# Patient Record
Sex: Male | Born: 1950 | Race: White | Hispanic: No | Marital: Married | Smoking: Former smoker
Health system: Southern US, Community
[De-identification: ages and names within clinical notes are randomized; demographics above are authoritative.]

## PROBLEM LIST (undated history)

## (undated) DIAGNOSIS — I10 Essential (primary) hypertension: Secondary | ICD-10-CM

## (undated) DIAGNOSIS — M1990 Unspecified osteoarthritis, unspecified site: Secondary | ICD-10-CM

## (undated) DIAGNOSIS — E785 Hyperlipidemia, unspecified: Secondary | ICD-10-CM

## (undated) DIAGNOSIS — N138 Other obstructive and reflux uropathy: Secondary | ICD-10-CM

## (undated) DIAGNOSIS — IMO0001 Reserved for inherently not codable concepts without codable children: Secondary | ICD-10-CM

## (undated) DIAGNOSIS — E118 Type 2 diabetes mellitus with unspecified complications: Secondary | ICD-10-CM

## (undated) DIAGNOSIS — N401 Enlarged prostate with lower urinary tract symptoms: Secondary | ICD-10-CM

## (undated) DIAGNOSIS — M24549 Contracture, unspecified hand: Secondary | ICD-10-CM

## (undated) DIAGNOSIS — N400 Enlarged prostate without lower urinary tract symptoms: Secondary | ICD-10-CM

## (undated) DIAGNOSIS — L409 Psoriasis, unspecified: Secondary | ICD-10-CM

## (undated) DIAGNOSIS — R0600 Dyspnea, unspecified: Secondary | ICD-10-CM

## (undated) DIAGNOSIS — H9190 Unspecified hearing loss, unspecified ear: Secondary | ICD-10-CM

## (undated) DIAGNOSIS — Z8619 Personal history of other infectious and parasitic diseases: Secondary | ICD-10-CM

## (undated) DIAGNOSIS — E119 Type 2 diabetes mellitus without complications: Secondary | ICD-10-CM

## (undated) HISTORY — PX: VASECTOMY: SHX75

## (undated) HISTORY — PX: SINUS SURGERY WITH INSTATRAK: SHX5215

## (undated) HISTORY — DX: Hyperlipidemia, unspecified: E78.5

---

## 2006-03-07 ENCOUNTER — Ambulatory Visit: Payer: Self-pay | Admitting: General Surgery

## 2007-02-13 ENCOUNTER — Ambulatory Visit: Payer: Self-pay | Admitting: Specialist

## 2008-03-03 ENCOUNTER — Other Ambulatory Visit: Payer: Self-pay

## 2008-03-03 ENCOUNTER — Ambulatory Visit: Payer: Self-pay

## 2008-12-31 LAB — HM COLONOSCOPY: HM Colonoscopy: NORMAL

## 2011-01-15 ENCOUNTER — Ambulatory Visit: Payer: Self-pay | Admitting: Family Medicine

## 2011-05-08 LAB — LIPID PANEL
CHOLESTEROL: 215 mg/dL — AB (ref 0–200)
HDL: 47 mg/dL (ref 35–70)
LDL CALC: 137 mg/dL
Triglycerides: 154 mg/dL (ref 40–160)

## 2011-05-08 LAB — BASIC METABOLIC PANEL
BUN: 18 mg/dL (ref 4–21)
Creatinine: 0.8 mg/dL (ref <=1.3)

## 2011-06-11 ENCOUNTER — Ambulatory Visit: Payer: Self-pay | Admitting: Internal Medicine

## 2012-09-15 ENCOUNTER — Ambulatory Visit: Payer: Self-pay | Admitting: Internal Medicine

## 2012-09-15 LAB — DOT URINE DIP
Blood: NEGATIVE
Glucose,UR: NEGATIVE mg/dL (ref 0–75)
Protein: NEGATIVE
Specific Gravity: 1.01 (ref 1.003–1.030)

## 2013-02-02 ENCOUNTER — Ambulatory Visit: Payer: Self-pay | Admitting: Neurology

## 2014-03-06 ENCOUNTER — Ambulatory Visit: Payer: Self-pay | Admitting: Internal Medicine

## 2014-12-30 HISTORY — PX: BLEPHAROPLASTY: SUR158

## 2015-02-28 ENCOUNTER — Ambulatory Visit: Payer: Self-pay

## 2015-04-25 LAB — PSA: PSA: 0.9

## 2015-09-24 ENCOUNTER — Other Ambulatory Visit: Payer: Self-pay | Admitting: Internal Medicine

## 2015-09-27 ENCOUNTER — Other Ambulatory Visit: Payer: Self-pay | Admitting: Internal Medicine

## 2015-11-04 ENCOUNTER — Encounter: Payer: Self-pay | Admitting: Internal Medicine

## 2015-11-04 DIAGNOSIS — G4761 Periodic limb movement disorder: Secondary | ICD-10-CM | POA: Insufficient documentation

## 2015-11-04 DIAGNOSIS — N138 Other obstructive and reflux uropathy: Secondary | ICD-10-CM

## 2015-11-04 DIAGNOSIS — M720 Palmar fascial fibromatosis [Dupuytren]: Secondary | ICD-10-CM | POA: Insufficient documentation

## 2015-11-04 DIAGNOSIS — N400 Enlarged prostate without lower urinary tract symptoms: Secondary | ICD-10-CM | POA: Insufficient documentation

## 2015-11-04 DIAGNOSIS — N401 Enlarged prostate with lower urinary tract symptoms: Secondary | ICD-10-CM

## 2015-11-04 DIAGNOSIS — L409 Psoriasis, unspecified: Secondary | ICD-10-CM | POA: Insufficient documentation

## 2015-11-04 DIAGNOSIS — H9190 Unspecified hearing loss, unspecified ear: Secondary | ICD-10-CM | POA: Insufficient documentation

## 2015-11-04 DIAGNOSIS — E785 Hyperlipidemia, unspecified: Secondary | ICD-10-CM

## 2016-03-12 ENCOUNTER — Ambulatory Visit: Payer: Self-pay | Admitting: General Surgery

## 2016-03-21 ENCOUNTER — Ambulatory Visit (INDEPENDENT_AMBULATORY_CARE_PROVIDER_SITE_OTHER): Payer: BLUE CROSS/BLUE SHIELD | Admitting: General Surgery

## 2016-03-21 ENCOUNTER — Encounter: Payer: Self-pay | Admitting: General Surgery

## 2016-03-21 VITALS — BP 168/88 | HR 74 | Resp 14 | Ht 76.0 in | Wt 307.0 lb

## 2016-03-21 DIAGNOSIS — Z1211 Encounter for screening for malignant neoplasm of colon: Secondary | ICD-10-CM

## 2016-03-21 MED ORDER — POLYETHYLENE GLYCOL 3350 17 GM/SCOOP PO POWD: ORAL | Status: DC

## 2016-03-21 NOTE — Patient Instructions (Addendum)
Colonoscopy A colonoscopy is an exam to look at the entire large intestine (colon). This exam can help find problems such as tumors, polyps, inflammation, and areas of bleeding. The exam takes about 1 hour.  LET YOUR HEALTH CARE PROVIDER KNOW ABOUT:   Any allergies you have.  All medicines you are taking, including vitamins, herbs, eye drops, creams, and over-the-counter medicines.  Previous problems you or members of your family have had with the use of anesthetics.  Any blood disorders you have.  Previous surgeries you have had.  Medical conditions you have. RISKS AND COMPLICATIONS  Generally, this is a safe procedure. However, as with any procedure, complications can occur. Possible complications include:  Bleeding.  Tearing or rupture of the colon wall.  Reaction to medicines given during the exam.  Infection (rare). BEFORE THE PROCEDURE   Ask your health care provider about changing or stopping your regular medicines.  You may be prescribed an oral bowel prep. This involves drinking a large amount of medicated liquid, starting the day before your procedure. The liquid will cause you to have multiple loose stools until your stool is almost clear or light green. This cleans out your colon in preparation for the procedure.  Do not eat or drink anything else once you have started the bowel prep, unless your health care provider tells you it is safe to do so.  Arrange for someone to drive you home after the procedure. PROCEDURE   You will be given medicine to help you relax (sedative).  You will lie on your side with your knees bent.  A long, flexible tube with a light and camera on the end (colonoscope) will be inserted through the rectum and into the colon. The camera sends video back to a computer screen as it moves through the colon. The colonoscope also releases carbon dioxide gas to inflate the colon. This helps your health care provider see the area better.  During  the exam, your health care provider may take a small tissue sample (biopsy) to be examined under a microscope if any abnormalities are found.  The exam is finished when the entire colon has been viewed. AFTER THE PROCEDURE   Do not drive for 24 hours after the exam.  You may have a small amount of blood in your stool.  You may pass moderate amounts of gas and have mild abdominal cramping or bloating. This is caused by the gas used to inflate your colon during the exam.  Ask when your test results will be ready and how you will get your results. Make sure you get your test results.   This information is not intended to replace advice given to you by your health care provider. Make sure you discuss any questions you have with your health care provider.   Document Released: 12/13/2000 Document Revised: 10/06/2013 Document Reviewed: 08/23/2013 Elsevier Interactive Patient Education 2016 [employer]vier [employer]  Patient has been scheduled for a colonoscopy on 05-01-16 at [facility]. 

## 2016-03-21 NOTE — Progress Notes (Signed)
Patient ID: Benjamin Medina, male   DOB: 1951-07-24, 65 y.o.   MRN: [id number]  Chief Complaint  Patient presents with  . Colonoscopy    HPI Benjamin Medina is a 65 y.o. male here today for a evaluation of a screening colonoscopy. Last colonoscopy was done 03/07/2006. No GI problems at this time. Bowels move daily no bleeding.  I personally reviewed the patient's history.  HPI  No past medical history on file.  Past Surgical History  Procedure Laterality Date  . Sinus surgery with instatrak    . Blepharoplasty    . Vasectomy    . Colonoscopy  03-07-06    Family History  Problem Relation Age of Onset  . Colon cancer Mother [id number]  . Lymphoma Father     Social History Social History  Substance Use Topics  . Smoking status: Never Smoker   . Smokeless tobacco: Never Used  . Alcohol Use: 2.4 oz/week    4 Standard drinks or equivalent per week     Comment: 2/week    No Known Allergies  Current Outpatient Prescriptions  Medication Sig Dispense Refill  . clobetasol (TEMOVATE) 0.05 % external solution 1 APPLICATION SOLUTION, EXTERNAL 2 TIMES A DAY 100 mL 3  . polyethylene glycol powder (GLYCOLAX/MIRALAX) powder 255 grams one bottle for colonoscopy prep 255 g 0   No current facility-administered medications for this visit.    Review of Systems Review of Systems  Constitutional: Negative.   Respiratory: Negative.   Cardiovascular: Negative.   Gastrointestinal: Negative.     Blood pressure 168/88, pulse 74, resp. rate 14, height 6\' 4"  (1.93 m), weight 307 lb (139.254 kg).  Physical Exam Physical Exam  Constitutional: He is oriented to person, place, and time. He appears well-developed and well-nourished.  HENT:  Mouth/Throat: Oropharynx is clear and moist.  Eyes: Conjunctivae are normal. No scleral icterus.  Neck: Neck supple.  Cardiovascular: Normal rate and regular rhythm.   Murmur heard.  Systolic murmur is present with a grade of 2/6  Pulmonary/Chest: Effort  normal and breath sounds normal.  Abdominal: Normal appearance.  Neurological: He is alert and oriented to person, place, and time.  Skin: Skin is warm and dry.  Psychiatric: His behavior is normal.    Data Reviewed 03/07/2006 colonoscopy was reviewed. Single rectal polyp. Hyperplastic on pathology.  Assessment    Candidate for screening colonoscopy.    Plan        Colonoscopy with possible biopsy/polypectomy prn: Information regarding the procedure, including its potential risks and complications (including but not limited to perforation of the bowel, which may require emergency surgery to repair, and bleeding) was verbally given to the patient. Educational information regarding lower intestinal endoscopy was given to the patient. Written instructions for how to complete the bowel prep using Miralax were provided. The importance of drinking ample fluids to avoid dehydration as a result of the prep emphasized.  Patient [facility].   PCP:  Bari EdwardBerglund, Laura H This information has been scribed by Dorathy DaftMarsha Hatch RNBC. Marland Kitchen.   Earline MayotteByrnett, Manson Luckadoo W 03/22/2016, 1:54 PM

## 2016-03-22 DIAGNOSIS — Z1211 Encounter for screening for malignant neoplasm of colon: Secondary | ICD-10-CM | POA: Insufficient documentation

## 2016-04-29 ENCOUNTER — Telehealth: Payer: Self-pay | Admitting: *Deleted

## 2016-04-29 NOTE — Telephone Encounter (Signed)
Patient contacted today and confirms no medication changes since last office visit. Also, confirms he has Miralax prescription.   We will proceed with colon[facility] as [facility].   This patient was instructed to call the office should he have further questions.

## 2016-04-30 ENCOUNTER — Encounter: Payer: Self-pay | Admitting: *Deleted

## 2016-05-01 ENCOUNTER — Ambulatory Visit: Payer: BLUE CROSS/BLUE SHIELD | Admitting: Anesthesiology

## 2016-05-01 ENCOUNTER — Encounter
Admission: RE | Disposition: A | Discharge status: Home / Self Care | Payer: Self-pay | Source: Ambulatory Visit | Attending: General Surgery

## 2016-05-01 ENCOUNTER — Ambulatory Visit
Admission: RE | Admit: 2016-05-01 | Discharge: 2016-05-01 | Disposition: A | Discharge status: Home / Self Care | Payer: BLUE CROSS/BLUE SHIELD | Source: Ambulatory Visit | Attending: General Surgery | Admitting: General Surgery

## 2016-05-01 ENCOUNTER — Encounter: Payer: Self-pay | Admitting: *Deleted

## 2016-05-01 DIAGNOSIS — Z1211 Encounter for screening for malignant neoplasm of colon: Secondary | ICD-10-CM | POA: Insufficient documentation

## 2016-05-01 HISTORY — PX: COLONOSCOPY WITH PROPOFOL: SHX5780

## 2016-05-01 SURGERY — COLONOSCOPY WITH PROPOFOL
Anesthesia: General

## 2016-05-01 MED ORDER — PROPOFOL 500 MG/50ML IV EMUL
INTRAVENOUS | Status: DC | PRN
  Administered 2016-05-01: 100 ug/kg/min via INTRAVENOUS

## 2016-05-01 MED ORDER — SODIUM CHLORIDE 0.9 % IV SOLN
INTRAVENOUS | Status: DC
  Administered 2016-05-01: 09:00:00 via INTRAVENOUS
  Administered 2016-05-01: 1000 mL via INTRAVENOUS

## 2016-05-01 NOTE — Op Note (Signed)
[facility] Gastroenterology Patient Name: Benjamin Medina Procedure Date: 05/01/2016 9:10 AM MRN: [id number] Account #: [id number] Date of Birth: 1951-08-18 Admit Type: Outpatient Age: 65 Room: [facility] ENDO ROOM 1 Gender: Male Note Status: Finalized Procedure:            Colonoscopy Indications:          Screening for colorectal malignant neoplasm Providers:            Earline Mayotte, MD Referring MD:         Bari Edward, MD (Referring MD) Medicines:            Monitored Anesthesia Care Complications:        No immediate complications. Procedure:            Pre-Anesthesia Assessment:                       - Prior to the procedure, a History and Physical was                        performed, and patient medications, allergies and                        sensitivities were reviewed. The patient's tolerance of                        previous anesthesia was reviewed.                       - The risks and benefits of the procedure and the                        sedation options and risks were discussed with the                        patient. All questions were answered and informed                        consent was obtained.                       After obtaining informed consent, the colonoscope was                        passed under direct vision. Throughout the procedure,                        the patient's blood pressure, pulse, and oxygen                        saturations were monitored continuously. The                        Colonoscope was introduced through the anus and                        advanced to the the terminal ileum. The colonoscopy was                        performed without difficulty. The patient tolerated the  procedure well. The quality of the bowel preparation                        was excellent. Findings:      The entire examined colon appeared normal on direct and retroflexion       views. Impression:            - The entire examined colon is normal on direct and                        retroflexion views.                       - No specimens collected. Recommendation:       - Repeat colonoscopy in 10 years for screening purposes. Procedure Code(s):    --- Professional ---                       [phone], Colonoscopy, flexible; diagnostic, including                        collection of specimen(s) by brushing or washing, when                        performed (separate procedure) Diagnosis Code(s):    --- Professional ---                       Z12.11, Encounter for screening for malignant neoplasm                        of colon CPT copyright 2016 American Medical Association. All rights reserved. The codes documented in this report are preliminary and upon coder review may  be revised to meet current compliance requirements. Earline MayotteJeffrey W. Jakhiya Brower, MD 05/01/2016 9:32:58 AM This report has been signed electronically. Number of Addenda: 0 Note Initiated On: 05/01/2016 9:10 AM Scope Withdrawal Time: 0 hours 6 minutes 21 seconds  Total Procedure Duration: 0 hours 13 minutes 20 seconds       [facility]

## 2016-05-01 NOTE — Transfer of Care (Signed)
Immediate Anesthesia Transfer of Care Note  Patient: Benjamin Medina  Procedure(s) Performed: Procedure(s): COLONOSCOPY WITH PROPOFOL (N/A)  Patient Location: PACU  Anesthesia Type:MAC  Level of Consciousness: awake, alert  and oriented  Airway & Oxygen Therapy: Patient Spontanous Breathing and Patient connected to nasal cannula oxygen  Post-op Assessment: Report given to RN and Post -op Vital signs reviewed and stable  Post vital signs: Reviewed and stable  Last Vitals:  Filed Vitals:   05/01/16 0844  BP: 171/108  Pulse: 68  Temp: 36.5 C  Resp: 16    Last Pain: There were no vitals filed for this visit.       Complications: No apparent anesthesia complications

## 2016-05-01 NOTE — H&P (Signed)
Joelene MillinKenneth A Hobbins [id number] [birth year]     HPI: Healthy 65 y/o male for a screening colonoscopy. No change in clinical exam since office visit in March. Tolerated prep well.   Prescriptions prior to admission  Medication Sig Dispense Refill Last Dose  . clobetasol (TEMOVATE) 0.05 % external solution 1 APPLICATION SOLUTION, EXTERNAL 2 TIMES A DAY 100 mL 3 Taking  . polyethylene glycol powder (GLYCOLAX/MIRALAX) powder 255 grams one bottle for colonoscopy prep 255 g 0    No Known Allergies Past Medical History  Diagnosis Date  . Medical history non-contributory    Past Surgical History  Procedure Laterality Date  . Sinus surgery with instatrak    . Blepharoplasty    . Vasectomy    . Colonoscopy  03-07-06   Social History   Social History  . Marital Status: Married    Spouse Name: N/A  . Number of Children: N/A  . Years of Education: N/A   Occupational History  . Not on file.   Social History Main Topics  . Smoking status: Never Smoker   . Smokeless tobacco: Never Used  . Alcohol Use: 2.4 oz/week    4 Standard drinks or equivalent per week     Comment: 2/week  . Drug Use: No  . Sexual Activity: Not on file   Other Topics Concern  . Not on file   Social History Narrative   Social History   Social History Narrative     ROS: Negative.     PE: HEENT: Negative. Lungs: Clear. Cardio: RR. Assessment/Plan:  Proceed with planned endoscopy.  Earline MayotteByrnett, Kaylanie Capili W 05/01/2016

## 2016-05-01 NOTE — Anesthesia Postprocedure Evaluation (Signed)
Anesthesia Post Note  Patient: Benjamin Medina  Procedure(s) Performed: Procedure(s) (LRB): COLONOSCOPY WITH PROPOFOL (N/A)  Patient location during evaluation: Endoscopy Anesthesia Type: General Level of consciousness: awake and alert Pain management: pain level controlled Vital Signs Assessment: post-procedure vital signs reviewed and stable Respiratory status: spontaneous breathing, nonlabored ventilation, respiratory function stable and patient connected to nasal cannula oxygen Cardiovascular status: blood pressure returned to baseline and stable Postop Assessment: no signs of nausea or vomiting Anesthetic complications: no    Last Vitals:  Filed Vitals:   05/01/16 0953 05/01/16 1003  BP: 139/93 151/94  Pulse: 64 61  Temp:    Resp: 18 16    Last Pain: There were no vitals filed for this visit.               Lenard SimmerAndrew Sasuke Yaffe

## 2016-05-01 NOTE — Anesthesia Preprocedure Evaluation (Signed)
Anesthesia Evaluation  Patient identified by MRN, date of birth, ID band Patient awake    Reviewed: Allergy & Precautions, H&P , NPO status , Patient's Chart, lab work & pertinent test results, reviewed documented beta blocker date and time   History of Anesthesia Complications Negative for: history of anesthetic complications  Airway Mallampati: II  TM Distance: >3 FB Neck ROM: full    Dental no notable dental hx. (+) Teeth Intact, Missing   Pulmonary neg pulmonary ROS,    Pulmonary exam normal breath sounds clear to auscultation       Cardiovascular Exercise Tolerance: Good negative cardio ROS Normal cardiovascular exam Rhythm:regular Rate:Normal     Neuro/Psych negative neurological ROS  negative psych ROS   GI/Hepatic negative GI ROS, Neg liver ROS,   Endo/Other  negative endocrine ROS  Renal/GU negative Renal ROS  negative genitourinary   Musculoskeletal   Abdominal   Peds  Hematology negative hematology ROS (+)   Anesthesia Other Findings Past Medical History:   Medical history non-contributory                             Reproductive/Obstetrics negative OB ROS                             Anesthesia Physical Anesthesia Plan  ASA: I  Anesthesia Plan: General   Post-op Pain Management:    Induction:   Airway Management Planned:   Additional Equipment:   Intra-op Plan:   Post-operative Plan:   Informed Consent: I have reviewed the patients History and Physical, chart, labs and discussed the procedure including the risks, benefits and alternatives for the proposed anesthesia with the patient or authorized representative who has indicated his/her understanding and acceptance.   Dental Advisory Given  Plan Discussed with: Anesthesiologist, CRNA and Surgeon  Anesthesia Plan Comments:         Anesthesia Quick Evaluation

## 2016-05-02 ENCOUNTER — Ambulatory Visit: Payer: Self-pay | Admitting: Internal Medicine

## 2016-05-03 ENCOUNTER — Encounter: Payer: Self-pay | Admitting: General Surgery

## 2016-05-30 ENCOUNTER — Encounter: Payer: Self-pay | Admitting: General Surgery

## 2016-06-09 ENCOUNTER — Other Ambulatory Visit: Payer: Self-pay | Admitting: Internal Medicine

## 2016-06-11 NOTE — Telephone Encounter (Signed)
pts coming in tomorrow at 10:30 for his cpe

## 2016-06-12 ENCOUNTER — Encounter: Payer: Self-pay | Admitting: Internal Medicine

## 2016-06-12 ENCOUNTER — Ambulatory Visit (INDEPENDENT_AMBULATORY_CARE_PROVIDER_SITE_OTHER): Payer: BLUE CROSS/BLUE SHIELD | Admitting: Internal Medicine

## 2016-06-12 VITALS — BP 136/82 | HR 64 | Resp 16 | Ht 75.0 in | Wt 306.0 lb

## 2016-06-12 DIAGNOSIS — E785 Hyperlipidemia, unspecified: Secondary | ICD-10-CM | POA: Diagnosis not present

## 2016-06-12 DIAGNOSIS — N401 Enlarged prostate with lower urinary tract symptoms: Secondary | ICD-10-CM

## 2016-06-12 DIAGNOSIS — R030 Elevated blood-pressure reading, without diagnosis of hypertension: Secondary | ICD-10-CM | POA: Diagnosis not present

## 2016-06-12 DIAGNOSIS — IMO0001 Reserved for inherently not codable concepts without codable children: Secondary | ICD-10-CM

## 2016-06-12 DIAGNOSIS — L409 Psoriasis, unspecified: Secondary | ICD-10-CM

## 2016-06-12 DIAGNOSIS — N138 Other obstructive and reflux uropathy: Secondary | ICD-10-CM

## 2016-06-12 DIAGNOSIS — Z0000 Encounter for general adult medical examination without abnormal findings: Secondary | ICD-10-CM

## 2016-06-12 DIAGNOSIS — L401 Generalized pustular psoriasis: Secondary | ICD-10-CM | POA: Diagnosis not present

## 2016-06-12 LAB — POCT URINALYSIS DIPSTICK
BILIRUBIN UA: NEGATIVE
Blood, UA: NEGATIVE
GLUCOSE UA: NEGATIVE
KETONES UA: NEGATIVE
LEUKOCYTES UA: NEGATIVE
Nitrite, UA: NEGATIVE
Protein, UA: NEGATIVE
Spec Grav, UA: 1.01
Urobilinogen, UA: 0.2
pH, UA: 6.5

## 2016-06-12 MED ORDER — CLOBETASOL PROPIONATE 0.05 % EX GEL: 1.0000 "application " | Freq: Two times a day (BID) | CUTANEOUS | Status: DC

## 2016-06-12 MED ORDER — CLOBETASOL PROPIONATE 0.05 % EX SOLN: CUTANEOUS | Status: DC

## 2016-06-12 NOTE — Progress Notes (Signed)
Date:  06/12/2016   Name:  Benjamin Medina   DOB:  December 22, 1951   MRN:  [id number]   Chief Complaint: Annual Exam Benjamin Medina is a 65 y.o. male who presents today for his Complete Annual Exam. He feels well. He reports exercising playing golf, and active at work. He reports he is sleeping fairly well. He has nocturia 1-2 times per night. He has been seen by Urology with no significant abnormality noted.  He is not interested in medication at this time. He plans to start a weight loss diet and begin exercising more often.   Review of Systems  Constitutional: Negative for chills, diaphoresis, appetite change, fatigue and unexpected weight change.  HENT: Negative for hearing loss, tinnitus, trouble swallowing and voice change.   Eyes: Negative for visual disturbance.  Respiratory: Negative for choking, shortness of breath and wheezing.   Cardiovascular: Negative for chest pain, palpitations and leg swelling.  Gastrointestinal: Negative for abdominal pain, diarrhea, constipation and blood in stool.  Endocrine: Negative for polydipsia and polyuria.  Genitourinary: Negative for dysuria, frequency, hematuria and difficulty urinating.  Musculoskeletal: Negative for myalgias, back pain and arthralgias.  Skin: Positive for rash. Negative for color change.  Neurological: Negative for dizziness, syncope and headaches.  Hematological: Negative for adenopathy.  Psychiatric/Behavioral: Negative for sleep disturbance and dysphoric mood.    Patient Active Problem List   Diagnosis Date Noted  . Auditory impairment 11/04/2015  . 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 Blood pressure elevated 11/04/2015  . Generalized psoriasis 11/04/2015  . Periodic limb movement 11/04/2015  . Hyperlipidemia 11/04/2015    Prior to Admission medications   Medication Sig Start Date End Date Taking? Authorizing Provider  clobetasol  (TEMOVATE) 0.05 % external solution 1 APPLICATION SOLUTION, EXTERNAL 2 TIMES A DAY 06/09/16  Yes Reubin MilanLaura H Chevy Sweigert, MD  clobetasol (TEMOVATE) 0.05 % GEL APPLY TO THE AFFECTED AREA(S) 2 TIMES A DAY 06/09/16  Yes Reubin MilanLaura H Zamira Hickam, MD  ibuprofen (GOODSENSE IBUPROFEN) 200 MG tablet Take by mouth.   Yes Historical Provider, MD  meloxicam (MOBIC) 7.5 MG tablet TAKE 1 TABLET (7.5 MG TOTAL) BY MOUTH ONCE DAILY. 05/29/16  Yes Historical Provider, MD  polyethylene glycol powder (GLYCOLAX/MIRALAX) powder  03/21/16  Yes Historical Provider, MD    No Known Allergies  Past Surgical History  Procedure Laterality Date  . Sinus surgery with instatrak    . Blepharoplasty  2016  . Vasectomy    . Colonoscopy with propofol N/A 05/01/2016    Procedure: COLONOSCOPY WITH PROPOFOL;  Surgeon: Earline MayotteJeffrey W Byrnett, MD;  [facility];  Service: Endoscopy;  Laterality: N/A;    Social History  Substance Use Topics  . Smoking status: Never Smoker   . Smokeless tobacco: Never Used  . Alcohol Use: 2.4 oz/week    4 Standard drinks or equivalent per week     Comment: 2/week     Medication list has been reviewed and updated.   Physical Exam  Constitutional: He is oriented to person, place, and time. He appears well-developed. No distress.  HENT:  Head: Normocephalic and atraumatic.  Neck: Normal range of motion. Neck supple. No thyromegaly present.  Cardiovascular: Normal rate, regular rhythm and normal heart sounds.   Pulmonary/Chest: Effort normal and breath sounds normal. No respiratory distress. He has no wheezes. He exhibits no tenderness.  Abdominal: Soft. Normal appearance and bowel sounds are normal. There is no hepatosplenomegaly. There is no  tenderness. There is no CVA tenderness.  Musculoskeletal: Normal range of motion. He exhibits no edema or tenderness.       Right knee: He exhibits normal range of motion, no swelling and no effusion.       Left knee: He exhibits normal range of motion, no  swelling and no effusion.  Lymphadenopathy:    He has no cervical adenopathy.  Neurological: He is alert and oriented to person, place, and time. He has normal reflexes.  Skin: Skin is warm and dry. No rash noted.  Patches of psoriasis in both axilla and on left flank.  Psychiatric: He has a normal mood and affect. His speech is normal and behavior is normal. Thought content normal.  Nursing note and vitals reviewed.   BP 136/82 mmHg  Pulse 64  Resp 16  Ht  (1.905 m)  Wt 306 lb (138.801 kg)  BMI 38.25 kg/m2  SpO2 99%  Assessment and Plan: 1. Annual physical exam Recommend regular exercise and dietary changes - POCT urinalysis dipstick  2. Blood pressure elevated Restrict sodium and work on weight loss/exercise - CBC with Differential/Platelet - Comprehensive metabolic panel - TSH  3. Hyperlipidemia Will advise if medication is needed - Lipid panel  4. Benign prostatic hyperplasia with urinary obstruction DRE declined by patient - PSA  5. Generalized psoriasis - clobetasol (TEMOVATE) 0.05 % GEL; Apply 1 application topically 2 (two) times daily.  Dispense: 60 each; Refill: 12 - clobetasol (TEMOVATE) 0.05 % external solution; 1 APPLICATION SOLUTION, EXTERNAL 2 TIMES A DAY  Dispense: 100 mL; Refill: 12   Bari Edward, MD [facility]  06/12/2016

## 2016-06-12 NOTE — Patient Instructions (Signed)
DASH Eating Plan  DASH stands for "Dietary Approaches to Stop Hypertension." The DASH eating plan is a healthy eating plan that has been shown to reduce high blood pressure (hypertension). Additional health benefits may include reducing the risk of type 2 diabetes mellitus, heart disease, and stroke. The DASH eating plan may also help with weight loss.  WHAT DO I NEED TO KNOW ABOUT THE DASH EATING PLAN?  For the DASH eating plan, you will follow these general guidelines:  · Choose foods with a percent daily value for sodium of less than 5% (as listed on the food label).  · Use salt-free seasonings or herbs instead of table salt or sea salt.  · Check with your health care provider or pharmacist before using salt substitutes.  · Eat lower-sodium products, often labeled as "lower sodium" or "no salt added."  · Eat fresh foods.  · Eat more vegetables, fruits, and low-fat dairy products.  · Choose whole grains. Look for the word "whole" as the first word in the ingredient list.  · Choose fish and skinless chicken or turkey more often than red meat. Limit fish, poultry, and meat to 6 oz (170 g) each day.  · Limit sweets, desserts, sugars, and sugary drinks.  · Choose heart-healthy fats.  · Limit cheese to 1 oz (28 g) per day.  · Eat more home-cooked food and less restaurant, buffet, and fast food.  · Limit fried foods.  · Cook foods using methods other than frying.  · Limit canned vegetables. If you do use them, rinse them well to decrease the sodium.  · When eating at a restaurant, ask that your food be prepared with less salt, or no salt if possible.  WHAT FOODS CAN I EAT?  Seek help from a dietitian for individual calorie needs.  Grains  Whole grain or whole wheat bread. Brown rice. Whole grain or whole wheat pasta. Quinoa, bulgur, and whole grain cereals. Low-sodium cereals. Corn or whole wheat flour tortillas. Whole grain cornbread. Whole grain crackers. Low-sodium crackers.  Vegetables  Fresh or frozen vegetables  (raw, steamed, roasted, or grilled). Low-sodium or reduced-sodium tomato and vegetable juices. Low-sodium or reduced-sodium tomato sauce and paste. Low-sodium or reduced-sodium canned vegetables.   Fruits  All fresh, canned (in natural juice), or frozen fruits.  Meat and Other Protein Products  Ground beef (85% or leaner), grass-fed beef, or beef trimmed of fat. Skinless chicken or turkey. Ground chicken or turkey. Pork trimmed of fat. All fish and seafood. Eggs. Dried beans, peas, or lentils. Unsalted nuts and seeds. Unsalted canned beans.  Dairy  Low-fat dairy products, such as skim or 1% milk, 2% or reduced-fat cheeses, low-fat ricotta or cottage cheese, or plain low-fat yogurt. Low-sodium or reduced-sodium cheeses.  Fats and Oils  Tub margarines without trans fats. Light or reduced-fat mayonnaise and salad dressings (reduced sodium). Avocado. Safflower, olive, or canola oils. Natural peanut or almond butter.  Other  Unsalted popcorn and pretzels.  The items listed above may not be a complete list of recommended foods or beverages. Contact your dietitian for more options.  WHAT FOODS ARE NOT RECOMMENDED?  Grains  White bread. White pasta. White rice. Refined cornbread. Bagels and croissants. Crackers that contain trans fat.  Vegetables  Creamed or fried vegetables. Vegetables in a cheese sauce. Regular canned vegetables. Regular canned tomato sauce and paste. Regular tomato and vegetable juices.  Fruits  Dried fruits. Canned fruit in light or heavy syrup. Fruit juice.  Meat and Other Protein   Products  Fatty cuts of meat. Ribs, chicken wings, bacon, sausage, bologna, salami, chitterlings, fatback, hot dogs, bratwurst, and packaged luncheon meats. Salted nuts and seeds. Canned beans with salt.  Dairy  Whole or 2% milk, cream, half-and-half, and cream cheese. Whole-fat or sweetened yogurt. Full-fat cheeses or blue cheese. Nondairy creamers and whipped toppings. Processed cheese, cheese spreads, or cheese  curds.  Condiments  Onion and garlic salt, seasoned salt, table salt, and sea salt. Canned and packaged gravies. Worcestershire sauce. Tartar sauce. Barbecue sauce. Teriyaki sauce. Soy sauce, including reduced sodium. Steak sauce. Fish sauce. Oyster sauce. Cocktail sauce. Horseradish. Ketchup and mustard. Meat flavorings and tenderizers. Bouillon cubes. Hot sauce. Tabasco sauce. Marinades. Taco seasonings. Relishes.  Fats and Oils  Butter, stick margarine, lard, shortening, ghee, and bacon fat. Coconut, palm kernel, or palm oils. Regular salad dressings.  Other  Pickles and olives. Salted popcorn and pretzels.  The items listed above may not be a complete list of foods and beverages to avoid. Contact your dietitian for more information.  WHERE CAN I FIND MORE INFORMATION?  National Heart, Lung, and Blood Institute: www.nhlbi.nih.gov/health/health-topics/topics/dash/     This information is not intended to replace advice given to you by your health care provider. Make sure you discuss any questions you have with your health care provider.     Document Released: 12/05/2011 Document Revised: 01/06/2015 Document Reviewed: 10/20/2013  Elsevier Interactive Patient Education ©2016 Elsevier Inc.

## 2016-06-13 LAB — LIPID PANEL
CHOL/HDL RATIO: 4.3 ratio (ref 0.0–5.0)
CHOLESTEROL TOTAL: 215 mg/dL — AB (ref 100–199)
HDL: 50 mg/dL (ref >39)
LDL CALC: 141 mg/dL — AB (ref 0–99)
TRIGLYCERIDES: 118 mg/dL (ref 0–149)
VLDL CHOLESTEROL CAL: 24 mg/dL (ref 5–40)

## 2016-06-13 LAB — CBC WITH DIFFERENTIAL/PLATELET
BASOS ABS: 0 10*3/uL (ref 0.0–0.2)
Basos: 0 %
EOS (ABSOLUTE): 0.1 10*3/uL (ref 0.0–0.4)
EOS: 2 %
HEMATOCRIT: 42.9 % (ref 37.5–51.0)
HEMOGLOBIN: 14.2 g/dL (ref 12.6–17.7)
IMMATURE GRANULOCYTES: 0 %
Immature Grans (Abs): 0 10*3/uL (ref 0.0–0.1)
LYMPHS ABS: 2.1 10*3/uL (ref 0.7–3.1)
Lymphs: 31 %
MCH: 31.6 pg (ref 26.6–33.0)
MCHC: 33.1 g/dL (ref 31.5–35.7)
MCV: 95 fL (ref 79–97)
MONOS ABS: 0.6 10*3/uL (ref 0.1–0.9)
Monocytes: 9 %
NEUTROS ABS: 4 10*3/uL (ref 1.4–7.0)
Neutrophils: 58 %
Platelets: 281 10*3/uL (ref 150–379)
RBC: 4.5 x10E6/uL (ref 4.14–5.80)
RDW: 14.1 % (ref 12.3–15.4)
WBC: 6.9 10*3/uL (ref 3.4–10.8)

## 2016-06-13 LAB — COMPREHENSIVE METABOLIC PANEL
ALK PHOS: 58 IU/L (ref 39–117)
ALT: 38 IU/L (ref 0–44)
AST: 19 IU/L (ref 0–40)
Albumin/Globulin Ratio: 1.5 (ref 1.2–2.2)
Albumin: 4.6 g/dL (ref 3.6–4.8)
BUN / CREAT RATIO: 16 (ref 10–24)
BUN: 16 mg/dL (ref 8–27)
Bilirubin Total: 0.3 mg/dL (ref 0.0–1.2)
CALCIUM: 9.8 mg/dL (ref 8.6–10.2)
CO2: 21 mmol/L (ref 18–29)
CREATININE: 1.01 mg/dL (ref 0.76–1.27)
Chloride: 99 mmol/L (ref 96–106)
GFR calc Af Amer: 91 mL/min/{1.73_m2} (ref >59)
GFR, EST NON AFRICAN AMERICAN: 79 mL/min/{1.73_m2} (ref >59)
GLOBULIN, TOTAL: 3.1 g/dL (ref 1.5–4.5)
GLUCOSE: 88 mg/dL (ref 65–99)
Potassium: 4.5 mmol/L (ref 3.5–5.2)
SODIUM: 141 mmol/L (ref 134–144)
Total Protein: 7.7 g/dL (ref 6.0–8.5)

## 2016-06-13 LAB — TSH: TSH: 1.04 u[IU]/mL (ref 0.450–4.500)

## 2016-06-13 LAB — PSA: Prostate Specific Ag, Serum: 1 ng/mL (ref 0.0–4.0)

## 2016-09-16 ENCOUNTER — Other Ambulatory Visit: Payer: Self-pay | Admitting: Orthopedic Surgery

## 2016-09-16 DIAGNOSIS — M25561 Pain in right knee: Secondary | ICD-10-CM

## 2016-09-16 DIAGNOSIS — M2391 Unspecified internal derangement of right knee: Secondary | ICD-10-CM

## 2016-10-01 ENCOUNTER — Ambulatory Visit: Admission: RE | Admit: 2016-10-01 | Payer: BLUE CROSS/BLUE SHIELD | Source: Ambulatory Visit

## 2016-10-09 ENCOUNTER — Ambulatory Visit (HOSPITAL_COMMUNITY): Payer: BLUE CROSS/BLUE SHIELD

## 2016-10-15 ENCOUNTER — Ambulatory Visit: Payer: BLUE CROSS/BLUE SHIELD

## 2016-10-17 ENCOUNTER — Ambulatory Visit
Admission: RE | Admit: 2016-10-17 | Discharge: 2016-10-17 | Disposition: A | Discharge status: Home / Self Care | Payer: BLUE CROSS/BLUE SHIELD | Source: Ambulatory Visit | Attending: Orthopedic Surgery | Admitting: Orthopedic Surgery

## 2016-10-17 DIAGNOSIS — X58XXXA Exposure to other specified factors, initial encounter: Secondary | ICD-10-CM | POA: Diagnosis not present

## 2016-10-17 DIAGNOSIS — S83241A Other tear of medial meniscus, current injury, right knee, initial encounter: Secondary | ICD-10-CM | POA: Diagnosis not present

## 2016-10-17 DIAGNOSIS — M25561 Pain in right knee: Secondary | ICD-10-CM | POA: Diagnosis present

## 2016-10-17 DIAGNOSIS — M2241 Chondromalacia patellae, right knee: Secondary | ICD-10-CM | POA: Diagnosis not present

## 2016-10-17 DIAGNOSIS — M2391 Unspecified internal derangement of right knee: Secondary | ICD-10-CM

## 2016-11-06 DIAGNOSIS — S83231A Complex tear of medial meniscus, current injury, right knee, initial encounter: Secondary | ICD-10-CM | POA: Insufficient documentation

## 2016-11-06 DIAGNOSIS — M1711 Unilateral primary osteoarthritis, right knee: Secondary | ICD-10-CM | POA: Insufficient documentation

## 2016-11-18 ENCOUNTER — Encounter: Payer: Self-pay | Admitting: *Deleted

## 2016-11-25 ENCOUNTER — Ambulatory Visit (INDEPENDENT_AMBULATORY_CARE_PROVIDER_SITE_OTHER): Payer: BLUE CROSS/BLUE SHIELD | Admitting: Internal Medicine

## 2016-11-25 ENCOUNTER — Encounter: Payer: Self-pay | Admitting: Internal Medicine

## 2016-11-25 VITALS — BP 122/84 | HR 74 | Resp 16 | Ht 75.0 in | Wt 309.0 lb

## 2016-11-25 DIAGNOSIS — H6122 Impacted cerumen, left ear: Secondary | ICD-10-CM | POA: Diagnosis not present

## 2016-11-25 NOTE — Progress Notes (Signed)
Date:  11/25/2016   Name:  Benjamin Medina   DOB:  06/22/1951   MRN:  [id number]   Chief Complaint: Ear Pain (Left ear pain x 1 week ) Otalgia   There is pain in the left ear. This is a new problem. The current episode started in the past 7 days. The problem has been unchanged. Associated symptoms include hearing loss. Pertinent negatives include no coughing, ear discharge, rash or vomiting. He has tried nothing for the symptoms.    Review of Systems  HENT: Positive for ear pain and hearing loss. Negative for ear discharge, facial swelling, sinus pain, sinus pressure and tinnitus.   Eyes: Negative for visual disturbance.  Respiratory: Negative for cough.   Cardiovascular: Negative for chest pain and palpitations.  Gastrointestinal: Negative for vomiting.  Skin: Negative for rash.  Neurological: Negative for dizziness.    Patient Active Problem List   Diagnosis Date Noted  . Complex tear of medial meniscus of right knee as current injury 11/06/2016  . Primary osteoarthritis of right knee 11/06/2016  . Auditory impairment 11/04/2015  . 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 Blood pressure elevated 11/04/2015  . Generalized psoriasis 11/04/2015  . Periodic limb movement 11/04/2015  . Hyperlipidemia 11/04/2015    Prior to Admission medications   Medication Sig Start Date End Date Taking? Authorizing Provider  clobetasol (TEMOVATE) 0.05 % GEL Apply 1 application topically 2 (two) times daily. 06/12/16  Yes Reubin Milan, MD  ibuprofen (GOODSENSE IBUPROFEN) 200 MG tablet Take by mouth.   Yes Historical Provider, MD  meloxicam (MOBIC) 7.5 MG tablet TAKE 1 TABLET (7.5 MG TOTAL) BY MOUTH ONCE DAILY. 05/29/16  Yes Historical Provider, MD  traMADol (ULTRAM) 50 MG tablet Take 50 mg by mouth every 6 (six) hours as needed.   Yes Historical Provider, MD    No Known Allergies  Past Surgical History:  Procedure  Laterality Date  . BLEPHAROPLASTY  2016  . COLONOSCOPY WITH PROPOFOL N/A 05/01/2016   Procedure: COLONOSCOPY WITH PROPOFOL;  Surgeon: Earline Mayotte, MD;  Location: [facility] ENDOSCOPY;  Service: Endoscopy;  Laterality: N/A;  . SINUS SURGERY WITH INSTATRAK    . VASECTOMY      Social History  Substance Use Topics  . Smoking status: Former Smoker    Quit date: 12/30/1985  . Smokeless tobacco: Never Used  . Alcohol use 2.4 oz/week    4 Standard drinks or equivalent per week     Comment:       Medication list has been reviewed and updated.   Physical Exam  Constitutional: He is oriented to person, place, and time. He appears well-developed. No distress.  HENT:  Head: Normocephalic and atraumatic.  Cerumen impaction on left - irrigated thoroughly with small amount of cerumen released.  Ear curette used to remove the remaining cerumen.  Ear canal clear afterwards. Mild erythema but no bleeding.  Neck: Normal range of motion. Neck supple.  Cardiovascular: Normal rate, regular rhythm and normal heart sounds.   Pulmonary/Chest: Effort normal. No respiratory distress. He has no wheezes.  Musculoskeletal: Normal range of motion.  Neurological: He is alert and oriented to person, place, and time.  Skin: Skin is warm and dry. No rash noted.  Psychiatric: He has a normal mood and affect. His behavior is normal. Thought content normal.  Nursing note and vitals reviewed.   BP 122/84   Pulse 74   Resp 16  Ht 6\' 3"  (1.905 m)   Wt (!) 309 lb (140.2 kg)   BMI 38.62 kg/m   Assessment and Plan: 1. Hearing loss of left ear due to cerumen impaction Cerumen removed successfully with irrigation and curette Recommend 2 drops of H2O2 daily for three days   Bari EdwardLaura Camdynn Maranto, MD [facility]  11/25/2016

## 2016-11-27 ENCOUNTER — Encounter
Admission: RE | Disposition: A | Discharge status: Home / Self Care | Payer: Self-pay | Source: Ambulatory Visit | Attending: Surgery

## 2016-11-27 ENCOUNTER — Ambulatory Visit: Payer: BLUE CROSS/BLUE SHIELD | Admitting: Anesthesiology

## 2016-11-27 ENCOUNTER — Ambulatory Visit
Admission: RE | Admit: 2016-11-27 | Discharge: 2016-11-27 | Disposition: A | Discharge status: Home / Self Care | Payer: BLUE CROSS/BLUE SHIELD | Source: Ambulatory Visit | Attending: Surgery | Admitting: Surgery

## 2016-11-27 DIAGNOSIS — Z791 Long term (current) use of non-steroidal anti-inflammatories (NSAID): Secondary | ICD-10-CM | POA: Insufficient documentation

## 2016-11-27 DIAGNOSIS — W07XXXA Fall from chair, initial encounter: Secondary | ICD-10-CM | POA: Insufficient documentation

## 2016-11-27 DIAGNOSIS — Z87891 Personal history of nicotine dependence: Secondary | ICD-10-CM | POA: Insufficient documentation

## 2016-11-27 DIAGNOSIS — M1711 Unilateral primary osteoarthritis, right knee: Secondary | ICD-10-CM | POA: Insufficient documentation

## 2016-11-27 DIAGNOSIS — S83231A Complex tear of medial meniscus, current injury, right knee, initial encounter: Secondary | ICD-10-CM | POA: Diagnosis not present

## 2016-11-27 DIAGNOSIS — Z6838 Body mass index (BMI) 38.0-38.9, adult: Secondary | ICD-10-CM | POA: Insufficient documentation

## 2016-11-27 DIAGNOSIS — L409 Psoriasis, unspecified: Secondary | ICD-10-CM | POA: Insufficient documentation

## 2016-11-27 DIAGNOSIS — E669 Obesity, unspecified: Secondary | ICD-10-CM | POA: Diagnosis not present

## 2016-11-27 DIAGNOSIS — M2241 Chondromalacia patellae, right knee: Secondary | ICD-10-CM | POA: Diagnosis not present

## 2016-11-27 HISTORY — PX: KNEE ARTHROSCOPY WITH MEDIAL MENISECTOMY: SHX5651

## 2016-11-27 SURGERY — ARTHROSCOPY, KNEE, WITH MEDIAL MENISCECTOMY
Anesthesia: General | Laterality: Right | Wound class: Clean

## 2016-11-27 MED ORDER — MELOXICAM 7.5 MG PO TABS
7.5000 mg | ORAL_TABLET | Freq: Every day | ORAL | 3 refills | Status: DC
Start: 2016-11-27 — End: 2019-01-14

## 2016-11-27 MED ORDER — HYDROCODONE-ACETAMINOPHEN 5-325 MG PO TABS: 1.0000 | ORAL_TABLET | Freq: Four times a day (QID) | ORAL | 0 refills | Status: DC | PRN

## 2016-11-27 MED ORDER — DEXAMETHASONE SODIUM PHOSPHATE 4 MG/ML IJ SOLN
INTRAMUSCULAR | Status: DC | PRN
  Administered 2016-11-27: 8 mg via INTRAVENOUS

## 2016-11-27 MED ORDER — FENTANYL CITRATE (PF) 100 MCG/2ML IJ SOLN
INTRAMUSCULAR | Status: DC | PRN
  Administered 2016-11-27: 100 ug via INTRAVENOUS

## 2016-11-27 MED ORDER — MIDAZOLAM HCL 5 MG/5ML IJ SOLN
INTRAMUSCULAR | Status: DC | PRN
  Administered 2016-11-27: 2 mg via INTRAVENOUS

## 2016-11-27 MED ORDER — FENTANYL CITRATE (PF) 100 MCG/2ML IJ SOLN: 25.0000 ug | INTRAMUSCULAR | Status: DC | PRN

## 2016-11-27 MED ORDER — OXYCODONE HCL 5 MG PO TABS: 5.0000 mg | ORAL_TABLET | Freq: Once | ORAL | Status: DC | PRN

## 2016-11-27 MED ORDER — LACTATED RINGERS IV SOLN
INTRAVENOUS | Status: DC
  Administered 2016-11-27: 13:00:00 via INTRAVENOUS

## 2016-11-27 MED ORDER — OXYCODONE HCL 5 MG/5ML PO SOLN: 5.0000 mg | Freq: Once | ORAL | Status: DC | PRN

## 2016-11-27 MED ORDER — ONDANSETRON HCL 4 MG/2ML IJ SOLN
INTRAMUSCULAR | Status: DC | PRN
  Administered 2016-11-27: 4 mg via INTRAVENOUS

## 2016-11-27 MED ORDER — LIDOCAINE HCL 1 % IJ SOLN
INTRAMUSCULAR | Status: DC | PRN
  Administered 2016-11-27: 60 mL via INTRAMUSCULAR

## 2016-11-27 MED ORDER — ONDANSETRON HCL 4 MG/2ML IJ SOLN: 4.0000 mg | Freq: Once | INTRAMUSCULAR | Status: DC | PRN

## 2016-11-27 MED ORDER — PROPOFOL 10 MG/ML IV BOLUS
INTRAVENOUS | Status: DC | PRN
  Administered 2016-11-27: 200 mg via INTRAVENOUS

## 2016-11-27 MED ORDER — LIDOCAINE HCL (CARDIAC) 20 MG/ML IV SOLN
INTRAVENOUS | Status: DC | PRN
  Administered 2016-11-27: 40 mg via INTRATRACHEAL

## 2016-11-27 MED ORDER — GLYCOPYRROLATE 0.2 MG/ML IJ SOLN
INTRAMUSCULAR | Status: DC | PRN
  Administered 2016-11-27: 0.2 mg via INTRAVENOUS

## 2016-11-27 MED ORDER — BUPIVACAINE-EPINEPHRINE (PF) 0.5% -1:200000 IJ SOLN
INTRAMUSCULAR | Status: DC | PRN
  Administered 2016-11-27: 20 mL

## 2016-11-27 MED ORDER — DEXTROSE 5 % IV SOLN
3.0000 g | Freq: Once | INTRAVENOUS | Status: AC
  Administered 2016-11-27: 3 g via INTRAVENOUS

## 2016-11-27 SURGICAL SUPPLY — 30 items
BANDAGE ELASTIC 6 LF NS (GAUZE/BANDAGES/DRESSINGS) ×3 IMPLANT
BLADE FULL RADIUS 3.5 (BLADE) ×3 IMPLANT
BUR ACROMIONIZER 4.0 (BURR) IMPLANT
CHLORAPREP W/TINT 26ML (MISCELLANEOUS) ×3 IMPLANT
COVER LIGHT HANDLE UNIVERSAL (MISCELLANEOUS) ×6 IMPLANT
CUFF TOURN SGL QUICK 30 (MISCELLANEOUS) ×2
CUFF TOURN SGL QUICK 34 (TOURNIQUET CUFF)
CUFF TRNQT CYL 34X4X40X1 (TOURNIQUET CUFF) IMPLANT
CUFF TRNQT CYL LO 30X4X (MISCELLANEOUS) ×1 IMPLANT
DRAPE IMP U-DRAPE 54X76 (DRAPES) ×3 IMPLANT
GAUZE SPONGE 4X4 12PLY STRL (GAUZE/BANDAGES/DRESSINGS) ×3 IMPLANT
GLOVE BIO SURGEON STRL SZ8 (GLOVE) ×6 IMPLANT
GLOVE INDICATOR 8.0 STRL GRN (GLOVE) ×3 IMPLANT
GOWN STRL REUS W/ TWL LRG LVL3 (GOWN DISPOSABLE) ×1 IMPLANT
GOWN STRL REUS W/ TWL XL LVL3 (GOWN DISPOSABLE) ×1 IMPLANT
GOWN STRL REUS W/TWL LRG LVL3 (GOWN DISPOSABLE) ×2
GOWN STRL REUS W/TWL XL LVL3 (GOWN DISPOSABLE) ×2
IV LACTATED RINGER IRRG 3000ML (IV SOLUTION) ×4
IV LR IRRIG 3000ML ARTHROMATIC (IV SOLUTION) ×2 IMPLANT
KIT ROOM TURNOVER OR (KITS) ×3 IMPLANT
MANIFOLD 4PT FOR NEPTUNE1 (MISCELLANEOUS) ×3 IMPLANT
NEEDLE HYPO 21X1.5 SAFETY (NEEDLE) ×6 IMPLANT
PACK ARTHROSCOPY KNEE (MISCELLANEOUS) ×3 IMPLANT
STRAP BODY AND KNEE 60X3 (MISCELLANEOUS) ×3 IMPLANT
SUT PROLENE 4 0 PS 2 18 (SUTURE) ×3 IMPLANT
SUT VIC AB 2-0 CT1 27 (SUTURE)
SUT VIC AB 2-0 CT1 TAPERPNT 27 (SUTURE) IMPLANT
SYR 50ML LL SCALE MARK (SYRINGE) ×3 IMPLANT
TUBING ARTHRO INFLOW-ONLY STRL (TUBING) ×3 IMPLANT
WAND HAND CNTRL MULTIVAC 90 (MISCELLANEOUS) IMPLANT

## 2016-11-27 NOTE — Anesthesia Procedure Notes (Signed)
Procedure Name: LMA Insertion Date/Time: 11/27/2016 1:33 PM Performed by: Andee PolesBUSH, Emmali Karow Pre-anesthesia Checklist: Patient identified, Emergency Drugs available, Suction available, Timeout performed and Patient being monitored Patient Re-evaluated:Patient Re-evaluated prior to inductionOxygen Delivery Method: Circle system utilized Preoxygenation: Pre-oxygenation with 100% oxygen Intubation Type: IV induction LMA: LMA inserted LMA Size: 4.0 Number of attempts: 1 Placement Confirmation: positive ETCO2 and breath sounds checked- equal and bilateral Tube secured with: Tape

## 2016-11-27 NOTE — Op Note (Signed)
11/27/2016  2:14 PM  Patient:   Benjamin Medina  Pre-Op Diagnosis:   Complex medial meniscus tear with underlying degenerative joint disease, right knee.  Postoperative diagnosis:   Same.  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  Surgeon:   Maryagnes AmosJ. Jeffrey Kyera Felan, M.D.  Anesthesia:   General LMA.  Findings:   As above. The lateral meniscus was in excellent condition, as were the anterior and posterior cruciate ligaments. There were grade 2 chondromalacial changes involving both the femoral trochlea and the central ridge of the patella as well.  Complications:   None.  EBL:   5 cc.  Total fluids:   300 cc of crystalloid.  Tourniquet time:   None  Drains:   None  Closure:   4-0 Prolene interrupted sutures.  Brief clinical note:   The patient is a 65 year old male with a one-year history of progressive worsening medial sided right knee pain. His symptoms have persisted despite medications, activity modification, etc. His history and examination were consistent with a medial meniscus tear which was confirmed by MRI scan. The patient presents at this time for arthroscopy, debridement, and partial medial meniscectomy.  Procedure:   The patient was brought into the operating room and lain in the supine position. After adequate general laryngeal mask anesthesia was obtained, a timeout was performed to verify the appropriate side. The patient's right knee was injected sterilely using a solution of 30 cc of 1% lidocaine and 30 cc of 0.5% Sensorcaine with epinephrine. The right lower extremity was prepped with ChloraPrep solution before being draped sterilely. Preoperative antibiotics were administered. The expected portal sites were injected with 0.5% Sensorcaine with epinephrine before the camera was placed in the anterolateral portal and instrumentation performed  through the anteromedial portal. The knee was sequentially examined beginning in the suprapatellar pouch, then progressing to the patellofemoral space, the medial gutter compartment, the notch, and finally the lateral compartment and gutter. The findings were as described above. Abundant reactive synovial tissues anteriorly were debrided using the full-radius resector in order to improve visualization. The areas of degenerative tearing of the posterior and medial portions of the medial meniscus were debrided back to stable margins using a combination of the straight and up-biting mini-munchers, the side-biting baskets, and the full-radius resector. Subcutaneous probing of the remaining rim demonstrated excellent stability. Areas of grade 2-3 chondral malacia involving the central portion of the medial tibial plateau as well as the weightbearing portion medial femoral condyle also were debrided back to stable margins using the full-radius resector. Laterally, the meniscus was intact to probing. The instruments were removed from the joint after suctioning the excess fluid. The portal sites were closed using 4-0 Prolene interrupted sutures before a sterile bulky dressing was applied to the knee. The patient was then awakened, extubated, and returned to the recovery room in satisfactory condition after tolerating the procedure well.

## 2016-11-27 NOTE — Discharge Instructions (Signed)
General Anesthesia, Adult, Care After These instructions provide you with information about caring for yourself after your procedure. Your health care provider may also give you more specific instructions. Your treatment has been planned according to current medical practices, but problems sometimes occur. Call your health care provider if you have any problems or questions after your procedure. What can I expect after the procedure? After the procedure, it is common to have:  Vomiting.  A sore throat.  Mental slowness. It is common to feel:  Nauseous.  Cold or shivery.  Sleepy.  Tired.  Sore or achy, even in parts of your body where you did not have surgery. Follow these instructions at home: For at least 24 hours after the procedure:  Do not:  Participate in activities where you could fall or become injured.  Drive.  Use heavy machinery.  Drink alcohol.  Take sleeping pills or medicines that cause drowsiness.  Make important decisions or sign legal documents.  Take care of children on your own.  Rest. Eating and drinking  If you vomit, drink water, juice, or soup when you can drink without vomiting.  Drink enough fluid to keep your urine clear or pale yellow.  Make sure you have little or no nausea before eating solid foods.  Follow the diet recommended by your health care provider. General instructions  Have a responsible adult stay with you until you are awake and alert.  Return to your normal activities as told by your health care provider. Ask your health care provider what activities are safe for you.  Take over-the-counter and prescription medicines only as told by your health care provider.  If you smoke, do not smoke without supervision.  Keep all follow-up visits as told by your health care provider. This is important. Contact a health care provider if:  You continue to have nausea or vomiting at home, and medicines are not helpful.  You  cannot drink fluids or start eating again.  You cannot urinate after 8-12 hours.  You develop a skin rash.  You have fever.  You have increasing redness at the site of your procedure. Get help right away if:  You have difficulty breathing.  You have chest pain.  You have unexpected bleeding.  You feel that you are having a life-threatening or urgent problem. This information is not intended to replace advice given to you by your health care provider. Make sure you discuss any questions you have with your health care provider. Document Released: 03/24/2001 Document Revised: 05/20/2016 Document Reviewed: 11/30/2015 Elsevier Interactive Patient Education  2017 [employer]  Keep dressing dry and intact.  May shower after dressing changed on post-op day #4 (Sunday).  Cover sutures with Band-Aids after drying off. Apply ice frequently to knee. Resume Mobic daily with food. Take pain medication as prescribed or ES Tylenol when needed.  May weight-bear as tolerated - use crutches or walker as needed. Follow-up in 10-14 days or as scheduled.

## 2016-11-27 NOTE — Anesthesia Postprocedure Evaluation (Signed)
Anesthesia Post Note  Patient: Benjamin Medina  Procedure(s) Performed: Procedure(s) (LRB): KNEE ARTHROSCOPY WITH MEDIAL MENISECTOMY (Right)  Patient location during evaluation: PACU Anesthesia Type: General Level of consciousness: awake and alert Pain management: pain level controlled Vital Signs Assessment: post-procedure vital signs reviewed and stable Respiratory status: spontaneous breathing, nonlabored ventilation, respiratory function stable and patient connected to nasal cannula oxygen Cardiovascular status: blood pressure returned to baseline and stable Postop Assessment: no signs of nausea or vomiting Anesthetic complications: no    Scarlette Sliceachel B Beach

## 2016-11-27 NOTE — Transfer of Care (Signed)
Immediate Anesthesia Transfer of Care Note  Patient: Benjamin Medina  Procedure(s) Performed: Procedure(s): KNEE ARTHROSCOPY WITH MEDIAL MENISECTOMY (Right)  Patient Location: PACU  Anesthesia Type: General LMA  Level of Consciousness: awake, alert  and patient cooperative  Airway and Oxygen Therapy: Patient Spontanous Breathing and Patient connected to supplemental oxygen  Post-op Assessment: Post-op Vital signs reviewed, Patient's Cardiovascular Status Stable, Respiratory Function Stable, Patent Airway and No signs of Nausea or vomiting  Post-op Vital Signs: Reviewed and stable  Complications: No apparent anesthesia complications

## 2016-11-27 NOTE — Anesthesia Preprocedure Evaluation (Signed)
Anesthesia Evaluation  Patient identified by MRN, date of birth, ID band Patient awake    Reviewed: Allergy & Precautions, H&P , NPO status , Patient's Chart, lab work & pertinent test results, reviewed documented beta blocker date and time   Airway Mallampati: II  TM Distance: >3 FB Neck ROM: full    Dental no notable dental hx.    Pulmonary neg pulmonary ROS, former smoker,    Pulmonary exam normal breath sounds clear to auscultation       Cardiovascular Exercise Tolerance: Good negative cardio ROS   Rhythm:regular Rate:Normal     Neuro/Psych Hard of hearing negative psych ROS   GI/Hepatic negative GI ROS, Neg liver ROS,   Endo/Other  negative endocrine ROS  Renal/GU negative Renal ROS  negative genitourinary   Musculoskeletal  (+) Arthritis ,   Abdominal (+) + obese,   Peds  Hematology negative hematology ROS (+)   Anesthesia Other Findings   Reproductive/Obstetrics negative OB ROS                             Anesthesia Physical Anesthesia Plan  ASA: II  Anesthesia Plan: General LMA   Post-op Pain Management:    Induction:   Airway Management Planned:   Additional Equipment:   Intra-op Plan:   Post-operative Plan:   Informed Consent: I have reviewed the patients History and Physical, chart, labs and discussed the procedure including the risks, benefits and alternatives for the proposed anesthesia with the patient or authorized representative who has indicated his/her understanding and acceptance.   Dental Advisory Given  Plan Discussed with: CRNA  Anesthesia Plan Comments:         Anesthesia Quick Evaluation

## 2016-11-27 NOTE — H&P (Signed)
Paper H&P to be scanned into permanent record. H&P reviewed. No changes. 

## 2016-11-28 ENCOUNTER — Encounter: Payer: Self-pay | Admitting: Surgery

## 2017-04-01 ENCOUNTER — Ambulatory Visit (INDEPENDENT_AMBULATORY_CARE_PROVIDER_SITE_OTHER): Payer: BLUE CROSS/BLUE SHIELD | Admitting: Internal Medicine

## 2017-04-01 ENCOUNTER — Other Ambulatory Visit: Payer: Self-pay | Admitting: Internal Medicine

## 2017-04-01 ENCOUNTER — Encounter: Payer: Self-pay | Admitting: Internal Medicine

## 2017-04-01 VITALS — BP 138/82 | HR 68 | Ht 75.0 in | Wt 303.0 lb

## 2017-04-01 DIAGNOSIS — M7021 Olecranon bursitis, right elbow: Secondary | ICD-10-CM

## 2017-04-01 DIAGNOSIS — L409 Psoriasis, unspecified: Secondary | ICD-10-CM | POA: Diagnosis not present

## 2017-04-01 MED ORDER — CLOBETASOL PROPIONATE 0.05 % EX SOLN: 1.0000 "application " | Freq: Two times a day (BID) | CUTANEOUS | 2 refills | Status: DC

## 2017-04-01 NOTE — Progress Notes (Signed)
Date:  04/01/2017   Name:  Benjamin Medina   DOB:  [birth year]   MRN:  [id number]   Chief Complaint: Joint Swelling (X 2-3 weeks. Came home from beach- [place], and elbow was swollen. That went down. Came home from beach a second time and elbow was swollen again. When elbow is swollen it looks like fluid is built up around it. It gets hot to touch when swollen. ) and Rash (Rt shin rash- not spreading. Clobetasol gel helped but it's starting to go away now. ) Rash  This is a recurrent problem. The problem has been gradually improving since onset. The affected locations include the abdomen and right lower leg. The rash is characterized by scaling, redness and dryness. Pertinent negatives include no fatigue or shortness of breath. Past treatments include topical steroids. The treatment provided significant relief. (Psoriasis)   Elbow pain/swelling - two episodes of swelling of the right elbow with warmth and tenderness.  First episode resolved after 3 weeks.    Review of Systems  Constitutional: Negative for chills and fatigue.  Respiratory: Negative for chest tightness and shortness of breath.   Cardiovascular: Negative for chest pain and palpitations.  Musculoskeletal: Positive for arthralgias and joint swelling.  Skin: Positive for rash.  Psychiatric/Behavioral: Negative for sleep disturbance. The patient is not nervous/anxious.     Patient Active Problem List   Diagnosis Date Noted  . Complex tear of medial meniscus of right knee as current injury 11/06/2016  . Primary osteoarthritis of right knee 11/06/2016  . Auditory impairment 11/04/2015  . 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 Blood pressure elevated 11/04/2015  . Generalized psoriasis 11/04/2015  . Periodic limb movement 11/04/2015  . Hyperlipidemia 11/04/2015    Prior to Admission medications   Medication Sig Start Date End Date Taking? Authorizing  Provider  clobetasol (TEMOVATE) 0.05 % GEL Apply 1 application topically 2 (two) times daily. 06/12/16  Yes Reubin Milan, MD  meloxicam (MOBIC) 7.5 MG tablet Take 1 tablet (7.5 mg total) by mouth daily. 11/27/16  Yes Christena Flake, MD    No Known Allergies  Past Surgical History:  Procedure Laterality Date  . BLEPHAROPLASTY  2016  . COLONOSCOPY WITH PROPOFOL N/A 05/01/2016   Procedure: COLONOSCOPY WITH PROPOFOL;  Surgeon: Earline Mayotte, MD;  Location[facility] ENDOSCOPY;  Service: Endoscopy;  Laterality: N/A;  . KNEE ARTHROSCOPY WITH MEDIAL MENISECTOMY Right 11/27/2016   Procedure: KNEE ARTHROSCOPY WITH MEDIAL MENISECTOMY;  Surgeon: Christena Flake, MD;  Location: [facility];  Service: Orthopedics;  Laterality: Right;  . SINUS SURGERY WITH INSTATRAK    . VASECTOMY      Social History  Substance Use Topics  . Smoking status: Former Smoker    Quit date: 12/30/1985  . Smokeless tobacco: Never Used  . Alcohol use 2.4 oz/week    4 Standard drinks or equivalent per week     Comment:       Medication list has been reviewed and updated.   Physical Exam  Constitutional: He is oriented to person, place, and time. He appears well-developed. No distress.  HENT:  Head: Normocephalic and atraumatic.  Cardiovascular: Normal rate, regular rhythm and normal heart sounds.   Pulmonary/Chest: Effort normal and breath sounds normal. No respiratory distress. He has no wheezes.  Musculoskeletal: Normal range of motion.       Arms: Neurological: He is alert and oriented to person, place, and time.  Skin: Skin is warm and dry. No rash noted.  Scattered erythematous plaques over right anterior shin, abdomen and flank.  Psychiatric: He has a normal mood and affect. His behavior is normal. Thought content normal.  Nursing note and vitals reviewed.   BP 138/82   Pulse 68   Ht  (1.905 m)   Wt (!) 303 lb (137.4 kg)   SpO2 97%   BMI 37.87 kg/m   Assessment and Plan: 1. Generalized  psoriasis Continue clobetasol  2. Olecranon bursitis of right elbow Recommend soft neoprene sleeve while working Advil or aleve as needed See Ortho if persistent   Meds ordered this encounter  Medications  . clobetasol (TEMOVATE) 0.05 % external solution    Sig: Apply 1 application topically 2 (two) times daily.    Dispense:  50 mL    Refill:  2    Bari Edward, MD [facility]  04/01/2017

## 2017-04-01 NOTE — Patient Instructions (Signed)
Elbow Bursitis Elbow bursitis is inflammation of the fluid-filled sac (bursa) between the tip of your elbow bone (olecranon) and your skin. Elbow bursitis may also be called olecranon bursitis. Normally, the olecranon bursa has only a small amount of fluid in it to cushion and protect your elbow bone. Elbow bursitis causes fluid to build up inside the bursa. Over time, this swelling and inflammation can cause pain when you bend or lean on your elbow. What are the causes? Elbow bursitis may be caused by:  Elbow injury (acute trauma).  Leaning on hard surfaces for long periods of time.  Infection from an injury that breaks the skin near your elbow.  A bone growth (spur) that forms at the tip of your elbow.  A medical condition that causes inflammation in your body, such as gout or rheumatoid arthritis. The cause may also be unknown. What are the signs or symptoms? The first sign of elbow bursitis is usually swelling over the tip of your elbow. This can grow to be the size of a golf ball. This may start suddenly or develop gradually. You may also have:  Pain when bending or leaning on your elbow.  Restricted movement of your elbow. If your bursitis is caused by an infection, symptoms may also include:  Redness, warmth, and tenderness of the elbow.  Drainage of pus from the swollen area over your elbow, if the skin breaks open. How is this diagnosed? Your health care provider may be able to diagnose elbow bursitis based on your signs and symptoms, especially if you have recently been injured. Your health care provider will also do a physical exam. This may include:  X-rays to look for a bone spur or a bone fracture.  Draining fluid from the bursa to test it for infection.  Blood tests to rule out gout or rheumatoid arthritis. How is this treated? Treatment for elbow bursitis depends on the cause. Treatment may include:  Medicines. These may include:  Over-the-counter medicines to  relieve pain and inflammation.  Antibiotic medicines to fight infection.  Injections of anti-inflammatory medicines (steroids).  Wrapping your elbow with a bandage.  Draining fluid from the bursa.  Wearing elbow pads. If your bursitis does not get better with treatment, surgery may be needed to remove the bursa. Follow these instructions at home:  Take medicines only as directed by your health care provider.  If you were prescribed an antibiotic medicine, finish all of it even if you start to feel better.  If your bursitis is caused by an injury, rest your elbow and wear your bandage as directed by your health care provider. You may alsoapply ice to the injured area as directed by your health care provider:  Put ice in a plastic bag.  Place a towel between your skin and the bag.  Leave the ice on for 20 minutes, 2-3 times per day.  Avoid any activities that cause elbow pain.  Use elbow pads or elbow wraps to cushion your elbow. Contact a health care provider if:  You have a fever.  Your symptoms do not get better with treatment.  Your pain or swelling gets worse.  Your elbow pain or swelling goes away and then returns.  You have drainage of pus from the swollen area over your elbow. This information is not intended to replace advice given to you by your health care provider. Make sure you discuss any questions you have with your health care provider. Document Released: 01/15/2007 Document Revised: 05/23/2016 Document   Reviewed: 08/24/2014 Elsevier Interactive Patient Education  2017 [employer]  

## 2017-06-16 ENCOUNTER — Other Ambulatory Visit: Payer: Self-pay | Admitting: Internal Medicine

## 2017-06-16 DIAGNOSIS — L409 Psoriasis, unspecified: Secondary | ICD-10-CM

## 2017-11-13 IMAGING — MR MR KNEE*R* W/O CM
5 series · 38 of 40 positions shown · non-contrast
Comparison: None.

CLINICAL DATA: Medial knee pain with swelling and stiffness for 1
year. No acute injury or prior relevant surgery.

EXAM:
MRI OF THE RIGHT KNEE WITHOUT CONTRAST
TECHNIQUE: Multiplanar, multisequence MR imaging of the knee was performed. No
intravenous contrast was administered.

[Series 3: PD fat-sat · axial · 3.0mm · 0.33mm/px · z∈[-66,+50]mm · 8 of 36 slices shown (1 of 3)]
[im 1/36]
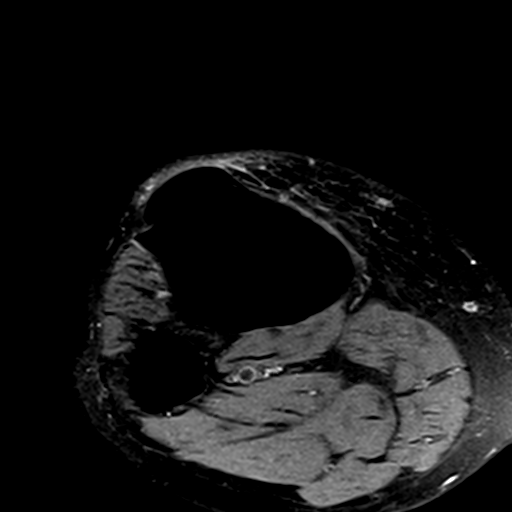
[im 6/36]
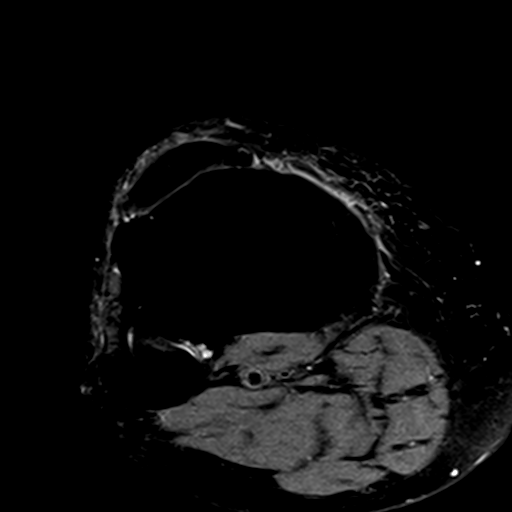
[im 11/36]
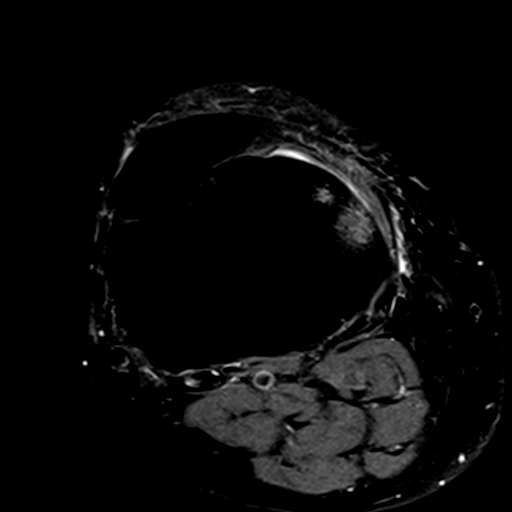
[im 16/36]
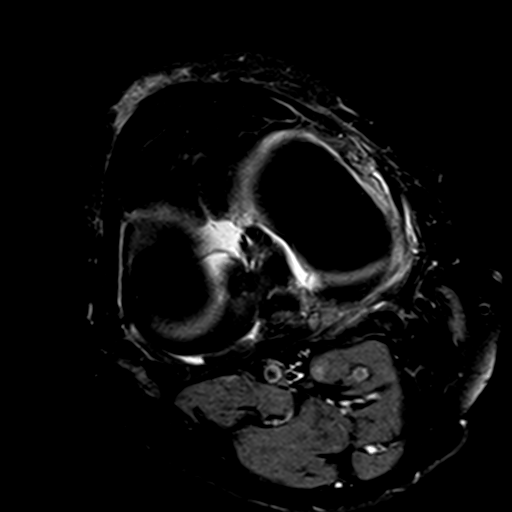
[im 21/36]
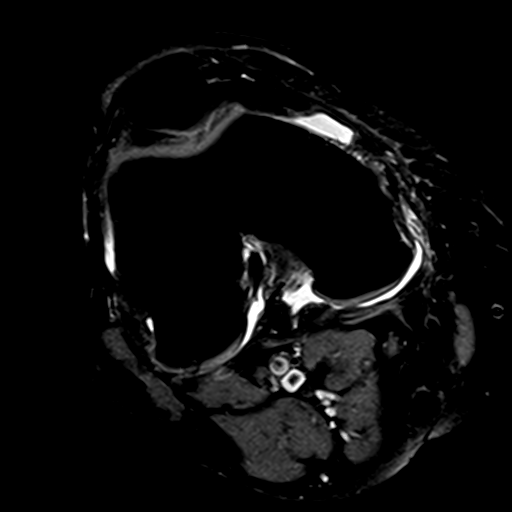
[im 26/36]
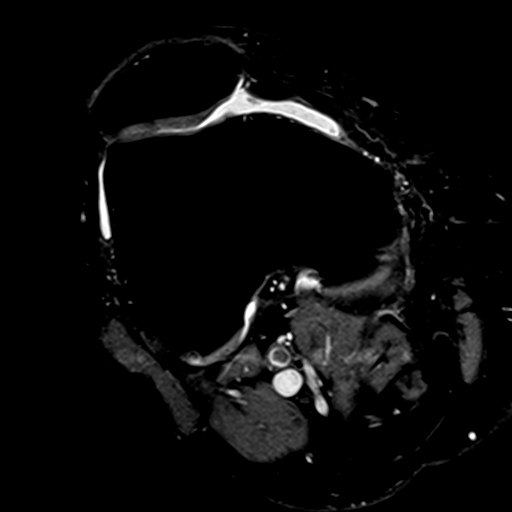
[im 31/36]
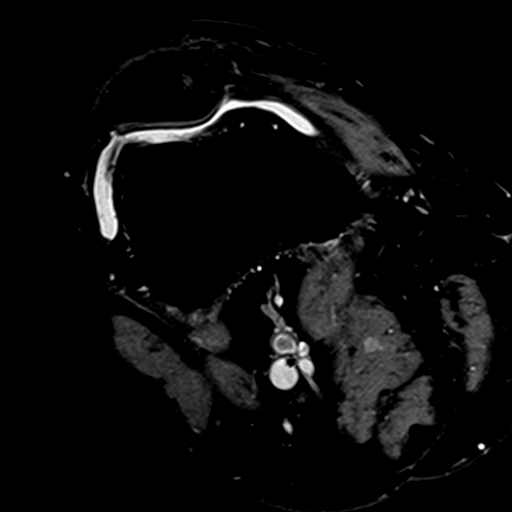
[im 36/36]
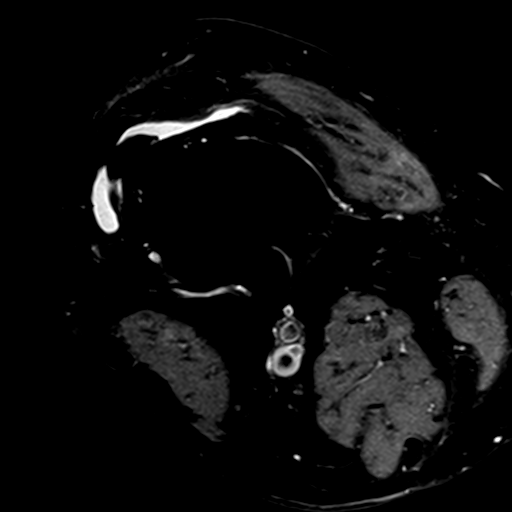

[Series 4: T1 · coronal · 3.0mm · 0.53mm/px · 6 of 40 slices shown]
[im 1/40]
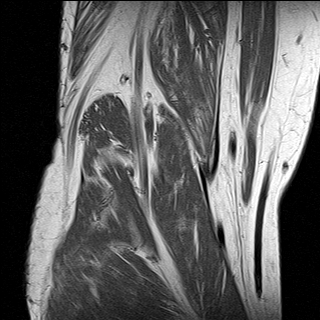
[im 6/40]
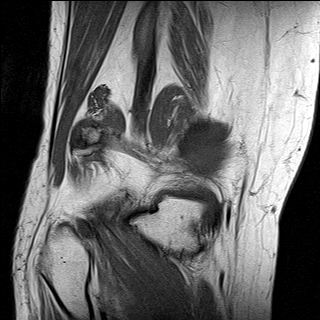
[im 12/40]
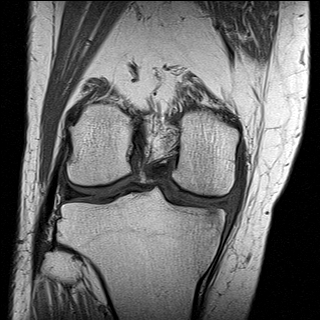
[im 17/40]
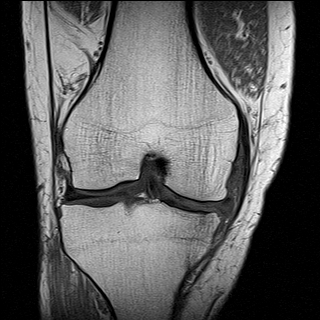
[im 23/40]
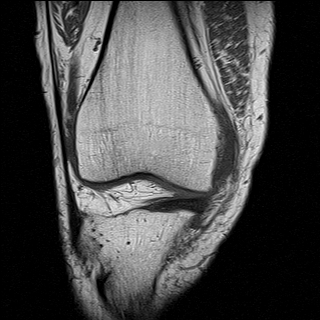
[im 28/40]
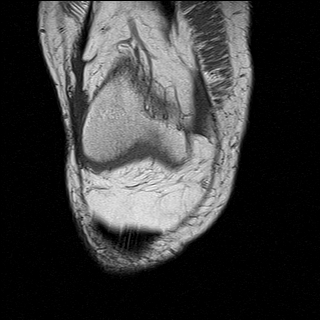

[Series 5: T2 fat-sat · coronal · 3.0mm · 0.53mm/px · 8 of 40 slices shown]
[im 1/40]
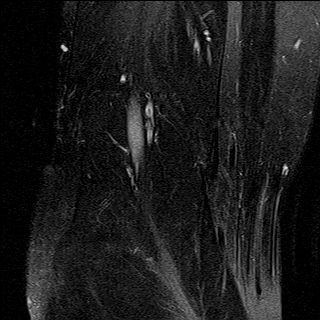
[im 6/40]
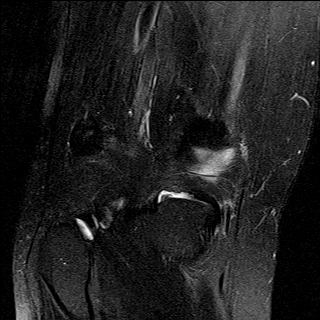
[im 12/40]
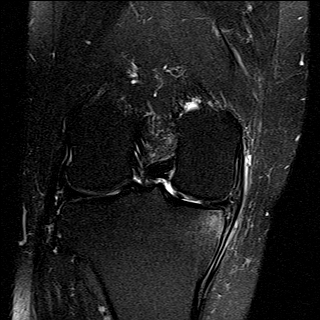
[im 17/40]
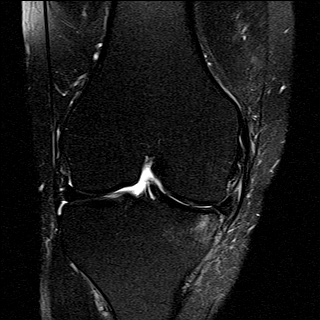
[im 23/40]
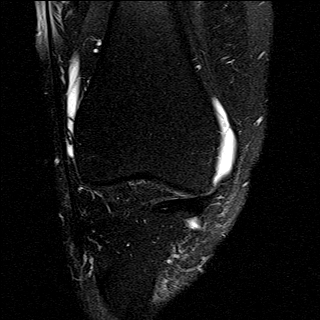
[im 28/40]
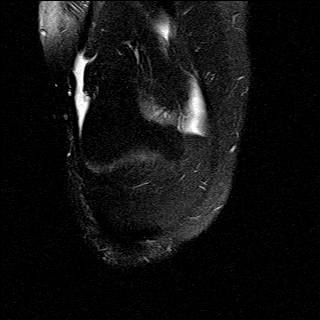
[im 34/40]
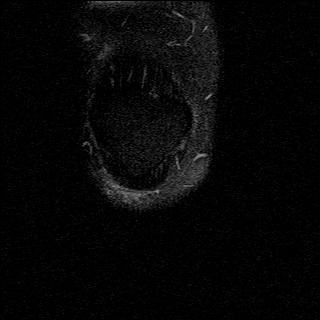
[im 40/40]
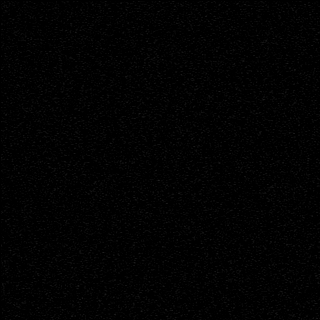

[Series 6: PD fat-sat · sagittal · 3.0mm · 0.62mm/px · 8 of 36 slices shown (2 of 3)]
[im 1/36]
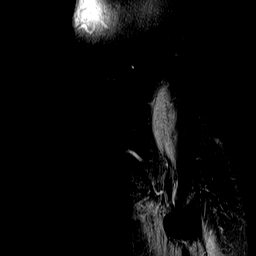
[im 6/36]
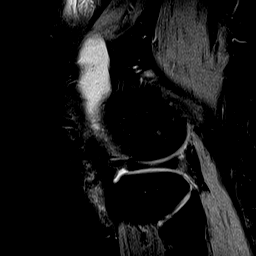
[im 11/36]
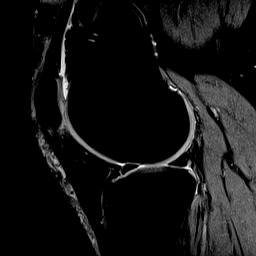
[im 16/36]
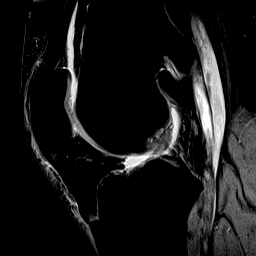
[im 21/36]
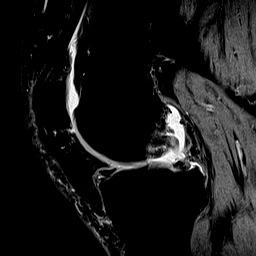
[im 26/36]
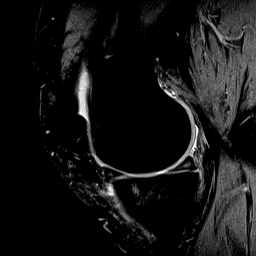
[im 31/36]
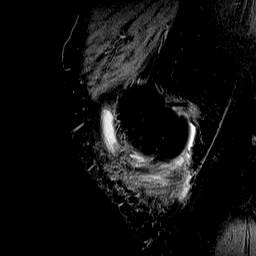
[im 36/36]
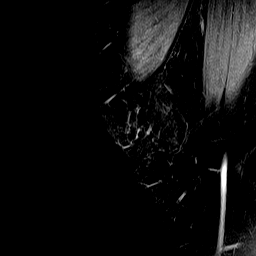

[Series 7: PD fat-sat · coronal · 3.0mm · 0.66mm/px · 8 of 40 slices shown (3 of 3)]
[im 1/40]
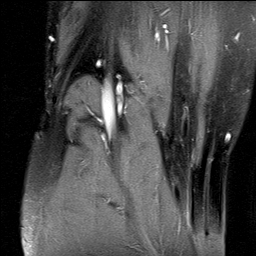
[im 6/40]
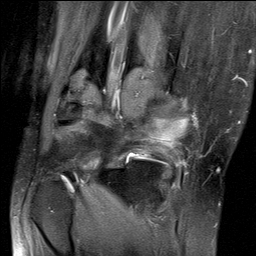
[im 12/40]
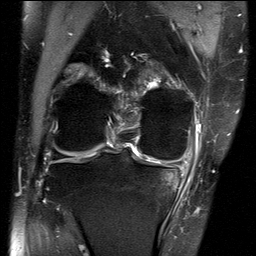
[im 17/40]
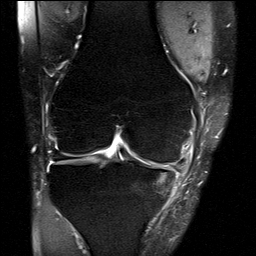
[im 23/40]
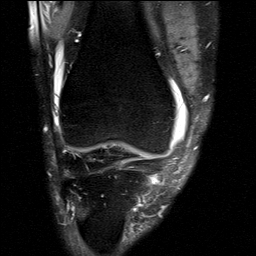
[im 28/40]
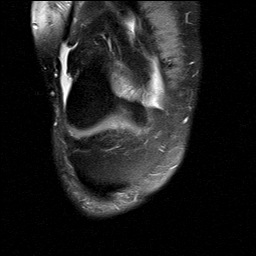
[im 34/40]
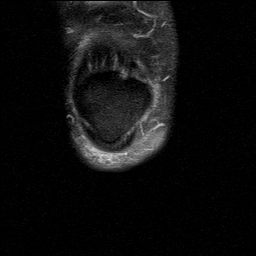
[im 40/40]
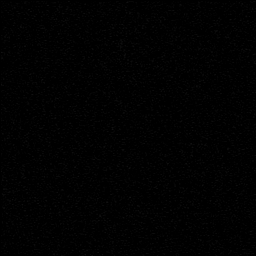

[38 of 40 positions shown; findings below may reference images not displayed]

FINDINGS: MENISCI

Medial meniscus: There is an irregular degenerative tear involving
the posterior horn and body of the medial meniscus, best seen on the
coronal and sagittal images. The meniscal root is intact. No
centrally displaced meniscal fragment identified.

Lateral meniscus:  Intact with normal morphology.

LIGAMENTS

Cruciates:  Intact.

Collaterals:  Intact with mild MCL degeneration.

CARTILAGE

Patellofemoral: Mild patellar chondral thinning with minimal
fissuring over the lateral facet. No full-thickness chondral defect.

Medial: Mild chondral thinning and surface irregularity. There is
asymmetric subchondral edema peripherally in the medial tibial
plateau with a small focus of subchondral linear low signal, likely
a small subchondral insufficiency fracture.

Lateral:  Preserved.

MISCELLANEOUS

Joint:  Small joint effusion.

Popliteal Fossa:  Unremarkable. No significant Baker's cyst.

Extensor Mechanism:  Intact.

Bones: As above, there is a probable subchondral insufficiency
fracture involving the peripheral aspect of the medial tibial
plateau. No other significant osseous findings.

Other: There is mild medial soft tissue edema. No focal fluid
collections are seen.
IMPRESSION: 1. Degenerative tear of the posterior horn and body of the medial
meniscus as described.
2. Underlying mild medial compartment degenerative changes with
probable superimposed insufficiency fracture involving the medial
tibial plateau.
3. Mild patellar chondromalacia.
4. The lateral meniscus, cruciate and collateral ligaments are
intact.

## 2018-03-16 ENCOUNTER — Other Ambulatory Visit: Payer: Self-pay | Admitting: Internal Medicine

## 2018-03-16 DIAGNOSIS — L409 Psoriasis, unspecified: Secondary | ICD-10-CM

## 2018-06-02 ENCOUNTER — Encounter: Payer: Self-pay | Admitting: Internal Medicine

## 2018-06-02 ENCOUNTER — Ambulatory Visit: Payer: BLUE CROSS/BLUE SHIELD | Admitting: Internal Medicine

## 2018-06-02 VITALS — BP 190/100 | HR 70 | Temp 97.8°F | Resp 16 | Ht 75.0 in | Wt 317.0 lb

## 2018-06-02 DIAGNOSIS — I10 Essential (primary) hypertension: Secondary | ICD-10-CM | POA: Insufficient documentation

## 2018-06-02 DIAGNOSIS — R030 Elevated blood-pressure reading, without diagnosis of hypertension: Secondary | ICD-10-CM | POA: Diagnosis not present

## 2018-06-02 MED ORDER — AMLODIPINE BESYLATE 5 MG PO TABS
5.0000 mg | ORAL_TABLET | Freq: Every day | ORAL | 1 refills | Status: DC
Start: 2018-06-02 — End: 2018-06-24

## 2018-06-02 NOTE — Progress Notes (Signed)
Date:  06/02/2018   Name:  Benjamin MillinKenneth A Garrels   DOB:  12/11/1951   MRN:  [id number]   Chief Complaint: Blood Pressure Check Hypertension  This is a new problem. The current episode started 1 to 4 weeks ago. The problem is unchanged. The problem is uncontrolled. Associated symptoms include malaise/fatigue and shortness of breath. Pertinent negatives include no chest pain, headaches, palpitations or peripheral edema. Past treatments include nothing.  He started noticing that he felt fatigued over the past few weeks and then took his BP the last few days with very elevated readings. He has not changed his diet, takes no cold or sinus meds, no over the counter herbal supplements, does not drink alcohol daily, does not smoke.  He has been walking for exercise and noticed mild SOB but no chest pain or heaviness.    Review of Systems  Constitutional: Positive for malaise/fatigue. Negative for chills, fatigue, fever and unexpected weight change.  Respiratory: Positive for shortness of breath. Negative for cough, chest tightness and wheezing.   Cardiovascular: Negative for chest pain, palpitations and leg swelling.  Musculoskeletal: Positive for neck stiffness (and mild tingling in left fingers).  Skin: Negative for rash.  Neurological: Negative for dizziness, tremors, syncope, weakness and headaches.  Hematological: Negative for adenopathy.  Psychiatric/Behavioral: Positive for sleep disturbance (due to nocturia). Negative for dysphoric mood. The patient is not nervous/anxious.     Patient Active Problem List   Diagnosis Date Noted  . Complex tear of medial meniscus of right knee as current injury 11/06/2016  . Primary osteoarthritis of right knee 11/06/2016  . Auditory impairment 11/04/2015  . 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 Blood pressure elevated 11/04/2015  . Generalized psoriasis 11/04/2015  . Periodic limb  movement 11/04/2015  . Hyperlipidemia 11/04/2015    Prior to Admission medications   Medication Sig Start Date End Date Taking? Authorizing Provider  clobetasol (TEMOVATE) 0.05 % external solution APPLY 1 APPLICATION TOPICALLY 2 (TWO) TIMES DAILY. 03/16/18   Reubin MilanBerglund, Ameshia Pewitt H, MD  clobetasol (TEMOVATE) 0.05 % GEL APPLY TO AFFECTED AREA TWICE A DAY 03/16/18   Reubin MilanBerglund, Kadin Canipe H, MD  meloxicam (MOBIC) 7.5 MG tablet Take 1 tablet (7.5 mg total) by mouth daily. 11/27/16   Poggi, Excell SeltzerJohn J, MD    No Known Allergies  Past Surgical History:  Procedure Laterality Date  . BLEPHAROPLASTY  2016  . COLONOSCOPY WITH PROPOFOL N/A 05/01/2016   Procedure: COLONOSCOPY WITH PROPOFOL;  Surgeon: Earline MayotteJeffrey W Byrnett, MD;  [facility];  Service: Endoscopy;  Laterality: N/A;  . KNEE ARTHROSCOPY WITH MEDIAL MENISECTOMY Right 11/27/2016   Procedure: KNEE ARTHROSCOPY WITH MEDIAL MENISECTOMY;  Surgeon: Christena FlakeJohn J Poggi, MD;  [facility];  Service: Orthopedics;  Laterality: Right;  . SINUS SURGERY WITH INSTATRAK    . VASECTOMY      Social History   Tobacco Use  . Smoking status: Former Smoker    Last attempt to quit: 12/30/1985    Years since quitting: 32.4  . Smokeless tobacco: Never Used  Substance Use Topics  . Alcohol use: Yes    Alcohol/week: 2.4 oz    Types: 4 Standard drinks or equivalent per week    Comment:    . Drug use: No     Medication list has been reviewed and updated.  Current Meds  Medication Sig  . clobetasol (TEMOVATE) 0.05 % external solution APPLY 1 APPLICATION TOPICALLY 2 (TWO) TIMES  DAILY.  . clobetasol (TEMOVATE) 0.05 % GEL APPLY TO AFFECTED AREA TWICE A DAY    PHQ 2/9 Scores 06/02/2018 06/12/2016  PHQ - 2 Score 1 0    Physical Exam  Constitutional: He is oriented to person, place, and time. He appears well-developed. No distress.  HENT:  Head: Normocephalic and atraumatic.  Eyes: Pupils are equal, round, and reactive to light.  Neck: Normal range of  motion. Neck supple.  Cardiovascular: Normal rate, regular rhythm and normal heart sounds.  Pulmonary/Chest: Effort normal and breath sounds normal. No respiratory distress.  Musculoskeletal: Normal range of motion. He exhibits no tenderness.  Lymphadenopathy:    He has no cervical adenopathy.  Neurological: He is alert and oriented to person, place, and time.  Skin: Skin is warm and dry. No rash noted.  Psychiatric: He has a normal mood and affect. His behavior is normal. Thought content normal.  Nursing note and vitals reviewed.   BP (!) 190/100   Pulse 70   Temp 97.8 F (36.6 C) (Oral)   Resp 16   Ht 6\' 3"  (1.905 m)   Wt (!) 317 lb (143.8 kg)   SpO2 99%   BMI 39.62 kg/m   Assessment and Plan: 1. Elevated blood pressure reading New finding - EKG 12-Lead - NSR @ 64, WNL - CBC with Differential/Platelet - Comprehensive metabolic panel - TSH  2. Essential hypertension Begin medication Recheck in 3 weeks - amLODipine (NORVASC) 5 MG tablet; Take 1 tablet (5 mg total) by mouth daily.  Dispense: 30 tablet; Refill: 1   Meds ordered this encounter  Medications  . amLODipine (NORVASC) 5 MG tablet    Sig: Take 1 tablet (5 mg total) by mouth daily.    Dispense:  30 tablet    Refill:  1    Partially dictated using Animal nutritionist. Any errors are unintentional.  Bari Edward, MD [facility]  06/02/2018

## 2018-06-03 LAB — CBC WITH DIFFERENTIAL/PLATELET
BASOS: 1 %
Basophils Absolute: 0 10*3/uL (ref 0.0–0.2)
EOS (ABSOLUTE): 0.2 10*3/uL (ref 0.0–0.4)
Eos: 3 %
HEMOGLOBIN: 14.9 g/dL (ref 13.0–17.7)
Hematocrit: 44.3 % (ref 37.5–51.0)
IMMATURE GRANS (ABS): 0 10*3/uL (ref 0.0–0.1)
Immature Granulocytes: 0 %
LYMPHS: 27 %
Lymphocytes Absolute: 1.7 10*3/uL (ref 0.7–3.1)
MCH: 31.8 pg (ref 26.6–33.0)
MCHC: 33.6 g/dL (ref 31.5–35.7)
MCV: 95 fL (ref 79–97)
MONOCYTES: 8 %
Monocytes Absolute: 0.5 10*3/uL (ref 0.1–0.9)
NEUTROS ABS: 3.9 10*3/uL (ref 1.4–7.0)
Neutrophils: 61 %
Platelets: 268 10*3/uL (ref 150–450)
RBC: 4.69 x10E6/uL (ref 4.14–5.80)
RDW: 14.5 % (ref 12.3–15.4)
WBC: 6.3 10*3/uL (ref 3.4–10.8)

## 2018-06-03 LAB — COMPREHENSIVE METABOLIC PANEL
A/G RATIO: 1.8 (ref 1.2–2.2)
ALBUMIN: 4.9 g/dL — AB (ref 3.6–4.8)
ALT: 49 IU/L — ABNORMAL HIGH (ref 0–44)
AST: 31 IU/L (ref 0–40)
Alkaline Phosphatase: 72 IU/L (ref 39–117)
BILIRUBIN TOTAL: 0.4 mg/dL (ref 0.0–1.2)
BUN / CREAT RATIO: 12 (ref 10–24)
BUN: 11 mg/dL (ref 8–27)
CALCIUM: 9.9 mg/dL (ref 8.6–10.2)
CHLORIDE: 101 mmol/L (ref 96–106)
CO2: 23 mmol/L (ref 20–29)
Creatinine, Ser: 0.95 mg/dL (ref 0.76–1.27)
GFR, EST AFRICAN AMERICAN: 97 mL/min/{1.73_m2} (ref >59)
GFR, EST NON AFRICAN AMERICAN: 84 mL/min/{1.73_m2} (ref >59)
GLUCOSE: 116 mg/dL — AB (ref 65–99)
Globulin, Total: 2.7 g/dL (ref 1.5–4.5)
Potassium: 4.5 mmol/L (ref 3.5–5.2)
Sodium: 139 mmol/L (ref 134–144)
TOTAL PROTEIN: 7.6 g/dL (ref 6.0–8.5)

## 2018-06-03 LAB — TSH: TSH: 1.39 u[IU]/mL (ref 0.450–4.500)

## 2018-06-12 ENCOUNTER — Encounter: Payer: Self-pay | Admitting: Internal Medicine

## 2018-06-12 DIAGNOSIS — E118 Type 2 diabetes mellitus with unspecified complications: Secondary | ICD-10-CM | POA: Insufficient documentation

## 2018-06-12 LAB — HGB A1C W/O EAG: HEMOGLOBIN A1C: 6.6 % — AB (ref 4.8–5.6)

## 2018-06-12 LAB — SPECIMEN STATUS REPORT

## 2018-06-17 ENCOUNTER — Ambulatory Visit: Payer: BLUE CROSS/BLUE SHIELD | Admitting: Internal Medicine

## 2018-06-24 ENCOUNTER — Ambulatory Visit: Payer: BLUE CROSS/BLUE SHIELD | Admitting: Internal Medicine

## 2018-06-24 ENCOUNTER — Other Ambulatory Visit: Payer: Self-pay | Admitting: Internal Medicine

## 2018-06-24 ENCOUNTER — Encounter: Payer: Self-pay | Admitting: Internal Medicine

## 2018-06-24 VITALS — BP 138/88 | HR 81 | Temp 97.8°F | Resp 16 | Ht 76.0 in | Wt 308.0 lb

## 2018-06-24 DIAGNOSIS — I10 Essential (primary) hypertension: Secondary | ICD-10-CM | POA: Diagnosis not present

## 2018-06-24 DIAGNOSIS — L409 Psoriasis, unspecified: Secondary | ICD-10-CM

## 2018-06-24 DIAGNOSIS — E119 Type 2 diabetes mellitus without complications: Secondary | ICD-10-CM | POA: Diagnosis not present

## 2018-06-24 MED ORDER — AMLODIPINE BESYLATE 5 MG PO TABS: 5.0000 mg | ORAL_TABLET | Freq: Every day | ORAL | 5 refills | Status: DC

## 2018-06-24 MED ORDER — LISINOPRIL 10 MG PO TABS: 10.0000 mg | ORAL_TABLET | Freq: Every day | ORAL | 5 refills | Status: DC

## 2018-06-24 NOTE — Progress Notes (Signed)
Date:  06/24/2018   Name:  Benjamin Medina   DOB:  1951-08-28   MRN:  [id number]   Chief Complaint: Hypertension Hypertension  This is a chronic problem. The problem has been gradually improving since onset. The problem is controlled. Pertinent negatives include no chest pain, headaches, palpitations or shortness of breath. Past treatments include calcium channel blockers. The current treatment provides moderate improvement.  Diabetes  He presents for his follow-up diabetic visit. Diabetes type: prediabetes. Pertinent negatives for hypoglycemia include no headaches or tremors. Pertinent negatives for diabetes include no chest pain, no fatigue, no polydipsia and no polyuria. There are no diabetic complications. Current diabetic treatment includes diet.  noted to have elevated A1C 6.6 last visit.  He has changed his diet and is exercising every day.  He has lost 10 lbs. We discussed alcohol and I recommend that he try to reduce his intake to half the usual if possible.  Review of Systems  Constitutional: Negative for appetite change, fatigue and unexpected weight change.  Eyes: Negative for visual disturbance.  Respiratory: Negative for cough, shortness of breath and wheezing.   Cardiovascular: Negative for chest pain, palpitations and leg swelling.  Gastrointestinal: Negative for abdominal pain and blood in stool.  Endocrine: Negative for polydipsia and polyuria.  Genitourinary: Negative for dysuria and hematuria.  Skin: Negative for color change and rash.  Allergic/Immunologic: Negative for environmental allergies.  Neurological: Negative for tremors, numbness and headaches.  Psychiatric/Behavioral: Negative for dysphoric mood.    Patient Active Problem List   Diagnosis Date Noted  . Diabetes mellitus without complication (HCC) 06/12/2018  . Essential hypertension 06/02/2018  . Complex tear of medial meniscus of right knee as current injury 11/06/2016  . Primary osteoarthritis of  right knee 11/06/2016  . Auditory impairment 11/04/2015  . 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 Blood pressure elevated 11/04/2015  . Generalized psoriasis 11/04/2015  . Periodic limb movement 11/04/2015  . Hyperlipidemia 11/04/2015    Prior to Admission medications   Medication Sig Start Date End Date Taking? Authorizing Provider  amLODipine (NORVASC) 5 MG tablet Take 1 tablet (5 mg total) by mouth daily. 06/02/18  Yes Reubin Milan, MD  clobetasol (TEMOVATE) 0.05 % external solution APPLY 1 APPLICATION TOPICALLY 2 (TWO) TIMES DAILY. 03/16/18  Yes Reubin Milan, MD  clobetasol (TEMOVATE) 0.05 % GEL APPLY TO AFFECTED AREA TWICE A DAY 03/16/18  Yes Reubin Milan, MD  meloxicam (MOBIC) 7.5 MG tablet Take 1 tablet (7.5 mg total) by mouth daily. Patient taking differently: Take 7.5 mg by mouth as needed.  11/27/16  Yes Poggi, Excell Seltzer, MD    No Known Allergies  Past Surgical History:  Procedure Laterality Date  . BLEPHAROPLASTY  2016  . COLONOSCOPY WITH PROPOFOL N/A 05/01/2016   Procedure: COLONOSCOPY WITH PROPOFOL;  Surgeon: Earline Mayotte, MD;  Location: [facility] ENDOSCOPY;  Service: Endoscopy;  Laterality: N/A;  . KNEE ARTHROSCOPY WITH MEDIAL MENISECTOMY Right 11/27/2016   Procedure: KNEE ARTHROSCOPY WITH MEDIAL MENISECTOMY;  Surgeon: Christena Flake, MD;  Location[facility];  Service: Orthopedics;  Laterality: Right;  . SINUS SURGERY WITH INSTATRAK    . VASECTOMY      Social History   Tobacco Use  . Smoking status: Former Smoker    Last attempt to quit: 12/30/1985    Years since quitting: 32.5  . Smokeless tobacco: Never Used  Substance Use Topics  . Alcohol use:  Yes    Alcohol/week: 2.4 oz    Types: 4 Standard drinks or equivalent per week    Comment:    . Drug use: No     Medication list has been reviewed and updated.  Current Meds  Medication Sig  . amLODipine (NORVASC) 5 MG  tablet Take 1 tablet (5 mg total) by mouth daily.  . clobetasol (TEMOVATE) 0.05 % external solution APPLY 1 APPLICATION TOPICALLY 2 (TWO) TIMES DAILY.  . clobetasol (TEMOVATE) 0.05 % GEL APPLY TO AFFECTED AREA TWICE A DAY  . meloxicam (MOBIC) 7.5 MG tablet Take 1 tablet (7.5 mg total) by mouth daily. (Patient taking differently: Take 7.5 mg by mouth as needed. )    PHQ 2/9 Scores 06/02/2018 06/12/2016  PHQ - 2 Score 1 0    Physical Exam  Constitutional: He is oriented to person, place, and time. He appears well-developed. No distress.  HENT:  Head: Normocephalic and atraumatic.  Neck: Normal range of motion. Neck supple. Carotid bruit is not present.  Cardiovascular: Normal rate, regular rhythm and normal heart sounds.  Pulmonary/Chest: Effort normal and breath sounds normal. No respiratory distress.  Musculoskeletal: Normal range of motion.  Neurological: He is alert and oriented to person, place, and time.  Skin: Skin is warm and dry. No rash noted.  Psychiatric: He has a normal mood and affect. His behavior is normal. Thought content normal.  Nursing note and vitals reviewed.   BP 138/88   Pulse 81   Temp 97.8 F (36.6 C) (Oral)   Resp 16   Ht 6\' 4"  (1.93 m)   Wt (!) 308 lb (139.7 kg)   SpO2 97%   BMI 37.49 kg/m   Assessment and Plan: 1. Essential hypertension Needs additional medication - lisinopril (PRINIVIL,ZESTRIL) 10 MG tablet; Take 1 tablet (10 mg total) by mouth daily.  Dispense: 30 tablet; Refill: 5 - amLODipine (NORVASC) 5 MG tablet; Take 1 tablet (5 mg total) by mouth daily.  Dispense: 30 tablet; Refill: 5  2. Diabetes mellitus without complication (HCC) Continue diet, weight loss and exercise No medications for now   Meds ordered this encounter  Medications  . lisinopril (PRINIVIL,ZESTRIL) 10 MG tablet    Sig: Take 1 tablet (10 mg total) by mouth daily.    Dispense:  30 tablet    Refill:  5  . amLODipine (NORVASC) 5 MG tablet    Sig: Take 1 tablet (5 mg  total) by mouth daily.    Dispense:  30 tablet    Refill:  5    Partially dictated using Animal nutritionistDragon software. Any errors are unintentional.  Bari EdwardLaura Bryam Taborda, MD [facility]  06/24/2018

## 2018-09-29 ENCOUNTER — Other Ambulatory Visit: Payer: Self-pay | Admitting: Internal Medicine

## 2018-09-29 DIAGNOSIS — L409 Psoriasis, unspecified: Secondary | ICD-10-CM

## 2018-10-15 ENCOUNTER — Encounter: Payer: Self-pay | Admitting: Internal Medicine

## 2018-10-15 ENCOUNTER — Ambulatory Visit (INDEPENDENT_AMBULATORY_CARE_PROVIDER_SITE_OTHER): Payer: BLUE CROSS/BLUE SHIELD | Admitting: Internal Medicine

## 2018-10-15 VITALS — BP 118/80 | HR 61 | Ht 76.0 in | Wt 296.0 lb

## 2018-10-15 DIAGNOSIS — E119 Type 2 diabetes mellitus without complications: Secondary | ICD-10-CM

## 2018-10-15 DIAGNOSIS — Z23 Encounter for immunization: Secondary | ICD-10-CM | POA: Diagnosis not present

## 2018-10-15 DIAGNOSIS — I10 Essential (primary) hypertension: Secondary | ICD-10-CM

## 2018-10-15 NOTE — Patient Instructions (Addendum)
Schedule Complete eye exam to include retinal exam for pre-diabetesPneumococcal Conjugate Vaccine (PCV13) What You Need to Know 1. Why get vaccinated? Vaccination can protect both children and adults from pneumococcal disease. Pneumococcal disease is caused by bacteria that can spread from person to person through close contact. It can cause ear infections, and it can also lead to more serious infections of the:  Lungs (pneumonia),  Blood (bacteremia), and  Covering of the brain and spinal cord (meningitis).  Pneumococcal pneumonia is most common among adults. Pneumococcal meningitis can cause deafness and brain damage, and it kills about 1 child in 10 who get it. Anyone can get pneumococcal disease, but children under 17 years of age and adults 29 years and older, people with certain medical conditions, and cigarette smokers are at the highest risk. Before there was a vaccine, the [place] saw:  more than 700 cases of meningitis,  about 13,000 blood infections,  about 5 million ear infections, and  about 200 deaths  in children under 5 each year from pneumococcal disease. Since vaccine became available, severe pneumococcal disease in these children has fallen by 88%. About 18,000 older adults die of pneumococcal disease each year in the [place]. Treatment of pneumococcal infections with penicillin and other drugs is not as effective as it used to be, because some strains of the disease have become resistant to these drugs. This makes prevention of the disease, through vaccination, even more important. 2. PCV13 vaccine Pneumococcal conjugate vaccine (called PCV13) protects against 13 types of pneumococcal bacteria. PCV13 is routinely given to children at 2, 4, 6, and 103-56 months of age. It is also recommended for children and adults 28 to 80 years of age with certain health conditions, and for all adults 31 years of age and older. Your doctor can give you details. 3. Some  people should not get this vaccine Anyone who has ever had a life-threatening allergic reaction to a dose of this vaccine, to an earlier pneumococcal vaccine called PCV7, or to any vaccine containing diphtheria toxoid (for example, DTaP), should not get PCV13. Anyone with a severe allergy to any component of PCV13 should not get the vaccine. Tell your doctor if the person being vaccinated has any severe allergies. If the person scheduled for vaccination is not feeling well, your healthcare provider might decide to reschedule the shot on another day. 4. Risks of a vaccine reaction With any medicine, including vaccines, there is a chance of reactions. These are usually mild and go away on their own, but serious reactions are also possible. Problems reported following PCV13 varied by age and dose in the series. The most common problems reported among children were:  About half became drowsy after the shot, had a temporary loss of appetite, or had redness or tenderness where the shot was given.  About 1 out of 3 had swelling where the shot was given.  About 1 out of 3 had a mild fever, and about 1 in 20 had a fever over 102.32F.  Up to about 8 out of 10 became fussy or irritable.  Adults have reported pain, redness, and swelling where the shot was given; also mild fever, fatigue, headache, chills, or muscle pain. Young children who get PCV13 along with inactivated flu vaccine at the same time may be at increased risk for seizures caused by fever. Ask your doctor for more information. Problems that could happen after any vaccine:  People sometimes faint after a medical procedure, including vaccination. Sitting or  lying down for about 15 minutes can help prevent fainting, and injuries caused by a fall. Tell your doctor if you feel dizzy, or have vision changes or ringing in the ears.  Some older children and adults get severe pain in the shoulder and have difficulty moving the arm where a shot was  given. This happens very rarely.  Any medication can cause a severe allergic reaction. Such reactions from a vaccine are very rare, estimated at about 1 in a million doses, and would happen within a few minutes to a few hours after the vaccination. As with any medicine, there is a very small chance of a vaccine causing a serious injury or death. The safety of vaccines is always being monitored. For more information, visit: http://floyd.org/ 5. What if there is a serious reaction? What should I look for? Look for anything that concerns you, such as signs of a severe allergic reaction, very high fever, or unusual behavior. Signs of a severe allergic reaction can include hives, swelling of the face and throat, difficulty breathing, a fast heartbeat, dizziness, and weakness-usually within a few minutes to a few hours after the vaccination. What should I do?  If you think it is a severe allergic reaction or other emergency that can't wait, call 9-1-1 or get the person to the nearest hospital. Otherwise, call your doctor.  Reactions should be reported to the Vaccine Adverse Event Reporting System (VAERS). Your doctor should file this report, or you can do it yourself through the VAERS web site at www.vaers.LAgents.no, or by calling 1-534-538-2422. ? VAERS does not give medical advice. 6. The National Vaccine Injury Compensation Program The [employer] Vaccine Injury Compensation Program (VICP) is a federal program that was created to compensate people who may have been injured by certain vaccines. Persons who believe they may have been injured by a vaccine can learn about the program and about filing a claim by calling 1-984-595-8125 or visiting the VICP website at SpiritualWord.at. There is a time limit to file a claim for compensation. 7. How can I learn more?  Ask your healthcare provider. He or she can give you the vaccine package insert or suggest other sources of  information.  Call your local or state health department.  Contact the Centers for Disease Control and Prevention (CDC): ? Call (925) 256-2438 (1-800-CDC-INFO) or ? Visit CDC's website at PicCapture.uy Vaccine Information Statement, PCV13 Vaccine (11/03/2014) This information is not intended to replace advice given to you by your health care provider. Make sure you discuss any questions you have with your health care provider. Document Released: 10/13/2006 Document Revised: 09/05/2016 Document Reviewed: 09/05/2016 Elsevier Interactive Patient Education  2017 [employer].

## 2018-10-15 NOTE — Progress Notes (Signed)
Date:  10/15/2018   Name:  Benjamin Medina   DOB:  12-Sep-1951   MRN:  [id number]   Chief Complaint: Diabetes (High dose flu shot. )  Diabetes  He presents for his follow-up diabetic visit. Diabetes type: pre diabetes. His disease course has been improving. Pertinent negatives for hypoglycemia include no headaches or tremors. Pertinent negatives for diabetes include no chest pain, no fatigue, no polydipsia and no polyuria. Current diabetic treatment includes diet. His weight is decreasing steadily. He participates in exercise intermittently. An ACE inhibitor/angiotensin II receptor blocker is being taken. Eye exam is not current.  Hypertension  This is a new problem. The problem has been gradually improving since onset. The problem is controlled. Pertinent negatives include no chest pain, headaches, palpitations or shortness of breath. Past treatments include ACE inhibitors. The current treatment provides significant improvement. There are no compliance problems.   He has changed his diet, limiting portions and exercising with walking.  Lab Results  Component Value Date   HGBA1C 6.6 (H) 06/02/2018     Review of Systems  Constitutional: Negative for appetite change, fatigue and unexpected weight change.  Eyes: Negative for visual disturbance.  Respiratory: Negative for cough, shortness of breath and wheezing.   Cardiovascular: Negative for chest pain, palpitations and leg swelling.  Gastrointestinal: Negative for abdominal pain and blood in stool.  Endocrine: Negative for polydipsia and polyuria.  Genitourinary: Negative for dysuria and hematuria.  Skin: Negative for color change and rash.  Allergic/Immunologic: Negative for environmental allergies.  Neurological: Negative for tremors, numbness and headaches.  Psychiatric/Behavioral: Negative for dysphoric mood.    Patient Active Problem List   Diagnosis Date Noted  . Diabetes mellitus without complication (HCC) 06/12/2018  .  Essential hypertension 06/02/2018  . Complex tear of medial meniscus of right knee as current injury 11/06/2016  . Primary osteoarthritis of right knee 11/06/2016  . Auditory impairment 11/04/2015  . 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 Blood pressure elevated 11/04/2015  . Generalized psoriasis 11/04/2015  . Periodic limb movement 11/04/2015  . Hyperlipidemia 11/04/2015    No Known Allergies  Past Surgical History:  Procedure Laterality Date  . BLEPHAROPLASTY  2016  . COLONOSCOPY WITH PROPOFOL N/A 05/01/2016   Procedure: COLONOSCOPY WITH PROPOFOL;  Surgeon: Earline Mayotte, MD;  Location[facility] END[facility]COPY;  Service: Endoscopy;  Laterality: N/A;  . KNEE ARTHROSCOPY WITH MEDIAL MENISECTOMY Right 11/27/2016   Procedure: KNEE ARTHROSCOPY WITH MEDIAL MENISECTOMY;  Surgeon: Christena Flake, MD;  Location: [facility];  Service: Orthopedics;  Laterality: Right;  . SINUS SURGERY WITH INSTATRAK    . VASECTOMY      Social History   Tobacco Use  . Smoking status: Former Smoker    Last attempt to quit: 12/30/1985    Years since quitting: 32.8  . Smokeless tobacco: Never Used  Substance Use Topics  . Alcohol use: Yes    Alcohol/week: 4.0 standard drinks    Types: 4 Standard drinks or equivalent per week    Comment:    . Drug use: No     Medication list has been reviewed and updated.  Current Meds  Medication Sig  . amLODipine (NORVASC) 5 MG tablet Take 1 tablet (5 mg total) by mouth daily.  . clobetasol (TEMOVATE) 0.05 % external solution APPLY 1 APPLICATION TOPICALLY 2 (TWO) TIMES DAILY.  . clobetasol (TEMOVATE) 0.05 % GEL APPLY TO AFFECTED AREA TWICE A DAY  .  lisinopril (PRINIVIL,ZESTRIL) 10 MG tablet Take 1 tablet (10 mg total) by mouth daily.    PHQ 2/9 Scores 10/15/2018 06/02/2018 06/12/2016  PHQ - 2 Score 0 1 0    Physical Exam  Constitutional: He is oriented to person, place, and time. He  appears well-developed. No distress.  HENT:  Head: Normocephalic and atraumatic.  Neck: Normal range of motion. Neck supple.  Cardiovascular: Normal rate, regular rhythm and normal heart sounds.  Pulmonary/Chest: Effort normal and breath sounds normal. No respiratory distress.  Musculoskeletal: Normal range of motion.  Lymphadenopathy:    He has no cervical adenopathy.  Neurological: He is alert and oriented to person, place, and time.  Skin: Skin is warm and dry. No rash noted.  Psychiatric: He has a normal mood and affect. His behavior is normal. Thought content normal.  Nursing note and vitals reviewed.  Wt Readings from Last 3 Encounters:  10/15/18 296 lb (134.3 kg)  06/24/18 (!) 308 lb (139.7 kg)  06/02/18 (!) 317 lb (143.8 kg)    BP 118/80 (BP Location: Right Arm, Patient Position: Sitting, Cuff Size: Large)   Pulse 61   Ht 6\' 4"  (1.93 m)   Wt 296 lb (134.3 kg)   SpO2 98%   BMI 36.03 kg/m   Assessment and Plan: 1. Diabetes mellitus without complication (HCC) Hopefully will be improved with weight loss - Comprehensive metabolic panel - Hemoglobin A1c - Lipid panel  2. Essential hypertension Doing well on lisinopril - CBC with Differential/Platelet  3. Need for vaccination for pneumococcus - Pneumococcal conjugate vaccine 13-valent IM  4. Need for immunization against influenza - Flu Vaccine QUAD 36+ mos IM   Partially dictated using Animal nutritionist. Any errors are unintentional.  Bari Edward, MD [facility]  10/15/2018

## 2018-10-16 ENCOUNTER — Other Ambulatory Visit: Payer: Self-pay | Admitting: Internal Medicine

## 2018-10-16 DIAGNOSIS — E1169 Type 2 diabetes mellitus with other specified complication: Secondary | ICD-10-CM

## 2018-10-16 DIAGNOSIS — E785 Hyperlipidemia, unspecified: Principal | ICD-10-CM

## 2018-10-16 LAB — COMPREHENSIVE METABOLIC PANEL
A/G RATIO: 1.8 (ref 1.2–2.2)
ALT: 66 IU/L — AB (ref 0–44)
AST: 39 IU/L (ref 0–40)
Albumin: 4.9 g/dL — ABNORMAL HIGH (ref 3.6–4.8)
Alkaline Phosphatase: 78 IU/L (ref 39–117)
BUN/Creatinine Ratio: 15 (ref 10–24)
BUN: 13 mg/dL (ref 8–27)
Bilirubin Total: 0.3 mg/dL (ref 0.0–1.2)
CALCIUM: 9.5 mg/dL (ref 8.6–10.2)
CO2: 19 mmol/L — ABNORMAL LOW (ref 20–29)
Chloride: 104 mmol/L (ref 96–106)
Creatinine, Ser: 0.88 mg/dL (ref 0.76–1.27)
GFR calc non Af Amer: 90 mL/min/{1.73_m2} (ref >59)
GFR, EST AFRICAN AMERICAN: 103 mL/min/{1.73_m2} (ref >59)
GLOBULIN, TOTAL: 2.8 g/dL (ref 1.5–4.5)
Glucose: 93 mg/dL (ref 65–99)
POTASSIUM: 4.9 mmol/L (ref 3.5–5.2)
SODIUM: 142 mmol/L (ref 134–144)
TOTAL PROTEIN: 7.7 g/dL (ref 6.0–8.5)

## 2018-10-16 LAB — LIPID PANEL
CHOLESTEROL TOTAL: 218 mg/dL — AB (ref 100–199)
Chol/HDL Ratio: 5 ratio (ref 0.0–5.0)
HDL: 44 mg/dL (ref >39)
LDL Calculated: 154 mg/dL — ABNORMAL HIGH (ref 0–99)
Triglycerides: 100 mg/dL (ref 0–149)
VLDL Cholesterol Cal: 20 mg/dL (ref 5–40)

## 2018-10-16 LAB — CBC WITH DIFFERENTIAL/PLATELET
BASOS: 1 %
Basophils Absolute: 0 10*3/uL (ref 0.0–0.2)
EOS (ABSOLUTE): 0.4 10*3/uL (ref 0.0–0.4)
EOS: 5 %
HEMATOCRIT: 44.3 % (ref 37.5–51.0)
Hemoglobin: 14.8 g/dL (ref 13.0–17.7)
IMMATURE GRANS (ABS): 0 10*3/uL (ref 0.0–0.1)
IMMATURE GRANULOCYTES: 0 %
LYMPHS: 27 %
Lymphocytes Absolute: 1.8 10*3/uL (ref 0.7–3.1)
MCH: 32.4 pg (ref 26.6–33.0)
MCHC: 33.4 g/dL (ref 31.5–35.7)
MCV: 97 fL (ref 79–97)
Monocytes Absolute: 0.7 10*3/uL (ref 0.1–0.9)
Monocytes: 10 %
NEUTROS PCT: 57 %
Neutrophils Absolute: 3.8 10*3/uL (ref 1.4–7.0)
Platelets: 253 10*3/uL (ref 150–450)
RBC: 4.57 x10E6/uL (ref 4.14–5.80)
RDW: 13.4 % (ref 12.3–15.4)
WBC: 6.6 10*3/uL (ref 3.4–10.8)

## 2018-10-16 LAB — HEMOGLOBIN A1C
Est. average glucose Bld gHb Est-mCnc: 126 mg/dL
Hgb A1c MFr Bld: 6 % — ABNORMAL HIGH (ref 4.8–5.6)

## 2018-10-16 MED ORDER — ATORVASTATIN CALCIUM 10 MG PO TABS: 10.0000 mg | ORAL_TABLET | Freq: Every day | ORAL | 1 refills | Status: DC

## 2018-10-16 NOTE — Progress Notes (Signed)
Patient informed of all lab results. Willing to try medication for cholesterol. Wants sent to [facility] in [place]. Told him to call us if he has any issues with medication.

## 2018-11-27 DIAGNOSIS — M25462 Effusion, left knee: Secondary | ICD-10-CM | POA: Diagnosis not present

## 2018-11-27 DIAGNOSIS — M25562 Pain in left knee: Secondary | ICD-10-CM | POA: Diagnosis not present

## 2018-11-27 DIAGNOSIS — M1712 Unilateral primary osteoarthritis, left knee: Secondary | ICD-10-CM | POA: Diagnosis not present

## 2018-12-03 ENCOUNTER — Other Ambulatory Visit: Payer: Self-pay | Admitting: Orthopedic Surgery

## 2018-12-03 DIAGNOSIS — M2392 Unspecified internal derangement of left knee: Secondary | ICD-10-CM

## 2018-12-03 DIAGNOSIS — M1712 Unilateral primary osteoarthritis, left knee: Secondary | ICD-10-CM

## 2018-12-03 DIAGNOSIS — M25562 Pain in left knee: Secondary | ICD-10-CM

## 2018-12-03 DIAGNOSIS — M25362 Other instability, left knee: Secondary | ICD-10-CM

## 2018-12-09 ENCOUNTER — Other Ambulatory Visit: Payer: Self-pay | Admitting: Internal Medicine

## 2018-12-13 ENCOUNTER — Ambulatory Visit
Admission: RE | Admit: 2018-12-13 | Discharge: 2018-12-13 | Disposition: A | Discharge status: Home / Self Care | Payer: BLUE CROSS/BLUE SHIELD | Source: Ambulatory Visit | Attending: Orthopedic Surgery | Admitting: Orthopedic Surgery

## 2018-12-13 DIAGNOSIS — M2392 Unspecified internal derangement of left knee: Secondary | ICD-10-CM

## 2018-12-13 DIAGNOSIS — M25362 Other instability, left knee: Secondary | ICD-10-CM | POA: Insufficient documentation

## 2018-12-13 DIAGNOSIS — M25562 Pain in left knee: Secondary | ICD-10-CM | POA: Insufficient documentation

## 2018-12-13 DIAGNOSIS — M1712 Unilateral primary osteoarthritis, left knee: Secondary | ICD-10-CM | POA: Insufficient documentation

## 2018-12-14 ENCOUNTER — Ambulatory Visit
Admission: RE | Admit: 2018-12-14 | Discharge: 2018-12-14 | Disposition: A | Discharge status: Home / Self Care | Payer: BLUE CROSS/BLUE SHIELD | Source: Ambulatory Visit | Attending: Orthopedic Surgery | Admitting: Orthopedic Surgery

## 2018-12-14 DIAGNOSIS — M25562 Pain in left knee: Secondary | ICD-10-CM | POA: Diagnosis not present

## 2018-12-14 DIAGNOSIS — M1712 Unilateral primary osteoarthritis, left knee: Secondary | ICD-10-CM | POA: Diagnosis not present

## 2018-12-14 DIAGNOSIS — X58XXXA Exposure to other specified factors, initial encounter: Secondary | ICD-10-CM | POA: Diagnosis not present

## 2018-12-14 DIAGNOSIS — M25362 Other instability, left knee: Secondary | ICD-10-CM | POA: Insufficient documentation

## 2018-12-14 DIAGNOSIS — S83242A Other tear of medial meniscus, current injury, left knee, initial encounter: Secondary | ICD-10-CM | POA: Insufficient documentation

## 2018-12-18 ENCOUNTER — Ambulatory Visit: Payer: BLUE CROSS/BLUE SHIELD

## 2019-01-01 ENCOUNTER — Ambulatory Visit: Payer: BLUE CROSS/BLUE SHIELD

## 2019-01-04 ENCOUNTER — Other Ambulatory Visit: Payer: Self-pay | Admitting: Internal Medicine

## 2019-01-04 DIAGNOSIS — I10 Essential (primary) hypertension: Secondary | ICD-10-CM

## 2019-01-08 DIAGNOSIS — M1712 Unilateral primary osteoarthritis, left knee: Secondary | ICD-10-CM | POA: Diagnosis not present

## 2019-01-08 DIAGNOSIS — S83232A Complex tear of medial meniscus, current injury, left knee, initial encounter: Secondary | ICD-10-CM | POA: Diagnosis not present

## 2019-01-13 ENCOUNTER — Other Ambulatory Visit: Payer: Self-pay

## 2019-01-13 ENCOUNTER — Encounter
Admission: RE | Admit: 2019-01-13 | Discharge: 2019-01-13 | Disposition: A | Discharge status: Home / Self Care | Payer: BLUE CROSS/BLUE SHIELD | Source: Ambulatory Visit | Attending: Surgery | Admitting: Surgery

## 2019-01-13 HISTORY — DX: Contracture, unspecified hand: M24.549

## 2019-01-13 HISTORY — DX: Benign prostatic hyperplasia with lower urinary tract symptoms: N40.1

## 2019-01-13 HISTORY — DX: Essential (primary) hypertension: I10

## 2019-01-13 HISTORY — DX: Reserved for inherently not codable concepts without codable children: IMO0001

## 2019-01-13 HISTORY — DX: Unspecified hearing loss, unspecified ear: H91.90

## 2019-01-13 HISTORY — DX: Psoriasis, unspecified: L40.9

## 2019-01-13 HISTORY — DX: Benign prostatic hyperplasia with lower urinary tract symptoms: N13.8

## 2019-01-13 HISTORY — DX: Unspecified osteoarthritis, unspecified site: M19.90

## 2019-01-13 HISTORY — DX: Type 2 diabetes mellitus without complications: E11.9

## 2019-01-13 NOTE — Patient Instructions (Signed)
Your procedure is scheduled on: 01/14/19 1045 AM Report to Day Surgery. [facility] SECOND FLOOR   Remember: Instructions that are not followed completely may result in serious medical risk,  up to and including death, or upon the discretion of your surgeon and anesthesiologist your  surgery may need to be rescheduled.     _X__ 1. Do not eat food after midnight the night before your procedure.                 No gum chewing or hard candies. You may drink clear liquids up to 2 hours                 before you are scheduled to arrive for your surgery- DO not drink clear                 liquids within 2 hours of the start of your surgery.                 Clear Liquids include:  water, apple juice without pulp, clear carbohydrate                 drink such as Clearfast of Gatorade, Black Coffee or Tea (Do not add                 anything to coffee or tea).  __X__2.  On the morning of surgery brush your teeth with toothpaste and water, you                may rinse your mouth with mouthwash if you wish.  Do not swallow any toothpaste of mouthwash.     _X__ 3.  No Alcohol for 24 hours before or after surgery.   _X__ 4.  Do Not Smoke or use e-cigarettes For 24 Hours Prior to Your Surgery.                 Do not use any chewable tobacco products for at least 6 hours prior to                 surgery.  ____  5.  Bring all medications with you on the day of surgery if instructed.   __X__  6.  Notify your doctor if there is any change in your medical condition      (cold, fever, infections).     Do not wear jewelry, make-up, hairpins, clips or nail polish. Do not wear lotions, powders, or perfumes. You may wear deodorant. Do not shave 48 hours prior to surgery. Men may shave face and neck. Do not bring valuables to the hospital.    [facility] belongings or valuables.  Contacts, dentures or bridgework may not be worn into surgery. Leave your  suitcase in the car. After surgery it may be brought to your room. For patients admitted to the hospital, discharge time is determined by your treatment team.   Patients discharged the day of surgery will not be allowed to drive home.      ____ Take these medicines the morning of surgery with A SIP OF WATER:    1. NONE  2.   3.   4.  5.  6.  ____ Fleet Enema (as directed)   ____ Use CHG Soap as directed  ____ Use inhalers on the day of surgery  ____ Stop metformin 2 days prior to surgery    ____ Take 1/2 of usual insulin dose the night  before surgery. No insulin the morning          of surgery.   ____ Stop Coumadin/Plavix/aspirin on   _X___ Stop Anti-inflammatories on       NO MORE IBUPROFEN OR MELOXICAM UNTIL AFTER SURGERY   ____ Stop supplements until after surgery.    ____ Bring C-Pap to the hospital.

## 2019-01-14 ENCOUNTER — Other Ambulatory Visit: Payer: Self-pay

## 2019-01-14 ENCOUNTER — Encounter: Payer: Self-pay | Admitting: *Deleted

## 2019-01-14 ENCOUNTER — Encounter
Admission: RE | Disposition: A | Discharge status: Home / Self Care | Payer: Self-pay | Source: Home / Self Care | Attending: Surgery

## 2019-01-14 ENCOUNTER — Ambulatory Visit: Payer: BLUE CROSS/BLUE SHIELD | Admitting: Anesthesiology

## 2019-01-14 ENCOUNTER — Ambulatory Visit
Admission: RE | Admit: 2019-01-14 | Discharge: 2019-01-14 | Disposition: A | Discharge status: Home / Self Care | Payer: BLUE CROSS/BLUE SHIELD | Attending: Surgery | Admitting: Surgery

## 2019-01-14 DIAGNOSIS — M94262 Chondromalacia, left knee: Secondary | ICD-10-CM | POA: Diagnosis not present

## 2019-01-14 DIAGNOSIS — E785 Hyperlipidemia, unspecified: Secondary | ICD-10-CM | POA: Insufficient documentation

## 2019-01-14 DIAGNOSIS — Z79899 Other long term (current) drug therapy: Secondary | ICD-10-CM | POA: Insufficient documentation

## 2019-01-14 DIAGNOSIS — N401 Enlarged prostate with lower urinary tract symptoms: Secondary | ICD-10-CM | POA: Diagnosis not present

## 2019-01-14 DIAGNOSIS — E119 Type 2 diabetes mellitus without complications: Secondary | ICD-10-CM | POA: Diagnosis not present

## 2019-01-14 DIAGNOSIS — Z87891 Personal history of nicotine dependence: Secondary | ICD-10-CM | POA: Diagnosis not present

## 2019-01-14 DIAGNOSIS — N138 Other obstructive and reflux uropathy: Secondary | ICD-10-CM | POA: Insufficient documentation

## 2019-01-14 DIAGNOSIS — M1712 Unilateral primary osteoarthritis, left knee: Secondary | ICD-10-CM | POA: Insufficient documentation

## 2019-01-14 DIAGNOSIS — X58XXXA Exposure to other specified factors, initial encounter: Secondary | ICD-10-CM | POA: Insufficient documentation

## 2019-01-14 DIAGNOSIS — I10 Essential (primary) hypertension: Secondary | ICD-10-CM | POA: Diagnosis not present

## 2019-01-14 DIAGNOSIS — S83231A Complex tear of medial meniscus, current injury, right knee, initial encounter: Secondary | ICD-10-CM | POA: Diagnosis not present

## 2019-01-14 DIAGNOSIS — S83232A Complex tear of medial meniscus, current injury, left knee, initial encounter: Secondary | ICD-10-CM | POA: Diagnosis not present

## 2019-01-14 HISTORY — PX: KNEE ARTHROSCOPY: SHX127

## 2019-01-14 LAB — GLUCOSE, CAPILLARY
Glucose-Capillary: 100 mg/dL — ABNORMAL HIGH (ref 70–99)
Glucose-Capillary: 105 mg/dL — ABNORMAL HIGH (ref 70–99)

## 2019-01-14 SURGERY — ARTHROSCOPY, KNEE
Anesthesia: General | Laterality: Left

## 2019-01-14 MED ORDER — PHENYLEPHRINE HCL 10 MG/ML IJ SOLN
INTRAMUSCULAR | Status: DC | PRN
  Administered 2019-01-14: 100 ug via INTRAVENOUS

## 2019-01-14 MED ORDER — HYDROCODONE-ACETAMINOPHEN 5-325 MG PO TABS: 1.0000 | ORAL_TABLET | Freq: Four times a day (QID) | ORAL | 0 refills | Status: DC | PRN

## 2019-01-14 MED ORDER — PROPOFOL 10 MG/ML IV BOLUS
INTRAVENOUS | Status: AC
  Filled 2019-01-14: qty 20

## 2019-01-14 MED ORDER — OXYCODONE HCL 5 MG PO TABS: 5.0000 mg | ORAL_TABLET | Freq: Once | ORAL | Status: DC | PRN

## 2019-01-14 MED ORDER — OXYCODONE HCL 5 MG/5ML PO SOLN: 5.0000 mg | Freq: Once | ORAL | Status: DC | PRN

## 2019-01-14 MED ORDER — PROPOFOL 10 MG/ML IV BOLUS
INTRAVENOUS | Status: DC | PRN
  Administered 2019-01-14: 200 mg via INTRAVENOUS

## 2019-01-14 MED ORDER — DEXAMETHASONE SODIUM PHOSPHATE 10 MG/ML IJ SOLN
INTRAMUSCULAR | Status: DC | PRN
  Administered 2019-01-14: 5 mg via INTRAVENOUS

## 2019-01-14 MED ORDER — FENTANYL CITRATE (PF) 250 MCG/5ML IJ SOLN
INTRAMUSCULAR | Status: AC
  Filled 2019-01-14: qty 5

## 2019-01-14 MED ORDER — FENTANYL CITRATE (PF) 100 MCG/2ML IJ SOLN
INTRAMUSCULAR | Status: DC | PRN
  Administered 2019-01-14: 50 ug via INTRAVENOUS
  Administered 2019-01-14: 100 ug via INTRAVENOUS
  Administered 2019-01-14 (×2): 50 ug via INTRAVENOUS

## 2019-01-14 MED ORDER — MELOXICAM 7.5 MG PO TABS: 7.5000 mg | ORAL_TABLET | Freq: Every day | ORAL | 3 refills | Status: DC

## 2019-01-14 MED ORDER — FAMOTIDINE 20 MG PO TABS
20.0000 mg | ORAL_TABLET | Freq: Once | ORAL | Status: AC
  Administered 2019-01-14: 20 mg via ORAL

## 2019-01-14 MED ORDER — CEFAZOLIN SODIUM-DEXTROSE 2-4 GM/100ML-% IV SOLN
INTRAVENOUS | Status: AC
  Filled 2019-01-14: qty 100

## 2019-01-14 MED ORDER — LIDOCAINE HCL (CARDIAC) PF 100 MG/5ML IV SOSY
PREFILLED_SYRINGE | INTRAVENOUS | Status: DC | PRN
  Administered 2019-01-14: 80 mg via INTRAVENOUS

## 2019-01-14 MED ORDER — LIDOCAINE HCL 1 % IJ SOLN
INTRAMUSCULAR | Status: DC | PRN
  Administered 2019-01-14: 30 mL

## 2019-01-14 MED ORDER — LIDOCAINE HCL (PF) 1 % IJ SOLN
INTRAMUSCULAR | Status: AC
  Filled 2019-01-14: qty 30

## 2019-01-14 MED ORDER — DEXTROSE 5 % IV SOLN: 2000.0000 mg | Freq: Once | INTRAVENOUS | Status: DC

## 2019-01-14 MED ORDER — ONDANSETRON HCL 4 MG/2ML IJ SOLN
INTRAMUSCULAR | Status: DC | PRN
  Administered 2019-01-14: 4 mg via INTRAVENOUS

## 2019-01-14 MED ORDER — FENTANYL CITRATE (PF) 100 MCG/2ML IJ SOLN: 25.0000 ug | INTRAMUSCULAR | Status: DC | PRN

## 2019-01-14 MED ORDER — BUPIVACAINE-EPINEPHRINE (PF) 0.5% -1:200000 IJ SOLN
INTRAMUSCULAR | Status: AC
  Filled 2019-01-14: qty 60

## 2019-01-14 MED ORDER — CEFAZOLIN SODIUM-DEXTROSE 2-4 GM/100ML-% IV SOLN
2.0000 g | Freq: Once | INTRAVENOUS | Status: AC
  Administered 2019-01-14: 2 g via INTRAVENOUS

## 2019-01-14 MED ORDER — BUPIVACAINE-EPINEPHRINE (PF) 0.5% -1:200000 IJ SOLN
INTRAMUSCULAR | Status: DC | PRN
  Administered 2019-01-14: 50 mL via PERINEURAL

## 2019-01-14 MED ORDER — FAMOTIDINE 20 MG PO TABS
ORAL_TABLET | ORAL | Status: AC
  Administered 2019-01-14: 20 mg via ORAL
  Filled 2019-01-14: qty 1

## 2019-01-14 MED ORDER — SODIUM CHLORIDE 0.9 % IV SOLN
INTRAVENOUS | Status: DC
  Administered 2019-01-14 (×2): via INTRAVENOUS

## 2019-01-14 SURGICAL SUPPLY — 35 items
"PENCIL ELECTRO HAND CTR " (MISCELLANEOUS) ×1 IMPLANT
BAG COUNTER SPONGE EZ (MISCELLANEOUS) IMPLANT
BANDAGE ACE 6X5 VEL STRL LF (GAUZE/BANDAGES/DRESSINGS) ×2 IMPLANT
BLADE FULL RADIUS 3.5 (BLADE) ×2 IMPLANT
BLADE SHAVER 4.5X7 STR FR (MISCELLANEOUS) ×2 IMPLANT
CHLORAPREP W/TINT 26ML (MISCELLANEOUS) ×2 IMPLANT
COVER WAND RF STERILE (DRAPES) ×2 IMPLANT
CUFF TOURN 24 STER (MISCELLANEOUS) IMPLANT
CUFF TOURN 30 STER DUAL PORT (MISCELLANEOUS) ×1 IMPLANT
DRAPE IMP U-DRAPE 54X76 (DRAPES) ×2 IMPLANT
ELECT REM PT RETURN 9FT ADLT (ELECTROSURGICAL) ×2
ELECTRODE REM PT RTRN 9FT ADLT (ELECTROSURGICAL) ×1 IMPLANT
GAUZE SPONGE 4X4 12PLY STRL (GAUZE/BANDAGES/DRESSINGS) ×2 IMPLANT
GLOVE BIO SURGEON STRL SZ8 (GLOVE) ×4 IMPLANT
GLOVE BIOGEL M 7.0 STRL (GLOVE) ×4 IMPLANT
GLOVE BIOGEL PI IND STRL 7.5 (GLOVE) ×1 IMPLANT
GLOVE BIOGEL PI INDICATOR 7.5 (GLOVE) ×1
GLOVE INDICATOR 8.0 STRL GRN (GLOVE) ×2 IMPLANT
GOWN STRL REUS W/ TWL LRG LVL3 (GOWN DISPOSABLE) ×1 IMPLANT
GOWN STRL REUS W/ TWL XL LVL3 (GOWN DISPOSABLE) ×2 IMPLANT
GOWN STRL REUS W/TWL LRG LVL3 (GOWN DISPOSABLE) ×1
GOWN STRL REUS W/TWL XL LVL3 (GOWN DISPOSABLE) ×2
IV LACTATED RINGER IRRG 3000ML (IV SOLUTION) ×1
IV LR IRRIG 3000ML ARTHROMATIC (IV SOLUTION) ×1 IMPLANT
KIT TURNOVER KIT A (KITS) ×2 IMPLANT
MANIFOLD NEPTUNE II (INSTRUMENTS) ×2 IMPLANT
NDL HYPO 21X1.5 SAFETY (NEEDLE) ×1 IMPLANT
NEEDLE HYPO 21X1.5 SAFETY (NEEDLE) ×2 IMPLANT
PACK ARTHROSCOPY KNEE (MISCELLANEOUS) ×2 IMPLANT
PENCIL ELECTRO HAND CTR (MISCELLANEOUS) ×2 IMPLANT
SUT PROLENE 4 0 PS 2 18 (SUTURE) ×2 IMPLANT
SUT TICRON COATED BLUE 2 0 30 (SUTURE) ×1 IMPLANT
SYR 50ML LL SCALE MARK (SYRINGE) ×2 IMPLANT
TUBING ARTHRO INFLOW-ONLY STRL (TUBING) ×2 IMPLANT
WAND WEREWOLF FLOW 90D (MISCELLANEOUS) ×2 IMPLANT

## 2019-01-14 NOTE — Op Note (Signed)
01/14/2019  2:16 PM  Patient:   Benjamin Medina  Pre-Op Diagnosis:   Complex medial meniscus tear with underlying degenerative joint disease, left knee.  Postoperative diagnosis:   Same  Procedure:   Arthroscopic partial medial meniscectomy with abrasion chondroplasty of grade 3 chondromalacial changes of the medial femoral condyle, left knee.  Surgeon:   Maryagnes Amos, M.D.  Anesthesia:   General LMA.  Findings:   As above.  There also were grade 1-2 chondromalacial changes involving the central ridge of the patella, the femoral trochlea, the medial tibial plateau, and focally over the posterocentral portion of the lateral tibial plateau.  Complications:   None.  EBL:   3 cc.  Total fluids:   300 cc of crystalloid.  Tourniquet time:   None  Drains:   None  Closure:   4-0 Prolene interrupted sutures.  Brief clinical note:   The patient is a 68 year old male with a several month history of medial sided left knee pain. His symptoms have progressed despite medications, activity modification, etc. His history examination consistent with a medial meniscus tear which was confirmed by MRI scan. The patient presents at this time for arthroscopy, debridement, and partial left meniscectomy.  Procedure:   The patient was brought into the operating room and lain in the supine position. After adequate general laryngeal mask anesthesia was obtained, a timeout was performed to verify the appropriate side. The patient's left knee was injected sterilely using a solution of 30 cc of 1% lidocaine and 30 cc of 0.5% Sensorcaine with epinephrine. The left lower extremity was prepped with ChloraPrep solution before being draped sterilely. Preoperative antibiotics were administered. The expected portal sites were injected with 0.5% Sensorcaine with epinephrine before the camera was placed in the anterolateral portal and instrumentation performed through the anteromedial portal.   The knee was  sequentially examined beginning in the suprapatellar pouch, then progressing to the patellofemoral space, the medial gutter compartment, the notch, and finally the lateral compartment and gutter. The findings were as described above. Abundant reactive synovial tissues anteriorly were debrided using the full-radius resector in order to improve visualization. A flap of posterior medial meniscus had flipped over on itself toward the notch and appeared to be abrading the lateral-most portion of the medial femoral condyle weightbearing surface. This unstable component was debrided back to stable margins using accommodation of the full-radius resector and straight and up-biting baskets. In addition, the remaining portion of the posterior horn of the medial meniscus also was debrided back to stable margins using these instruments. Subsequent probing of the remaining rim demonstrated good stability. The areas of grade III chondromalacia involving the weightbearing portion of the medial femoral condyle also were debrided back to stable margins using the full-radius resector. The instruments were removed from the joint after suctioning the excess fluid.   The portal sites were closed using 4-0 Prolene interrupted sutures before a sterile bulky dressing was applied to the knee. The patient was then awakened, extubated, and returned to the recovery room in satisfactory condition after tolerating the procedure well.

## 2019-01-14 NOTE — Transfer of Care (Signed)
Immediate Anesthesia Transfer of Care Note  Patient: Benjamin Medina  Procedure(s) Performed: ARTHROSCOPY KNEE WITH DEBRIDEMENT AND PARTIAL MEDIAL MENISCECTOMY (Left )  Patient Location: PACU  Anesthesia Type:General  Level of Consciousness: awake  Airway & Oxygen Therapy: Patient Spontanous Breathing  Post-op Assessment: Report given to RN  Post vital signs: stable  Last Vitals:  Vitals Value Taken Time  BP    Temp    Pulse    Resp    SpO2      Last Pain:  Vitals:   01/14/19 1121  TempSrc: Temporal  PainSc: 0-No pain         Complications: No apparent anesthesia complications

## 2019-01-14 NOTE — H&P (Signed)
Paper H&P to be scanned into permanent record. H&P reviewed and patient re-examined. No changes. 

## 2019-01-14 NOTE — Discharge Instructions (Addendum)
AMBULATORY SURGERY  DISCHARGE INSTRUCTIONS   1) The drugs that you were given will stay in your system until tomorrow so for the next 24 hours you should not:  A) Drive an automobile B) Make any legal decisions C) Drink any alcoholic beverage   2) You may resume regular meals tomorrow.  Today it is better to start with liquids and gradually work up to solid foods.  You may eat anything you prefer, but it is better to start with liquids, then soup and crackers, and gradually work up to solid foods.   3) Please notify your doctor immediately if you have any unusual bleeding, trouble breathing, redness and pain at the surgery site, drainage, fever, or pain not relieved by medication.    4) Additional Instructions:        Please contact your physician with any problems or Same Day Surgery at [phone], Monday through Friday 6 am to 4 pm, or [place] [facility] number at [phone].Orthopedic discharge instructions: Keep dressing dry and intact.  May shower after dressing changed on post-op day #4 (Monday).  Cover sutures with Band-Aids after drying off. Apply ice frequently to knee. Take Mobic 7.5 mg daily OR ibuprofen 600-800 mg TID with meals for 7-10 days, then as necessary. Take pain medication as prescribed or ES Tylenol when needed.  May weight-bear as tolerated - use crutches or walker as needed. Follow-up in 10-14 days or as scheduled.

## 2019-01-14 NOTE — Anesthesia Post-op Follow-up Note (Signed)
Anesthesia QCDR form completed.        

## 2019-01-14 NOTE — Anesthesia Postprocedure Evaluation (Signed)
Anesthesia Post Note  Patient: Benjamin Medina  Procedure(s) Performed: ARTHROSCOPY KNEE WITH DEBRIDEMENT AND PARTIAL MEDIAL MENISCECTOMY (Left )  Patient location during evaluation: PACU Anesthesia Type: General Level of consciousness: awake and alert Pain management: pain level controlled Vital Signs Assessment: post-procedure vital signs reviewed and stable Respiratory status: spontaneous breathing, nonlabored ventilation, respiratory function stable and patient connected to nasal cannula oxygen Cardiovascular status: blood pressure returned to baseline and stable Postop Assessment: no apparent nausea or vomiting Anesthetic complications: no     Last Vitals:  Vitals:   01/14/19 1440 01/14/19 1446  BP: 121/82 (!) 146/77  Pulse: (!) 57 (!) 57  Resp: 15 16  Temp:  36.6 C  SpO2: 100% 100%    Last Pain:  Vitals:   01/14/19 1446  TempSrc: Temporal  PainSc: 0-No pain                 Cleda Mccreedy [name]

## 2019-01-14 NOTE — Anesthesia Preprocedure Evaluation (Signed)
Anesthesia Evaluation  Patient identified by MRN, date of birth, ID band Patient awake    Reviewed: Allergy & Precautions, H&P , NPO status , Patient's Chart, lab work & pertinent test results, reviewed documented beta blocker date and time   History of Anesthesia Complications Negative for: history of anesthetic complications  Airway Mallampati: II  TM Distance: >3 FB Neck ROM: full    Dental  (+) Chipped, Poor Dentition   Pulmonary neg shortness of breath, former smoker,    Pulmonary exam normal breath sounds clear to auscultation       Cardiovascular Exercise Tolerance: Good hypertension,  Rhythm:regular Rate:Normal     Neuro/Psych Hard of hearing negative psych ROS   GI/Hepatic negative GI ROS, Neg liver ROS, neg GERD  ,  Endo/Other  diabetes, Type 2  Renal/GU negative Renal ROS  negative genitourinary   Musculoskeletal  (+) Arthritis ,   Abdominal (+) + obese,   Peds  Hematology negative hematology ROS (+)   Anesthesia Other Findings Past Medical History: No date: Arthritis No date: 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 No date: Diabetes mellitus without complication (HCC) No date: Hypertension No date: Psoriasis  BMI    Body Mass Index:  37.30 kg/m    Past Surgical History: 2016: BLEPHAROPLASTY 05/01/2016: COLONOSCOPY WITH PROPOFOL; N/A     Comment:  Procedure: COLONOSCOPY WITH PROPOFOL;  Surgeon: Earline Mayotte, MD;  Location: [facility] ENDOSCOPY;  Service:               Endoscopy;  Laterality: N/A; 11/27/2016: KNEE ARTHROSCOPY WITH MEDIAL MENISECTOMY; Right     Comment:  Procedure: KNEE ARTHROSCOPY WITH MEDIAL MENISECTOMY;                Surgeon: Christena Flake, MD;  Location: [facility]               [facility];  Service: Orthopedics;  Laterality: Right; No date: SINUS SURGERY WITH INSTATRAK No date:  VASECTOMY   Reproductive/Obstetrics negative OB ROS                             Anesthesia Physical  Anesthesia Plan  ASA: III  Anesthesia Plan: General LMA   Post-op Pain Management:    Induction: Intravenous  PONV Risk Score and Plan: Ondansetron, Dexamethasone and Midazolam  Airway Management Planned: LMA  Additional Equipment:   Intra-op Plan:   Post-operative Plan: Extubation in OR  Informed Consent: I have reviewed the patients History and Physical, chart, labs and discussed the procedure including the risks, benefits and alternatives for the proposed anesthesia with the patient or authorized representative who has indicated his/her understanding and acceptance.     Dental Advisory Given  Plan Discussed with: CRNA  Anesthesia Plan Comments: (Patient consented for risks of anesthesia including but not limited to:  - adverse reactions to medications - damage to teeth, lips or other oral mucosa - sore throat or hoarseness - Damage to heart, brain, lungs or loss of life  Patient voiced understanding.)        Anesthesia Quick Evaluation

## 2019-01-15 ENCOUNTER — Encounter: Payer: Self-pay | Admitting: Surgery

## 2019-01-18 NOTE — Addendum Note (Signed)
Addendum  created 01/18/19 0223 by Stormy Fabian, CRNA   Charge Capture section accepted

## 2019-02-04 ENCOUNTER — Other Ambulatory Visit: Payer: Self-pay | Admitting: Internal Medicine

## 2019-02-04 DIAGNOSIS — I10 Essential (primary) hypertension: Secondary | ICD-10-CM

## 2019-03-15 ENCOUNTER — Other Ambulatory Visit: Payer: Self-pay | Admitting: Internal Medicine

## 2019-03-15 DIAGNOSIS — L409 Psoriasis, unspecified: Secondary | ICD-10-CM

## 2019-03-16 ENCOUNTER — Other Ambulatory Visit: Payer: Self-pay | Admitting: Internal Medicine

## 2019-03-16 MED ORDER — CLOBETASOL PROPIONATE 0.05 % EX SOLN: 1.0000 "application " | Freq: Two times a day (BID) | CUTANEOUS | 2 refills | Status: DC

## 2019-03-24 ENCOUNTER — Encounter: Payer: Self-pay | Admitting: Internal Medicine

## 2019-03-24 ENCOUNTER — Other Ambulatory Visit: Payer: Self-pay

## 2019-03-24 ENCOUNTER — Ambulatory Visit (INDEPENDENT_AMBULATORY_CARE_PROVIDER_SITE_OTHER): Payer: BLUE CROSS/BLUE SHIELD | Admitting: Internal Medicine

## 2019-03-24 VITALS — BP 112/78 | HR 68 | Ht 76.0 in | Wt 310.0 lb

## 2019-03-24 DIAGNOSIS — E1169 Type 2 diabetes mellitus with other specified complication: Secondary | ICD-10-CM

## 2019-03-24 DIAGNOSIS — E118 Type 2 diabetes mellitus with unspecified complications: Secondary | ICD-10-CM | POA: Diagnosis not present

## 2019-03-24 DIAGNOSIS — I10 Essential (primary) hypertension: Secondary | ICD-10-CM

## 2019-03-24 DIAGNOSIS — E785 Hyperlipidemia, unspecified: Secondary | ICD-10-CM

## 2019-03-24 DIAGNOSIS — E66812 Obesity, class 2: Secondary | ICD-10-CM | POA: Insufficient documentation

## 2019-03-24 DIAGNOSIS — Z0000 Encounter for general adult medical examination without abnormal findings: Secondary | ICD-10-CM | POA: Diagnosis not present

## 2019-03-24 DIAGNOSIS — N401 Enlarged prostate with lower urinary tract symptoms: Secondary | ICD-10-CM | POA: Diagnosis not present

## 2019-03-24 DIAGNOSIS — H6121 Impacted cerumen, right ear: Secondary | ICD-10-CM

## 2019-03-24 DIAGNOSIS — N138 Other obstructive and reflux uropathy: Secondary | ICD-10-CM

## 2019-03-24 DIAGNOSIS — H9193 Unspecified hearing loss, bilateral: Secondary | ICD-10-CM

## 2019-03-24 LAB — POCT URINALYSIS DIPSTICK
Bilirubin, UA: NEGATIVE
Blood, UA: NEGATIVE
Glucose, UA: NEGATIVE
Ketones, UA: NEGATIVE
Leukocytes, UA: NEGATIVE
Nitrite, UA: NEGATIVE
Protein, UA: NEGATIVE
Spec Grav, UA: 1.015 (ref 1.010–1.025)
Urobilinogen, UA: 0.2 E.U./dL
pH, UA: 5 (ref 5.0–8.0)

## 2019-03-24 MED ORDER — AMLODIPINE BESYLATE 5 MG PO TABS: 5.0000 mg | ORAL_TABLET | Freq: Every day | ORAL | 3 refills | Status: DC

## 2019-03-24 MED ORDER — ATORVASTATIN CALCIUM 10 MG PO TABS: 10.0000 mg | ORAL_TABLET | Freq: Every day | ORAL | 3 refills | Status: DC

## 2019-03-24 MED ORDER — LISINOPRIL 10 MG PO TABS: 10.0000 mg | ORAL_TABLET | Freq: Every day | ORAL | 3 refills | Status: DC

## 2019-03-24 NOTE — Patient Instructions (Signed)
SCHEDULE AN EYE EXAM

## 2019-03-24 NOTE — Progress Notes (Signed)
Date:  03/24/2019   Name:  Benjamin Medina   DOB:  10-08-1951   MRN:  [id number]   Chief Complaint: Annual Exam [name] is a 68 y.o. male who presents today for his Complete Annual Exam. He feels well. He reports exercising some since his knee surgery - just now released to walking. He reports he is sleeping well.  Colonoscopy done 2017 Prevnar 09/2018 Last Eye Exam not charted  Hypertension  This is a chronic problem. The problem is unchanged. The problem is controlled. Pertinent negatives include no chest pain, headaches, palpitations or shortness of breath. Past treatments include calcium channel blockers and ACE inhibitors. The current treatment provides significant improvement. There is no history of kidney disease or CAD/MI.  Hyperlipidemia  This is a chronic problem. Pertinent negatives include no chest pain, myalgias or shortness of breath. Current antihyperlipidemic treatment includes statins (atorvastain started last visit). Risk factors for coronary artery disease include diabetes mellitus and dyslipidemia.  Diabetes  He presents for his follow-up diabetic visit. He has type 2 diabetes mellitus. The initial diagnosis of diabetes was made 1 year ago. His disease course has been stable. Pertinent negatives for hypoglycemia include no dizziness or headaches. Pertinent negatives for diabetes include no chest pain and no fatigue. Current diabetic treatment includes diet. He is compliant with treatment most of the time. His weight is stable. He is following a generally healthy diet. An ACE inhibitor/angiotensin II receptor blocker is being taken. Eye exam is not current.  Benign Prostatic Hypertrophy  This is a chronic problem. The problem is unchanged. Irritative symptoms include frequency (nocturia x 2). Irritative symptoms do not include urgency. Pertinent negatives include no chills or hematuria. Past treatments include nothing.   IPSS Questionnaire (AUA-7): Over the past  month.   1)  How often have you had a sensation of not emptying your bladder completely after you finish urinating?  3 - About half the time  2)  How often have you had to urinate again less than two hours after you finished urinating? 4 - More than half the time  3)  How often have you found you stopped and started again several times when you urinated?  4 - More than half the time  4) How difficult have you found it to postpone urination?  5 - Almost always  5) How often have you had a weak urinary stream?  1 - Less than 1 time in 5  6) How often have you had to push or strain to begin urination?  0 - Not at all  7) How many times did you most typically get up to urinate from the time you went to bed until the time you got up in the morning?  2 - 2 times  Total score:  0-7 mildly symptomatic  19 8-19 moderately symptomatic   20-35 severely symptomatic    Lab Results  Component Value Date   HGBA1C 6.0 (H) 10/15/2018   Lab Results  Component Value Date   CREATININE 0.88 10/15/2018   BUN 13 10/15/2018   NA 142 10/15/2018   K 4.9 10/15/2018   CL 104 10/15/2018   CO2 19 (L) 10/15/2018   Lab Results  Component Value Date   CHOL 218 (H) 10/15/2018   HDL 44 10/15/2018   LDLCALC 154 (H) 10/15/2018   TRIG 100 10/15/2018   CHOLHDL 5.0 10/15/2018     Review of Systems  Constitutional: Negative for chills, fatigue and fever.  HENT: Positive for hearing loss. Negative for ear pain, sinus pain and sore throat.   Eyes: Negative for visual disturbance.  Respiratory: Negative for cough, chest tightness, shortness of breath and wheezing.   Cardiovascular: Negative for chest pain, palpitations and leg swelling.  Gastrointestinal: Negative for abdominal pain, constipation and diarrhea.  Genitourinary: Positive for frequency (nocturia x 2). Negative for difficulty urinating, hematuria and urgency.  Musculoskeletal: Positive for arthralgias. Negative for gait problem, joint swelling and  myalgias.  Skin: Negative for color change and wound.  Allergic/Immunologic: Negative for environmental allergies.  Neurological: Negative for dizziness and headaches.  Hematological: Negative for adenopathy.  Psychiatric/Behavioral: Negative for dysphoric mood and sleep disturbance.    Patient Active Problem List   Diagnosis Date Noted  . DM type 2, controlled, with complication (HCC) 06/12/2018  . Essential hypertension 06/02/2018  . Complex tear of medial meniscus of right knee as current injury 11/06/2016  . Primary osteoarthritis of right knee 11/06/2016  . Auditory impairment 11/04/2015  . 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 Generalized psoriasis 11/04/2015  . Periodic limb movement 11/04/2015  . Hyperlipidemia 11/04/2015    No Known Allergies  Past Surgical History:  Procedure Laterality Date  . BLEPHAROPLASTY  2016  . COLONOSCOPY WITH PROPOFOL N/A 05/01/2016   Procedure: COLONOSCOPY WITH PROPOFOL;  Surgeon: Earline Mayotte, MD;  Location: [facility] ENDOSCOPY;  Service: Endoscopy;  Laterality: N/A;  . KNEE ARTHROSCOPY Left 01/14/2019   Procedure: ARTHROSCOPY KNEE WITH DEBRIDEMENT AND PARTIAL MEDIAL MENISCECTOMY;  Surgeon: Christena Flake, MD;  Location: [facility] ORS;  Service: Orthopedics;  Laterality: Left;  . KNEE ARTHROSCOPY WITH MEDIAL MENISECTOMY Right 11/27/2016   Procedure: KNEE ARTHROSCOPY WITH MEDIAL MENISECTOMY;  Surgeon: Christena Flake, MD;  Location: [facility];  Service: Orthopedics;  Laterality: Right;  . SINUS SURGERY WITH INSTATRAK    . VASECTOMY      Social History   Tobacco Use  . Smoking status: Former Smoker    Last attempt to quit: 12/30/1985    Years since quitting: 33.2  . Smokeless tobacco: Never Used  Substance Use Topics  . Alcohol use: Yes    Alcohol/week: 4.0 standard drinks    Types: 4 Standard drinks or equivalent per week    Comment:    . Drug use: No      Medication list has been reviewed and updated.  Current Meds  Medication Sig  . amLODipine (NORVASC) 5 MG tablet TAKE 1 TABLET BY MOUTH EVERY DAY  . atorvastatin (LIPITOR) 10 MG tablet Take 1 tablet (10 mg total) by mouth daily.  . clobetasol (TEMOVATE) 0.05 % external solution Apply 1 application topically 2 (two) times daily.  Marland Kitchen lisinopril (PRINIVIL,ZESTRIL) 10 MG tablet TAKE 1 TABLET BY MOUTH EVERY DAY (Patient taking differently: Take 10 mg by mouth daily. )  . meloxicam (MOBIC) 7.5 MG tablet Take 1 tablet (7.5 mg total) by mouth daily.    PHQ 2/9 Scores 03/24/2019 10/15/2018 06/02/2018 06/12/2016  PHQ - 2 Score 0 0 1 0    Physical Exam Vitals signs and nursing note reviewed.  Constitutional:      Appearance: Normal appearance. He is well-developed.  HENT:     Head: Normocephalic.     Right Ear: External ear normal. There is impacted cerumen.     Left Ear: Tympanic membrane, ear canal and external ear normal.     Nose: Nose normal.     Mouth/Throat:  Mouth: Mucous membranes are moist.     Pharynx: Uvula midline.  Eyes:     Conjunctiva/sclera: Conjunctivae normal.     Pupils: Pupils are equal, round, and reactive to light.  Neck:     Musculoskeletal: Normal range of motion and neck supple.     Thyroid: No thyromegaly.     Vascular: No carotid bruit.  Cardiovascular:     Rate and Rhythm: Normal rate and regular rhythm.     Pulses: Normal pulses.     Heart sounds: Normal heart sounds.  Pulmonary:     Effort: Pulmonary effort is normal.     Breath sounds: Normal breath sounds. No wheezing.  Chest:     Breasts:        Right: No mass.        Left: No mass.  Abdominal:     General: Bowel sounds are normal.     Palpations: Abdomen is soft.     Tenderness: There is no abdominal tenderness.  Musculoskeletal: Normal range of motion.     Right knee: He exhibits normal range of motion, no swelling and no effusion.     Left knee: He exhibits normal range of motion and no  effusion.  Lymphadenopathy:     Cervical: No cervical adenopathy.  Skin:    General: Skin is warm and dry.  Neurological:     Mental Status: He is alert and oriented to person, place, and time.     Deep Tendon Reflexes: Reflexes are normal and symmetric.  Psychiatric:        Attention and Perception: Attention normal.        Mood and Affect: Mood normal.        Speech: Speech normal.        Behavior: Behavior normal.        Thought Content: Thought content normal.        Judgment: Judgment normal.     Wt Readings from Last 3 Encounters:  03/24/19 (!) 310 lb (140.6 kg)  01/14/19 (!) 306 lb 7 oz (139 kg)  01/13/19 (!) 306 lb (138.8 kg)    BP 112/78   Pulse 68   Ht  (1.93 m)   Wt (!) 310 lb (140.6 kg)   SpO2 97%   BMI 37.73 kg/m   Assessment and Plan: 1. Annual physical exam Recommend regular exercise and healthy diet - POCT urinalysis dipstick  2. Essential hypertension controlled - CBC with Differential/Platelet - amLODipine (NORVASC) 5 MG tablet; Take 1 tablet (5 mg total) by mouth daily.  Dispense: 90 tablet; Refill: 3 - lisinopril (PRINIVIL,ZESTRIL) 10 MG tablet; Take 1 tablet (10 mg total) by mouth daily.  Dispense: 90 tablet; Refill: 3  3. DM type 2, controlled, with complication (HCC) Discussed diet, need for weight loss, and regular exercise Patient to schedule DM eye exam - Comprehensive metabolic panel - Hemoglobin A1c - TSH  4. Benign prostatic hyperplasia with urinary obstruction DRE deferred, pt not concerned about sx - PSA  5. Hyperlipidemia associated with type 2 diabetes mellitus (HCC) On statin therapy with no side effects - Lipid panel - atorvastatin (LIPITOR) 10 MG tablet; Take 1 tablet (10 mg total) by mouth daily.  Dispense: 90 tablet; Refill: 3  6. Severe obesity (HCC) Discussed weight loss, exercise and diet changes  7. Impacted cerumen of right ear Referred to ENT  8. Hearing difficulty of both ears Referred to ENT    Partially dictated using Dragon software. Any errors are unintentional.  Bari Edward, MD [facility]  03/24/2019

## 2019-03-25 LAB — COMPREHENSIVE METABOLIC PANEL
ALT: 64 IU/L — ABNORMAL HIGH (ref 0–44)
AST: 39 IU/L (ref 0–40)
Albumin/Globulin Ratio: 1.9 (ref 1.2–2.2)
Albumin: 5.1 g/dL — ABNORMAL HIGH (ref 3.8–4.8)
Alkaline Phosphatase: 75 IU/L (ref 39–117)
BUN/Creatinine Ratio: 13 (ref 10–24)
BUN: 13 mg/dL (ref 8–27)
Bilirubin Total: 0.5 mg/dL (ref 0.0–1.2)
CO2: 23 mmol/L (ref 20–29)
CREATININE: 0.97 mg/dL (ref 0.76–1.27)
Calcium: 10 mg/dL (ref 8.6–10.2)
Chloride: 100 mmol/L (ref 96–106)
GFR calc Af Amer: 94 mL/min/{1.73_m2} (ref >59)
GFR calc non Af Amer: 81 mL/min/{1.73_m2} (ref >59)
Globulin, Total: 2.7 g/dL (ref 1.5–4.5)
Glucose: 112 mg/dL — ABNORMAL HIGH (ref 65–99)
Potassium: 5.1 mmol/L (ref 3.5–5.2)
SODIUM: 140 mmol/L (ref 134–144)
Total Protein: 7.8 g/dL (ref 6.0–8.5)

## 2019-03-25 LAB — CBC WITH DIFFERENTIAL/PLATELET
Basophils Absolute: 0 10*3/uL (ref 0.0–0.2)
Basos: 1 %
EOS (ABSOLUTE): 0.2 10*3/uL (ref 0.0–0.4)
Eos: 3 %
HEMATOCRIT: 44.9 % (ref 37.5–51.0)
Hemoglobin: 15.3 g/dL (ref 13.0–17.7)
Immature Grans (Abs): 0 10*3/uL (ref 0.0–0.1)
Immature Granulocytes: 0 %
Lymphocytes Absolute: 1.7 10*3/uL (ref 0.7–3.1)
Lymphs: 28 %
MCH: 32.1 pg (ref 26.6–33.0)
MCHC: 34.1 g/dL (ref 31.5–35.7)
MCV: 94 fL (ref 79–97)
Monocytes Absolute: 0.6 10*3/uL (ref 0.1–0.9)
Monocytes: 10 %
Neutrophils Absolute: 3.7 10*3/uL (ref 1.4–7.0)
Neutrophils: 58 %
Platelets: 277 10*3/uL (ref 150–450)
RBC: 4.76 x10E6/uL (ref 4.14–5.80)
RDW: 13.2 % (ref 11.6–15.4)
WBC: 6.3 10*3/uL (ref 3.4–10.8)

## 2019-03-25 LAB — PSA: PROSTATE SPECIFIC AG, SERUM: 1.1 ng/mL (ref 0.0–4.0)

## 2019-03-25 LAB — LIPID PANEL
Chol/HDL Ratio: 3.5 ratio (ref 0.0–5.0)
Cholesterol, Total: 156 mg/dL (ref 100–199)
HDL: 44 mg/dL (ref >39)
LDL Calculated: 94 mg/dL (ref 0–99)
Triglycerides: 88 mg/dL (ref 0–149)
VLDL Cholesterol Cal: 18 mg/dL (ref 5–40)

## 2019-03-25 LAB — HEMOGLOBIN A1C
Est. average glucose Bld gHb Est-mCnc: 134 mg/dL
Hgb A1c MFr Bld: 6.3 % — ABNORMAL HIGH (ref 4.8–5.6)

## 2019-03-25 LAB — TSH: TSH: 0.85 u[IU]/mL (ref 0.450–4.500)

## 2019-03-29 ENCOUNTER — Telehealth: Payer: Self-pay

## 2019-03-29 NOTE — Telephone Encounter (Signed)
Called to inform patient that ins called and said they denied his Gel due to [place] not sending clinical notes. I explained we did not do PA as PCP sent in Solution instead to replace Gel. He stated he thinks he has a deductible of $138 for the Gel and has tried solution with gel in past and both were covered. Also mentioned he may have given his supplement ins by accident. Instructed him to call pharmacy and see what it is and if there is deductible for either one then that will have to met regardless. Patient will call back if feels we need to do PA as he seemed reluctant to use solution and really argues that he needs Gel. Will call us if he decides against Solution.

## 2019-04-01 ENCOUNTER — Other Ambulatory Visit: Payer: Self-pay | Admitting: Internal Medicine

## 2019-04-01 DIAGNOSIS — E785 Hyperlipidemia, unspecified: Principal | ICD-10-CM

## 2019-04-01 DIAGNOSIS — E1169 Type 2 diabetes mellitus with other specified complication: Secondary | ICD-10-CM

## 2019-05-26 DIAGNOSIS — H6123 Impacted cerumen, bilateral: Secondary | ICD-10-CM | POA: Diagnosis not present

## 2019-05-26 DIAGNOSIS — J301 Allergic rhinitis due to pollen: Secondary | ICD-10-CM | POA: Diagnosis not present

## 2019-05-26 DIAGNOSIS — H903 Sensorineural hearing loss, bilateral: Secondary | ICD-10-CM | POA: Diagnosis not present

## 2019-06-14 DIAGNOSIS — H903 Sensorineural hearing loss, bilateral: Secondary | ICD-10-CM | POA: Diagnosis not present

## 2019-06-29 ENCOUNTER — Telehealth: Payer: Self-pay

## 2019-06-29 NOTE — Telephone Encounter (Signed)
Completed PA on patient medication for Atorvastatin 10 mg once daily. 30 for 30 days. [email or web address]   Ranelle Oyster (Key: [id number])  "[employer] is reviewing your PA request. Typically an electronic fax response will be received within 72 hours. To check for an update later, open this request from your dashboard."  Awaiting Response.

## 2019-06-30 NOTE — Telephone Encounter (Signed)
Received fax note saying PA is not required. This medication is on member's plan list for covered drugs.

## 2019-08-04 ENCOUNTER — Other Ambulatory Visit: Payer: Self-pay | Admitting: Internal Medicine

## 2019-08-04 DIAGNOSIS — L409 Psoriasis, unspecified: Secondary | ICD-10-CM

## 2019-08-13 ENCOUNTER — Ambulatory Visit (INDEPENDENT_AMBULATORY_CARE_PROVIDER_SITE_OTHER): Payer: BC Managed Care – PPO | Admitting: Internal Medicine

## 2019-08-13 ENCOUNTER — Encounter: Payer: Self-pay | Admitting: Internal Medicine

## 2019-08-13 ENCOUNTER — Other Ambulatory Visit: Payer: Self-pay

## 2019-08-13 VITALS — BP 126/78 | HR 87 | Ht 76.0 in | Wt 318.0 lb

## 2019-08-13 DIAGNOSIS — E118 Type 2 diabetes mellitus with unspecified complications: Secondary | ICD-10-CM | POA: Diagnosis not present

## 2019-08-13 DIAGNOSIS — I10 Essential (primary) hypertension: Secondary | ICD-10-CM

## 2019-08-13 DIAGNOSIS — E1169 Type 2 diabetes mellitus with other specified complication: Secondary | ICD-10-CM | POA: Diagnosis not present

## 2019-08-13 DIAGNOSIS — E785 Hyperlipidemia, unspecified: Secondary | ICD-10-CM | POA: Diagnosis not present

## 2019-08-13 NOTE — Patient Instructions (Addendum)
Schedule DM eye exam - [facility] in [place] -

## 2019-08-13 NOTE — Progress Notes (Signed)
Date:  08/13/2019   Name:  Benjamin MillinKenneth A Mojica   DOB:  1951/11/07   MRN:  [id number]   Chief Complaint: Diabetes (Following up.), Hypertension, and Fatigue (Feels overly fatigued and no energy to do things for the last 6 months. Used to have a lot of energy. )  Diabetes He presents for his follow-up diabetic visit. He has type 2 diabetes mellitus. Pertinent negatives for hypoglycemia include no headaches, nervousness/anxiousness or tremors. Associated symptoms include fatigue (decreased motivation to get chores done). Pertinent negatives for diabetes include no chest pain, no foot paresthesias, no foot ulcerations, no polydipsia, no polyuria, no visual change and no weight loss. Symptoms are stable. Current diabetic treatment includes diet. He is compliant with treatment most of the time. His weight is increasing steadily. An ACE inhibitor/angiotensin II receptor blocker is being taken. Eye exam is not current.  Hypertension This is a chronic problem. The problem is controlled. Pertinent negatives include no chest pain, headaches, palpitations or shortness of breath. Past treatments include ACE inhibitors and calcium channel blockers.  Hyperlipidemia This is a chronic problem. The problem is controlled. Exacerbating diseases include diabetes. Factors aggravating his hyperlipidemia include fatty foods. Pertinent negatives include no chest pain or shortness of breath. Current antihyperlipidemic treatment includes statins. The current treatment provides significant improvement of lipids. Compliance problems include adherence to diet.    Lab Results  Component Value Date   HGBA1C 6.3 (H) 03/24/2019   Lab Results  Component Value Date   CHOL 156 03/24/2019   HDL 44 03/24/2019   LDLCALC 94 03/24/2019   TRIG 88 03/24/2019   CHOLHDL 3.5 03/24/2019   Lab Results  Component Value Date   CREATININE 0.97 03/24/2019   BUN 13 03/24/2019   NA 140 03/24/2019   K 5.1 03/24/2019   CL 100 03/24/2019   CO2 23 03/24/2019     Review of Systems  Constitutional: Positive for fatigue (decreased motivation to get chores done). Negative for appetite change, unexpected weight change and weight loss.  Eyes: Negative for visual disturbance.  Respiratory: Negative for cough, shortness of breath and wheezing.   Cardiovascular: Negative for chest pain, palpitations and leg swelling.  Gastrointestinal: Negative for abdominal pain and blood in stool.  Endocrine: Negative for polydipsia and polyuria.  Genitourinary: Negative for dysuria and hematuria.  Skin: Negative for color change and rash.  Neurological: Negative for tremors, numbness and headaches.  Psychiatric/Behavioral: Negative for dysphoric mood and sleep disturbance. The patient is not nervous/anxious.     Patient Active Problem List   Diagnosis Date Noted  . Severe obesity (HCC) 03/24/2019  . Hyperlipidemia associated with type 2 diabetes mellitus (HCC) 03/24/2019  . DM type 2, controlled, with complication (HCC) 06/12/2018  . Essential hypertension 06/02/2018  . Complex tear of medial meniscus of right knee as current injury 11/06/2016  . Primary osteoarthritis of right knee 11/06/2016  . Auditory impairment 11/04/2015  . 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 Generalized psoriasis 11/04/2015  . Periodic limb movement 11/04/2015  . Hyperlipidemia 11/04/2015    No Known Allergies  Past Surgical History:  Procedure Laterality Date  . BLEPHAROPLASTY  2016  . COLONOSCOPY WITH PROPOFOL N/A 05/01/2016   Procedure: COLONOSCOPY WITH PROPOFOL;  Surgeon: Earline MayotteJeffrey W Byrnett, MD;  [facility];  Service: Endoscopy;  Laterality: N/A;  . KNEE ARTHROSCOPY Left 01/14/2019   Procedure: ARTHROSCOPY KNEE WITH DEBRIDEMENT AND PARTIAL MEDIAL MENISCECTOMY;  Surgeon: Leron CroakPoggi, John  J, MD;  Location: [facility] ORS;  Service: Orthopedics;  Laterality: Left;  . KNEE ARTHROSCOPY  WITH MEDIAL MENISECTOMY Right 11/27/2016   Procedure: KNEE ARTHROSCOPY WITH MEDIAL MENISECTOMY;  Surgeon: Corky Mull, MD;  Location: [facility];  Service: Orthopedics;  Laterality: Right;  . SINUS SURGERY WITH INSTATRAK    . VASECTOMY      Social History   Tobacco Use  . Smoking status: Former Smoker    Quit date: 12/30/1985    Years since quitting: 33.6  . Smokeless tobacco: Never Used  Substance Use Topics  . Alcohol use: Yes    Alcohol/week: 4.0 standard drinks    Types: 4 Standard drinks or equivalent per week    Comment:    . Drug use: No     Medication list has been reviewed and updated.  Current Meds  Medication Sig  . amLODipine (NORVASC) 5 MG tablet Take 1 tablet (5 mg total) by mouth daily.  Marland Kitchen atorvastatin (LIPITOR) 10 MG tablet TAKE 1 TABLET BY MOUTH EVERY DAY  . clobetasol (TEMOVATE) 0.05 % external solution Apply 1 application topically 2 (two) times daily.  . clobetasol (TEMOVATE) 0.05 % GEL APPLY TO AFFECTED AREA TWICE A DAY  . HYDROcodone-acetaminophen (NORCO/VICODIN) 5-325 MG tablet Take 1-2 tablets by mouth every 6 (six) hours as needed for moderate pain.  Marland Kitchen lisinopril (PRINIVIL,ZESTRIL) 10 MG tablet Take 1 tablet (10 mg total) by mouth daily.  . meloxicam (MOBIC) 7.5 MG tablet Take 1 tablet (7.5 mg total) by mouth daily.    PHQ 2/9 Scores 08/13/2019 03/24/2019 10/15/2018 06/02/2018  PHQ - 2 Score 0 0 0 1  PHQ- 9 Score 6 - - -    BP Readings from Last 3 Encounters:  08/13/19 126/78  03/24/19 112/78  01/14/19 (!) 146/77    Physical Exam Vitals signs and nursing note reviewed.  Constitutional:      General: He is not in acute distress.    Appearance: Normal appearance. He is well-developed.  HENT:     Head: Normocephalic and atraumatic.  Neck:     Musculoskeletal: Normal range of motion.     Vascular: No carotid bruit.  Cardiovascular:     Rate and Rhythm: Normal rate and regular rhythm.     Pulses: Normal pulses.     Heart sounds: No  murmur.  Pulmonary:     Effort: Pulmonary effort is normal. No respiratory distress.     Breath sounds: No wheezing or rhonchi.  Musculoskeletal: Normal range of motion.     Right lower leg: No edema.     Left lower leg: No edema.  Lymphadenopathy:     Cervical: No cervical adenopathy.  Skin:    General: Skin is warm and dry.     Capillary Refill: Capillary refill takes less than 2 seconds.     Findings: No rash.  Neurological:     General: No focal deficit present.     Mental Status: He is alert and oriented to person, place, and time.  Psychiatric:        Behavior: Behavior normal.        Thought Content: Thought content normal.     Wt Readings from Last 3 Encounters:  08/13/19 (!) 318 lb (144.2 kg)  03/24/19 (!) 310 lb (140.6 kg)  01/14/19 (!) 306 lb 7 oz (139 kg)    BP 126/78   Pulse 87   Ht 6\' 4"  (1.93 m)   Wt (!) 318 lb (144.2 kg)  SpO2 97%   BMI 38.71 kg/m   Assessment and Plan: 1. Essential hypertension Clinically stable with no sx - no SOB, PND or orthopnea Mild fatigue not likely a manifestation of CAD - Basic metabolic panel  2. DM type 2, controlled, with complication (HCC) On diet alone but with recent weight gain and inattention to diet, A1C will likely be higher Discussed need to lose weight and keep A1C < 7.  May need to start oral medications if worsening Pt reminded to schedule DM eye exam - Hemoglobin A1c  3. Hyperlipidemia associated with type 2 diabetes mellitus (HCC) On statin therapy - no concerns or side effects to treatment Not adhering to low fat diet LDL not quite at goal - recommend working on low fat diet before changing or increasing medication   Partially dictated using Animal nutritionistDragon software. Any errors are unintentional.  Bari EdwardLaura Darris Staiger, MD [facility]  08/13/2019

## 2019-08-14 LAB — BASIC METABOLIC PANEL
BUN/Creatinine Ratio: 11 (ref 10–24)
BUN: 12 mg/dL (ref 8–27)
CO2: 24 mmol/L (ref 20–29)
Calcium: 10 mg/dL (ref 8.6–10.2)
Chloride: 100 mmol/L (ref 96–106)
Creatinine, Ser: 1.07 mg/dL (ref 0.76–1.27)
GFR calc Af Amer: 83 mL/min/{1.73_m2} (ref >59)
GFR calc non Af Amer: 72 mL/min/{1.73_m2} (ref >59)
Glucose: 103 mg/dL — ABNORMAL HIGH (ref 65–99)
Potassium: 4.4 mmol/L (ref 3.5–5.2)
Sodium: 140 mmol/L (ref 134–144)

## 2019-08-14 LAB — HEMOGLOBIN A1C
Est. average glucose Bld gHb Est-mCnc: 148 mg/dL
Hgb A1c MFr Bld: 6.8 % — ABNORMAL HIGH (ref 4.8–5.6)

## 2019-10-05 DIAGNOSIS — H5203 Hypermetropia, bilateral: Secondary | ICD-10-CM | POA: Diagnosis not present

## 2019-10-05 LAB — HM DIABETES EYE EXAM

## 2019-11-04 ENCOUNTER — Telehealth: Payer: Self-pay | Admitting: Internal Medicine

## 2019-11-04 NOTE — Telephone Encounter (Signed)
Referral for dermatology.

## 2019-11-05 NOTE — Telephone Encounter (Signed)
Patient needs to schedule an appt to address his problem before we can place a referral to dermatology. The specialist prefers [place] to see the patient before referring them. Please schedule patient for the problem or symptoms he is having. Thank you.

## 2019-11-08 ENCOUNTER — Ambulatory Visit (INDEPENDENT_AMBULATORY_CARE_PROVIDER_SITE_OTHER): Payer: BC Managed Care – PPO | Admitting: Internal Medicine

## 2019-11-08 ENCOUNTER — Encounter: Payer: Self-pay | Admitting: Internal Medicine

## 2019-11-08 ENCOUNTER — Other Ambulatory Visit: Payer: Self-pay

## 2019-11-08 VITALS — BP 132/80 | HR 86 | Ht 76.0 in | Wt 321.0 lb

## 2019-11-08 DIAGNOSIS — I10 Essential (primary) hypertension: Secondary | ICD-10-CM | POA: Diagnosis not present

## 2019-11-08 DIAGNOSIS — E785 Hyperlipidemia, unspecified: Secondary | ICD-10-CM | POA: Diagnosis not present

## 2019-11-08 DIAGNOSIS — E1169 Type 2 diabetes mellitus with other specified complication: Secondary | ICD-10-CM | POA: Diagnosis not present

## 2019-11-08 DIAGNOSIS — L409 Psoriasis, unspecified: Secondary | ICD-10-CM | POA: Diagnosis not present

## 2019-11-08 MED ORDER — CLOBETASOL PROPIONATE 0.05 % EX GEL: 1.0000 "application " | Freq: Two times a day (BID) | CUTANEOUS | 1 refills | Status: DC

## 2019-11-08 MED ORDER — MELOXICAM 7.5 MG PO TABS: 7.5000 mg | ORAL_TABLET | Freq: Every day | ORAL | 1 refills | Status: DC

## 2019-11-08 MED ORDER — ATORVASTATIN CALCIUM 10 MG PO TABS: 10.0000 mg | ORAL_TABLET | Freq: Every day | ORAL | 1 refills | Status: DC

## 2019-11-08 MED ORDER — CLOBETASOL PROPIONATE 0.05 % EX SOLN: 1.0000 "application " | Freq: Two times a day (BID) | CUTANEOUS | 2 refills | Status: DC

## 2019-11-08 NOTE — Progress Notes (Signed)
Date:  11/08/2019   Name:  SHAHIR KAREN   DOB:  21-May-1951   MRN:  [id number]   Chief Complaint: Hypertension (6 month follow up. ), Diabetes, and Psoriasis (Wants to see dermatologist. )  Rash This is a chronic problem. The problem has been waxing and waning since onset. The affected locations include the abdomen, left axilla, right axilla and torso. The rash is characterized by itchiness, redness and scaling. Pertinent negatives include no cough, fatigue or shortness of breath. (Long standing psoriasis)  Diabetes He presents for his follow-up diabetic visit. He has type 2 diabetes mellitus. His disease course has been stable. Pertinent negatives for hypoglycemia include no headaches or tremors. Pertinent negatives for diabetes include no chest pain and no fatigue. Current diabetic treatment includes diet. He is compliant with treatment most of the time.  Hypertension Pertinent negatives include no chest pain, headaches, palpitations or shortness of breath.    @ Lab Results  Component Value Date   CREATININE 1.07 08/13/2019   BUN 12 08/13/2019   NA 140 08/13/2019   K 4.4 08/13/2019   CL 100 08/13/2019   CO2 24 08/13/2019   @ Lab Results  Component Value Date   CHOL 156 03/24/2019   HDL 44 03/24/2019   LDLCALC 94 03/24/2019   TRIG 88 03/24/2019   CHOLHDL 3.5 03/24/2019   @ Lab Results  Component Value Date   TSH 0.850 03/24/2019   @ Lab Results  Component Value Date   HGBA1C 6.8 (H) 08/13/2019     Review of Systems  Constitutional: Negative for appetite change, fatigue and unexpected weight change.  Eyes: Negative for visual disturbance.  Respiratory: Negative for cough, shortness of breath and wheezing.   Cardiovascular: Negative for chest pain, palpitations and leg swelling.  Gastrointestinal: Negative for abdominal pain and blood in stool.  Genitourinary: Negative for dysuria and hematuria.  Skin: Positive for rash. Negative for color change.   Neurological: Negative for tremors, numbness and headaches.  Psychiatric/Behavioral: Negative for dysphoric mood.    Patient Active Problem List   Diagnosis Date Noted  . Severe obesity (HCC) 03/24/2019  . Hyperlipidemia associated with type 2 diabetes mellitus (HCC) 03/24/2019  . DM type 2, controlled, with complication (HCC) 06/12/2018  . Essential hypertension 06/02/2018  . Complex tear of medial meniscus of right knee as current injury 11/06/2016  . Primary osteoarthritis of right knee 11/06/2016  . Auditory impairment 11/04/2015  . 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 Generalized psoriasis 11/04/2015  . Periodic limb movement 11/04/2015    No Known Allergies  Past Surgical History:  Procedure Laterality Date  . BLEPHAROPLASTY  2016  . COLONOSCOPY WITH PROPOFOL N/A 05/01/2016   Procedure: COLONOSCOPY WITH PROPOFOL;  Surgeon: Earline Mayotte, MD;  Location: [facility] ENDOSCOPY;  Service: Endoscopy;  Laterality: N/A;  . KNEE ARTHROSCOPY Left 01/14/2019   Procedure: ARTHROSCOPY KNEE WITH DEBRIDEMENT AND PARTIAL MEDIAL MENISCECTOMY;  Surgeon: Christena Flake, MD;  Location: [facility] ORS;  Service: Orthopedics;  Laterality: Left;  . KNEE ARTHROSCOPY WITH MEDIAL MENISECTOMY Right 11/27/2016   Procedure: KNEE ARTHROSCOPY WITH MEDIAL MENISECTOMY;  Surgeon: Christena Flake, MD;  Location: [facility];  Service: Orthopedics;  Laterality: Right;  . SINUS SURGERY WITH INSTATRAK    . VASECTOMY      Social History   Tobacco Use  . Smoking status: Former Smoker    Quit date: 12/30/1985    Years since  quitting: 33.8  . Smokeless tobacco: Never Used  Substance Use Topics  . Alcohol use: Yes    Alcohol/week: 4.0 standard drinks    Types: 4 Standard drinks or equivalent per week    Comment:    . Drug use: No     Medication list has been reviewed and updated.  Current Meds  Medication Sig  . amLODipine (NORVASC) 5  MG tablet Take 1 tablet (5 mg total) by mouth daily.  Marland Kitchen atorvastatin (LIPITOR) 10 MG tablet TAKE 1 TABLET BY MOUTH EVERY DAY  . clobetasol (TEMOVATE) 0.05 % external solution Apply 1 application topically 2 (two) times daily.  Marland Kitchen lisinopril (PRINIVIL,ZESTRIL) 10 MG tablet Take 1 tablet (10 mg total) by mouth daily.  . meloxicam (MOBIC) 7.5 MG tablet Take 1 tablet (7.5 mg total) by mouth daily.    PHQ 2/9 Scores 11/08/2019 08/13/2019 03/24/2019 10/15/2018  PHQ - 2 Score 0 0 0 0  PHQ- 9 Score - 6 - -    BP Readings from Last 3 Encounters:  11/08/19 132/80  08/13/19 126/78  03/24/19 112/78    Physical Exam Vitals signs and nursing note reviewed.  Constitutional:      General: He is not in acute distress.    Appearance: He is well-developed.  HENT:     Head: Normocephalic and atraumatic.  Neck:     Musculoskeletal: Normal range of motion.  Cardiovascular:     Rate and Rhythm: Normal rate and regular rhythm.     Pulses: Normal pulses.  Pulmonary:     Effort: Pulmonary effort is normal. No respiratory distress.  Musculoskeletal: Normal range of motion.     Right lower leg: No edema.     Left lower leg: No edema.  Skin:    General: Skin is warm and dry.     Findings: Rash present.     Comments: Scattered erythematous plaques with silvery scale on trunk and axilla  Neurological:     Mental Status: He is alert and oriented to person, place, and time.  Psychiatric:        Behavior: Behavior normal.        Thought Content: Thought content normal.     Wt Readings from Last 3 Encounters:  11/08/19 (!) 321 lb (145.6 kg)  08/13/19 (!) 318 lb (144.2 kg)  03/24/19 (!) 310 lb (140.6 kg)    BP 132/80   Pulse 86   Ht 6\' 4"  (1.93 m)   Wt (!) 321 lb (145.6 kg)   SpO2 96%   BMI 39.07 kg/m   Assessment and Plan: 1. Psoriasis Continue temovate Refer to Dermatology - Ambulatory referral to Dermatology - clobetasol (TEMOVATE) 0.05 % GEL; Apply 1 application topically 2 (two) times  daily. To psoriasis lesions on trunk  Dispense: 60 g; Refill: 1  2. Essential hypertension Clinically stable exam with well controlled BP.   Tolerating medications, lisinopril 10 mg and amlodipine 5 mg, without side effects at this time. Pt to continue current regimen and low sodium diet; benefits of regular exercise as able discussed.  3. Hyperlipidemia associated with type 2 diabetes mellitus ([place]) controlled - atorvastatin (LIPITOR) 10 MG tablet; Take 1 tablet (10 mg total) by mouth daily.  Dispense: 90 tablet; Refill: 1   Partially dictated using Editor, commissioning. Any errors are unintentional.  Halina Maidens, MD [facility]  11/08/2019

## 2019-11-09 NOTE — Telephone Encounter (Signed)
Informed patient

## 2019-11-10 DIAGNOSIS — L400 Psoriasis vulgaris: Secondary | ICD-10-CM | POA: Diagnosis not present

## 2019-11-10 DIAGNOSIS — Z79899 Other long term (current) drug therapy: Secondary | ICD-10-CM | POA: Diagnosis not present

## 2019-11-10 LAB — HM HEPATITIS C SCREENING LAB: HM Hepatitis Screen: POSITIVE

## 2019-11-10 LAB — CBC AND DIFFERENTIAL
HCT: 45 (ref 41–53)
Hemoglobin: 15.5 (ref 13.5–17.5)
Neutrophils Absolute: 61
Platelets: 272 (ref 150–399)
WBC: 9.6

## 2019-11-10 LAB — HEPATIC FUNCTION PANEL
ALT: 69 — AB (ref 10–40)
AST: 35 (ref 14–40)
Alkaline Phosphatase: 86 (ref 25–125)
Bilirubin, Direct: 0.12 (ref 0.01–0.4)
Bilirubin, Total: 0.3

## 2019-11-10 LAB — CBC: RBC: 4.8 (ref 3.87–5.11)

## 2019-11-10 LAB — COMPREHENSIVE METABOLIC PANEL: Albumin: 4.8 (ref 3.5–5.0)

## 2019-11-17 ENCOUNTER — Telehealth: Payer: Self-pay | Admitting: Internal Medicine

## 2019-11-17 NOTE — Telephone Encounter (Signed)
We sent the patient a years worth in March. Called the pharmacy and he has not called them to refill his next fill. I told them to get his next refill ready and it will be ready in the next 2 hours.  Please inform pt. Thank you.

## 2019-11-17 NOTE — Telephone Encounter (Signed)
Patient said that the [facility] in [place] did not fill his meds for amlodipine and lisinopril.

## 2019-11-22 ENCOUNTER — Encounter: Payer: Self-pay | Admitting: Internal Medicine

## 2019-12-01 ENCOUNTER — Telehealth: Payer: Self-pay | Admitting: Internal Medicine

## 2019-12-01 ENCOUNTER — Other Ambulatory Visit: Payer: Self-pay | Admitting: Internal Medicine

## 2019-12-01 DIAGNOSIS — B182 Chronic viral hepatitis C: Secondary | ICD-10-CM

## 2019-12-01 NOTE — Telephone Encounter (Signed)
Called to discuss positive HCV test done by Dermatology prior to starting monoclonal Ab therapy for psoriasis.  Questions answered, pt does not have any specific risk factors for HCV infection. Will refer to GI to evaluate and treat.  Pt agrees.  He will call back if he has any further questions.

## 2020-01-09 NOTE — Progress Notes (Signed)
Gastroenterology Consultation  Referring Provider:     Glean Hess, MD Primary Care Physician:  Glean Hess, MD Primary Gastroenterologist:  Dr. Allen Norris     Reason for Consultation:     Hepatitis C antibody positive        HPI:   Benjamin Medina is a 69 y.o. y/o male referred for consultation & management of hepatitis C antibody positive by Dr. Army Melia, Jesse Sans, MD.  This patient comes to me today after being seen in the past by Dr. Bary Castilla for colonoscopies with his last colonoscopy being in 2017.  The patient was now found to have a positive hepatitis C antibody with a viral load of 12 million IU/ml.  The patient was also found to have a hepatitis B surface antibody positive.  Lab work from November 2020 showed the patient to have a elevated ALT at 69 with all of his other labs being normal. 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  The patient states that he has never been told that he hepatitis C prior to the recent blood work.  There is no report of any nausea vomiting fevers chills black stools or bloody stools.  The patient comes with his wife today and she states she has not been tested.  They do report that this is his second marriage and he has 1 child who is an adult.  Past Medical History:  Diagnosis Date  . Arthritis   . Auditory impairment   . BPH with urinary obstruction   . Contracture of hand    PALMAR FASCIA  . Diabetes mellitus without complication ([place])   . Hypertension   . Psoriasis     Past Surgical History:  Procedure Laterality Date  . BLEPHAROPLASTY  2016  . COLONOSCOPY WITH PROPOFOL N/A 05/01/2016   Procedure: COLONOSCOPY WITH PROPOFOL;  Surgeon: Robert Bellow, MD;  Location: [facility] ENDOSCOPY;  Service: Endoscopy;  Laterality: N/A;  . KNEE ARTHROSCOPY Left 01/14/2019   Procedure: ARTHROSCOPY KNEE WITH DEBRIDEMENT AND PARTIAL MEDIAL MENISCECTOMY;  Surgeon: Corky Mull, MD;  Location: [facility] ORS;  Service: Orthopedics;  Laterality: Left;  . KNEE ARTHROSCOPY WITH MEDIAL MENISECTOMY Right 11/27/2016   Procedure: KNEE ARTHROSCOPY WITH MEDIAL MENISECTOMY;  Surgeon: Corky Mull, MD;  Location: [place];  Service: Orthopedics;  Laterality: Right;  . SINUS SURGERY WITH INSTATRAK    . VASECTOMY      Prior to Admission medications   Medication Sig Start Date End Date Taking? Authorizing Provider  amLODipine (NORVASC) 5 MG tablet Take 1 tablet (5 mg total) by mouth daily. 03/24/19   Glean Hess, MD  atorvastatin (LIPITOR) 10 MG tablet Take 1 tablet (10 mg total) by mouth daily. 11/08/19   Glean Hess, MD  clobetasol (TEMOVATE) 0.05 % external solution Apply 1 application topically 2 (two) times daily. 11/08/19   Glean Hess, MD  clobetasol (TEMOVATE) 0.05 % GEL Apply 1 application topically 2 (two) times daily. To psoriasis lesions on trunk 11/08/19   Glean Hess, MD  lisinopril (PRINIVIL,ZESTRIL) 10 MG tablet Take 1 tablet (10 mg total) by mouth daily. 03/24/19   Glean Hess, MD  meloxicam (MOBIC) 7.5 MG tablet Take 1 tablet (7.5 mg total) by mouth daily. 11/08/19   Glean Hess, MD    Family History  Problem Relation Age of Onset  . Colon cancer Mother 89  .  Lymphoma Father      Social History   Tobacco Use  . Smoking status: Former Smoker    Quit date: 12/30/1985    Years since quitting: 34.0  . Smokeless tobacco: Never Used  Substance Use Topics  . Alcohol use: Yes    Alcohol/week: 4.0 standard drinks    Types: 4 Standard drinks or equivalent per week    Comment:    . Drug use: No    Allergies as of 01/10/2020  . (No Known Allergies)    Review of Systems:    All systems reviewed and negative except where noted in HPI.   Physical Exam:  There were no vitals taken for this visit. No LMP for male patient. General:   Alert,  Well-developed, well-nourished, pleasant and cooperative in NAD Head:   Normocephalic and atraumatic. Eyes:  Sclera clear, no icterus.   Conjunctiva pink. Ears:  Normal auditory acuity. Neck:  Supple; no masses or thyromegaly. Lungs:  Respirations even and unlabored.  Clear throughout to auscultation.   No wheezes, crackles, or rhonchi. No acute distress. Heart:  Regular rate and rhythm; no murmurs, clicks, rubs, or gallops. Abdomen:  Normal bowel sounds.  No bruits.  Soft, non-tender and non-distended without masses, hepatosplenomegaly or hernias noted.  No guarding or rebound tenderness.  Negative Carnett sign.   Rectal:  Deferred.  Pulses:  Normal pulses noted. Extremities:  No clubbing or edema.  No cyanosis. Neurologic:  Alert and oriented x3;  grossly normal neurologically. Skin:  Intact without significant lesions or rashes.  No jaundice. Lymph Nodes:  No significant cervical adenopathy. Psych:  Alert and cooperative. Normal mood and affect.  Imaging Studies: No results found.  Assessment and Plan:   Benjamin Medina is a 69 y.o. y/o male who comes in today with a history of hepatitis C antibody being positive with a viral load of 12,000,000 international units/mL.  The patient will have his lab sent off for other possible causes of abnormal liver enzymes.  He will also have his genotype checked and be evaluated for fibrosis.  He is immune to hepatitis B but his immunity to hepatitis A will be checked and will be set up for vaccination if needed.  The patient has been explained the plan and agrees with it.    Midge Minium, MD. [facility]    Note: This dictation was prepared with Dragon dictation along with smaller phrase technology. Any transcriptional errors that result from this process are unintentional.

## 2020-01-10 ENCOUNTER — Encounter: Payer: Self-pay | Admitting: Gastroenterology

## 2020-01-10 ENCOUNTER — Encounter: Payer: Self-pay | Admitting: Internal Medicine

## 2020-01-10 ENCOUNTER — Other Ambulatory Visit: Payer: Self-pay

## 2020-01-10 ENCOUNTER — Ambulatory Visit (INDEPENDENT_AMBULATORY_CARE_PROVIDER_SITE_OTHER): Payer: BC Managed Care – PPO | Admitting: Gastroenterology

## 2020-01-10 VITALS — BP 131/80 | HR 76 | Temp 98.3°F | Ht 76.0 in | Wt 323.0 lb

## 2020-01-10 DIAGNOSIS — Z8619 Personal history of other infectious and parasitic diseases: Secondary | ICD-10-CM | POA: Insufficient documentation

## 2020-01-10 DIAGNOSIS — B182 Chronic viral hepatitis C: Secondary | ICD-10-CM | POA: Diagnosis not present

## 2020-01-12 ENCOUNTER — Ambulatory Visit: Payer: BLUE CROSS/BLUE SHIELD | Admitting: Gastroenterology

## 2020-01-13 LAB — ALPHA-1-ANTITRYPSIN: A-1 Antitrypsin: 57 mg/dL — ABNORMAL LOW (ref 101–187)

## 2020-01-13 LAB — HEPATIC FUNCTION PANEL
ALT: 80 IU/L — ABNORMAL HIGH (ref 0–44)
AST: 42 IU/L — ABNORMAL HIGH (ref 0–40)
Albumin: 4.7 g/dL (ref 3.8–4.8)
Alkaline Phosphatase: 74 IU/L (ref 39–117)
Bilirubin Total: 0.4 mg/dL (ref 0.0–1.2)
Bilirubin, Direct: 0.13 mg/dL (ref 0.00–0.40)
Total Protein: 7.4 g/dL (ref 6.0–8.5)

## 2020-01-13 LAB — HCV FIBROSURE
ALPHA 2-MACROGLOBULINS, QN: 321 mg/dL — ABNORMAL HIGH (ref 110–276)
ALT (SGPT) P5P: 98 IU/L — ABNORMAL HIGH (ref 0–55)
Apolipoprotein A-1: 122 mg/dL (ref 101–178)
Bilirubin, Total: 0.4 mg/dL (ref 0.0–1.2)
Fibrosis Score: 0.59 — ABNORMAL HIGH (ref 0.00–0.21)
GGT: 60 IU/L (ref 0–65)
Haptoglobin: 188 mg/dL (ref 32–363)
Necroinflammat Activity Score: 0.66 — ABNORMAL HIGH (ref 0.00–0.17)

## 2020-01-13 LAB — ANTI-SMOOTH MUSCLE ANTIBODY, IGG: Smooth Muscle Ab: 9 Units (ref 0–19)

## 2020-01-13 LAB — HCV RNA QUANT

## 2020-01-13 LAB — CERULOPLASMIN: Ceruloplasmin: 29 mg/dL (ref 16.0–31.0)

## 2020-01-13 LAB — HEPATITIS C GENOTYPE

## 2020-01-13 LAB — HCV RNA (INTERNATIONAL UNITS)
HCV RNA (International Units): 17900000 IU/mL
HCV log10: 7.253 log10 IU/mL

## 2020-01-13 LAB — HEPATITIS A ANTIBODY, TOTAL: hep A Total Ab: NEGATIVE

## 2020-01-13 LAB — MITOCHONDRIAL ANTIBODIES: Mitochondrial Ab: <20 Units (ref 0.0–20.0)

## 2020-01-13 LAB — ANA: Anti Nuclear Antibody (ANA): NEGATIVE

## 2020-01-14 ENCOUNTER — Telehealth: Payer: Self-pay

## 2020-01-14 NOTE — Telephone Encounter (Signed)
Pt notified of results. Paperwork for Hep C treatment has been completed and faxed to [employer].

## 2020-01-14 NOTE — Telephone Encounter (Signed)
-----   Message from Midge Minium, MD sent at 01/12/2020  6:23 PM EST ----- Let the patient know that his labs showed significant liver damage and hepatitis C and he should be treated as soon as possible for the hepatitis C.

## 2020-01-18 ENCOUNTER — Telehealth: Payer: Self-pay | Admitting: Gastroenterology

## 2020-01-18 NOTE — Telephone Encounter (Signed)
Discuss the lab results with pt.

## 2020-01-18 NOTE — Telephone Encounter (Signed)
Patient called in asking for Ginger. He has ask that she return his call to go over lab  results in [facility] Chart.

## 2020-02-02 ENCOUNTER — Telehealth: Payer: Self-pay | Admitting: Gastroenterology

## 2020-02-02 NOTE — Telephone Encounter (Signed)
[employer] left vm regarding seeing if we have received an Apeal letter for alumia rx after 11/12/19 for pt if so she like to fax [place] [phone]  Any questions cb [phone] ext [phone]

## 2020-02-02 NOTE — Telephone Encounter (Signed)
Left vm for a return call regarding an appeal letter we supposedly received.

## 2020-02-13 ENCOUNTER — Ambulatory Visit: Payer: Medicare Other | Attending: Internal Medicine

## 2020-02-13 DIAGNOSIS — Z23 Encounter for immunization: Secondary | ICD-10-CM | POA: Insufficient documentation

## 2020-02-13 NOTE — Progress Notes (Signed)
   Covid-19 Vaccination Clinic  Name:  CATCHER DEHOYOS    MRN: [id number] DOB: Dec 11, 1951  02/13/2020  Mr. Fridman was observed post Covid-19 immunization for 15 minutes without incidence. He was provided with Vaccine Information Sheet and instruction to access the V-Safe system.   Mr. Alvillar was instructed to call 911 with any severe reactions post vaccine: Marland Kitchen Difficulty breathing  . Swelling of your face and throat  . A fast heartbeat  . A bad rash all over your body  . Dizziness and weakness    Immunizations Administered    Name Date Dose VIS Date Route   Pfizer COVID-19 Vaccine 02/13/2020 10:14 AM 0.3 mL 12/10/2019 Intramuscular   Manufacturer: [employer], [employer]   Lot: [id number]   NDC: [address]-3

## 2020-03-07 ENCOUNTER — Ambulatory Visit: Payer: Medicare Other | Attending: Internal Medicine

## 2020-03-07 ENCOUNTER — Telehealth: Payer: Self-pay | Admitting: Gastroenterology

## 2020-03-07 DIAGNOSIS — Z23 Encounter for immunization: Secondary | ICD-10-CM

## 2020-03-07 NOTE — Telephone Encounter (Signed)
Spoke with pt regarding his the side effect of his Epclusia medication to treat his Hep C. Pt is only experiencing fatigue. Advised pt unfortunately there is nothing Dr. Servando Snare can prescribe for this side effect. Advised pt to rest when he can and to call me back if he has any further symptoms.

## 2020-03-07 NOTE — Telephone Encounter (Signed)
Patient called requesting to speak with Ginger.

## 2020-03-07 NOTE — Progress Notes (Signed)
   Covid-19 Vaccination Clinic  Name:  Benjamin Medina    MRN: [id number] DOB: 1951-02-16  03/07/2020  [name] was observed post Covid-19 immunization for 15 minutes without incident. He was provided with Vaccine Information Sheet and instruction to access the V-Safe system.   [name] was instructed to call 911 with any severe reactions post vaccine: Marland Kitchen Difficulty breathing  . Swelling of face and throat  . A fast heartbeat  . A bad rash all over body  . Dizziness and weakness   Immunizations Administered    Name Date Dose VIS Date Route   Pfizer COVID-19 Vaccine 03/07/2020  1:23 PM 0.3 mL 12/10/2019 Intramuscular   Manufacturer: [employer], [employer]   Lot: [id number]   NDC: [address]-1

## 2020-03-24 ENCOUNTER — Ambulatory Visit (INDEPENDENT_AMBULATORY_CARE_PROVIDER_SITE_OTHER): Payer: BC Managed Care – PPO | Admitting: Internal Medicine

## 2020-03-24 ENCOUNTER — Encounter: Payer: Self-pay | Admitting: Internal Medicine

## 2020-03-24 ENCOUNTER — Other Ambulatory Visit: Payer: Self-pay

## 2020-03-24 VITALS — BP 138/78 | HR 84 | Temp 98.0°F | Ht 76.0 in | Wt 319.0 lb

## 2020-03-24 DIAGNOSIS — Z125 Encounter for screening for malignant neoplasm of prostate: Secondary | ICD-10-CM | POA: Diagnosis not present

## 2020-03-24 DIAGNOSIS — B182 Chronic viral hepatitis C: Secondary | ICD-10-CM

## 2020-03-24 DIAGNOSIS — Z6838 Body mass index (BMI) 38.0-38.9, adult: Secondary | ICD-10-CM | POA: Diagnosis not present

## 2020-03-24 DIAGNOSIS — Z0000 Encounter for general adult medical examination without abnormal findings: Secondary | ICD-10-CM

## 2020-03-24 DIAGNOSIS — E1169 Type 2 diabetes mellitus with other specified complication: Secondary | ICD-10-CM | POA: Diagnosis not present

## 2020-03-24 DIAGNOSIS — I10 Essential (primary) hypertension: Secondary | ICD-10-CM

## 2020-03-24 DIAGNOSIS — E785 Hyperlipidemia, unspecified: Secondary | ICD-10-CM

## 2020-03-24 DIAGNOSIS — E118 Type 2 diabetes mellitus with unspecified complications: Secondary | ICD-10-CM | POA: Diagnosis not present

## 2020-03-24 LAB — POCT URINALYSIS DIPSTICK
Bilirubin, UA: NEGATIVE
Blood, UA: NEGATIVE
Glucose, UA: NEGATIVE
Ketones, UA: NEGATIVE
Leukocytes, UA: NEGATIVE
Nitrite, UA: NEGATIVE
Protein, UA: NEGATIVE
Spec Grav, UA: 1.015 (ref 1.010–1.025)
Urobilinogen, UA: 0.2 E.U./dL
pH, UA: 6 (ref 5.0–8.0)

## 2020-03-24 NOTE — Progress Notes (Signed)
Date:  03/24/2020   Name:  Benjamin Medina   DOB:  [birth year]   MRN:  [id number]   Chief Complaint: Annual Exam Benjamin Medina is a 69 y.o. male who presents today for his Complete Annual Exam. He feels fairly well. He reports exercising walking. He reports he is sleeping well. He retired last week.  Colonoscopy  2017 Immunization History  Administered Date(s) Administered  . Influenza,inj,Quad PF,6+ Mos 10/15/2018, 10/09/2019  . PFIZER SARS-COV-2 Vaccination 02/13/2020, 03/07/2020  . Pneumococcal Conjugate-13 10/15/2018  . Tdap 05/08/2011  . Zoster 05/08/2011    Hypertension This is a chronic problem. The problem is controlled. Pertinent negatives include no chest pain, headaches, palpitations or shortness of breath. Past treatments include calcium channel blockers and ACE inhibitors. The current treatment provides significant improvement. There are no compliance problems.  There is no history of kidney disease or CAD/MI.  Diabetes He presents for his follow-up diabetic visit. He has type 2 diabetes mellitus. His disease course has been stable. Pertinent negatives for hypoglycemia include no dizziness or headaches. Associated symptoms include fatigue. Pertinent negatives for diabetes include no chest pain. Current diabetic treatment includes diet. He is compliant with treatment most of the time. His weight is stable. An ACE inhibitor/angiotensin II receptor blocker is being taken. Eye exam is current.  Hyperlipidemia The problem is controlled. Pertinent negatives include no chest pain, myalgias or shortness of breath. Current antihyperlipidemic treatment includes statins. The current treatment provides significant improvement of lipids.  Hepatitis C - he has just started treatment Feb 1st for Hep C with Dr. Allen Norris.  He is tolerating it well with some fatigue only.     Lab Results  Component Value Date   CREATININE 1.07 08/13/2019   BUN 12 08/13/2019   NA 140 08/13/2019   K 4.4  08/13/2019   CL 100 08/13/2019   CO2 24 08/13/2019   Lab Results  Component Value Date   CHOL 156 03/24/2019   HDL 44 03/24/2019   LDLCALC 94 03/24/2019   TRIG 88 03/24/2019   CHOLHDL 3.5 03/24/2019   Lab Results  Component Value Date   TSH 0.850 03/24/2019   Lab Results  Component Value Date   HGBA1C 6.8 (H) 08/13/2019   Lab Results  Component Value Date   WBC 9.6 11/10/2019   HGB 15.5 11/10/2019   HCT 45 11/10/2019   MCV 94 03/24/2019   PLT 272 11/10/2019   Lab Results  Component Value Date   ALT 80 (H) 01/10/2020   AST 42 (H) 01/10/2020   ALKPHOS 74 01/10/2020   BILITOT 0.4 01/10/2020     Review of Systems  Constitutional: Positive for fatigue. Negative for appetite change, chills, diaphoresis and unexpected weight change.  HENT: Negative for hearing loss, tinnitus, trouble swallowing and voice change.   Eyes: Negative for visual disturbance.  Respiratory: Negative for choking, shortness of breath and wheezing.   Cardiovascular: Negative for chest pain, palpitations and leg swelling.  Gastrointestinal: Negative for abdominal pain, blood in stool, constipation and diarrhea.  Genitourinary: Negative for difficulty urinating, dysuria, frequency and hematuria.  Musculoskeletal: Negative for arthralgias, back pain and myalgias.  Skin: Positive for rash (psoriasis). Negative for color change.  Neurological: Negative for dizziness, syncope and headaches.  Hematological: Negative for adenopathy.  Psychiatric/Behavioral: Negative for dysphoric mood and sleep disturbance.    Patient Active Problem List   Diagnosis Date Noted  . Chronic hepatitis C ([place]) 01/10/2020  . Severe obesity ([place]) 03/24/2019  .  Hyperlipidemia associated with type 2 diabetes mellitus (HCC) 03/24/2019  . DM type 2, controlled, with complication (HCC) 06/12/2018  . Essential hypertension 06/02/2018  . Complex tear of medial meniscus of right knee as current injury 11/06/2016  . Primary  osteoarthritis of right knee 11/06/2016  . Auditory impairment 11/04/2015  . 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 Generalized psoriasis 11/04/2015  . Periodic limb movement 11/04/2015    No Known Allergies  Past Surgical History:  Procedure Laterality Date  . BLEPHAROPLASTY  2016  . COLONOSCOPY WITH PROPOFOL N/A 05/01/2016   Procedure: COLONOSCOPY WITH PROPOFOL;  Surgeon: Earline Mayotte, MD;  Location: [facility] ENDOSCOPY;  Service: Endoscopy;  Laterality: N/A;  . KNEE ARTHROSCOPY Left 01/14/2019   Procedure: ARTHROSCOPY KNEE WITH DEBRIDEMENT AND PARTIAL MEDIAL MENISCECTOMY;  Surgeon: Christena Flake, MD;  Location: [facility] ORS;  Service: Orthopedics;  Laterality: Left;  . KNEE ARTHROSCOPY WITH MEDIAL MENISECTOMY Right 11/27/2016   Procedure: KNEE ARTHROSCOPY WITH MEDIAL MENISECTOMY;  Surgeon: Christena Flake, MD;  Location: [facility];  Service: Orthopedics;  Laterality: Right;  . SINUS SURGERY WITH INSTATRAK    . VASECTOMY      Social History   Tobacco Use  . Smoking status: Former Smoker    Quit date: 12/30/1985    Years since quitting: 34.2  . Smokeless tobacco: Never Used  Substance Use Topics  . Alcohol use: Yes    Alcohol/week: 4.0 standard drinks    Types: 4 Standard drinks or equivalent per week    Comment:    . Drug use: No     Medication list has been reviewed and updated.  Current Meds  Medication Sig  . amLODipine (NORVASC) 5 MG tablet Take 1 tablet (5 mg total) by mouth daily.  Marland Kitchen atorvastatin (LIPITOR) 10 MG tablet Take 1 tablet (10 mg total) by mouth daily.  . clobetasol (TEMOVATE) 0.05 % external solution Apply 1 application topically 2 (two) times daily.  . clobetasol (TEMOVATE) 0.05 % GEL Apply 1 application topically 2 (two) times daily. To psoriasis lesions on trunk  . EPCLUSA 400-100 MG TABS Take 1 tablet by mouth daily.  Marland Kitchen lisinopril (PRINIVIL,ZESTRIL) 10 MG tablet Take 1 tablet  (10 mg total) by mouth daily.  . meloxicam (MOBIC) 7.5 MG tablet Take 1 tablet (7.5 mg total) by mouth daily.    PHQ 2/9 Scores 03/24/2020 11/08/2019 08/13/2019 03/24/2019  PHQ - 2 Score 0 0 0 0  PHQ- 9 Score 0 - 6 -    BP Readings from Last 3 Encounters:  03/24/20 138/78  01/10/20 131/80  11/08/19 132/80    Physical Exam Vitals and nursing note reviewed.  Constitutional:      Appearance: Normal appearance. He is well-developed.  HENT:     Head: Normocephalic.     Right Ear: Tympanic membrane, ear canal and external ear normal.     Left Ear: Tympanic membrane, ear canal and external ear normal.     Nose: Nose normal.     Mouth/Throat:     Pharynx: Uvula midline.  Eyes:     Conjunctiva/sclera: Conjunctivae normal.     Pupils: Pupils are equal, round, and reactive to light.  Neck:     Thyroid: No thyromegaly.     Vascular: No carotid bruit.  Cardiovascular:     Rate and Rhythm: Normal rate and regular rhythm.     Pulses: Normal pulses.     Heart sounds: Normal heart  sounds. No murmur.  Pulmonary:     Effort: Pulmonary effort is normal.     Breath sounds: Normal breath sounds. No wheezing or rhonchi.  Chest:     Breasts:        Right: No mass.        Left: No mass.  Abdominal:     General: Bowel sounds are normal.     Palpations: Abdomen is soft.     Tenderness: There is no abdominal tenderness.  Musculoskeletal:        General: Normal range of motion.     Cervical back: Normal range of motion and neck supple.     Right lower leg: No edema.     Left lower leg: No edema.  Lymphadenopathy:     Cervical: No cervical adenopathy.  Skin:    General: Skin is warm and dry.     Capillary Refill: Capillary refill takes less than 2 seconds.     Findings: Rash (scattered psoriatic lesions over chest, abdomen, legs) present.  Neurological:     General: No focal deficit present.     Mental Status: He is alert and oriented to person, place, and time.     Deep Tendon Reflexes:  Reflexes are normal and symmetric.  Psychiatric:        Speech: Speech normal.        Behavior: Behavior normal.        Thought Content: Thought content normal.        Judgment: Judgment normal.     Wt Readings from Last 3 Encounters:  03/24/20 (!) 319 lb (144.7 kg)  01/10/20 (!) 323 lb (146.5 kg)  11/08/19 (!) 321 lb (145.6 kg)    BP 138/78   Pulse 84   Temp 98 F (36.7 C) (Temporal)   Ht 6\' 4"  (1.93 m)   Wt (!) 319 lb (144.7 kg)   SpO2 95%   BMI 38.83 kg/m   Assessment and Plan: 1. Annual physical exam Normal exam other than weight - POCT urinalysis dipstick  2. Prostate cancer screening DRE deferred - PSA  3. Essential hypertension Clinically stable exam with well controlled BP on lisinopril and amlodipine. Tolerating medications without side effects at this time. Pt to continue current regimen and low sodium diet; benefits of regular exercise as able discussed. - CBC with Differential/Platelet  4. Hyperlipidemia associated with type 2 diabetes mellitus (HCC) Tolerating statin medication without side effects at this time LDL is almost at goal of < 70 on current dose Continue same therapy without change at this time. - Lipid panel  5. DM type 2, controlled, with complication (HCC) Clinically stable by exam and report without s/s of hypoglycemia. DM complicated by HTN, lipids. Reminded to schedule yearly DM eye exam. Continue diet, exercise and will advise if medication is needed - Hemoglobin A1c  6. Chronic hepatitis C without hepatic coma (HCC) Currently being treated for cure with Epclusa  7. BMI 38.0-38.9,adult Diet and exercise discussed He is now retired and has plans to stay active   Partially dictated using 01-11-1976. Any errors are unintentional.  Animal nutritionist, MD [facility]  03/24/2020

## 2020-03-25 LAB — CBC WITH DIFFERENTIAL/PLATELET
Basophils Absolute: 0.1 10*3/uL (ref 0.0–0.2)
Basos: 1 %
EOS (ABSOLUTE): 0.3 10*3/uL (ref 0.0–0.4)
Eos: 3 %
Hematocrit: 40.6 % (ref 37.5–51.0)
Hemoglobin: 13.9 g/dL (ref 13.0–17.7)
Immature Grans (Abs): 0 10*3/uL (ref 0.0–0.1)
Immature Granulocytes: 0 %
Lymphocytes Absolute: 1.8 10*3/uL (ref 0.7–3.1)
Lymphs: 25 %
MCH: 31.8 pg (ref 26.6–33.0)
MCHC: 34.2 g/dL (ref 31.5–35.7)
MCV: 93 fL (ref 79–97)
Monocytes Absolute: 0.6 10*3/uL (ref 0.1–0.9)
Monocytes: 8 %
Neutrophils Absolute: 4.7 10*3/uL (ref 1.4–7.0)
Neutrophils: 63 %
Platelets: 278 10*3/uL (ref 150–450)
RBC: 4.37 x10E6/uL (ref 4.14–5.80)
RDW: 12.9 % (ref 11.6–15.4)
WBC: 7.5 10*3/uL (ref 3.4–10.8)

## 2020-03-25 LAB — HEMOGLOBIN A1C
Est. average glucose Bld gHb Est-mCnc: 154 mg/dL
Hgb A1c MFr Bld: 7 % — ABNORMAL HIGH (ref 4.8–5.6)

## 2020-03-25 LAB — LIPID PANEL
Chol/HDL Ratio: 6.1 ratio — ABNORMAL HIGH (ref 0.0–5.0)
Cholesterol, Total: 218 mg/dL — ABNORMAL HIGH (ref 100–199)
HDL: 36 mg/dL — ABNORMAL LOW (ref >39)
LDL Chol Calc (NIH): 142 mg/dL — ABNORMAL HIGH (ref 0–99)
Triglycerides: 220 mg/dL — ABNORMAL HIGH (ref 0–149)
VLDL Cholesterol Cal: 40 mg/dL (ref 5–40)

## 2020-03-25 LAB — PSA: Prostate Specific Ag, Serum: 1.2 ng/mL (ref 0.0–4.0)

## 2020-04-17 ENCOUNTER — Other Ambulatory Visit: Payer: Self-pay

## 2020-04-17 DIAGNOSIS — B182 Chronic viral hepatitis C: Secondary | ICD-10-CM

## 2020-05-11 ENCOUNTER — Other Ambulatory Visit: Payer: Self-pay | Admitting: Internal Medicine

## 2020-05-11 DIAGNOSIS — I10 Essential (primary) hypertension: Secondary | ICD-10-CM

## 2020-05-16 ENCOUNTER — Other Ambulatory Visit: Payer: Self-pay

## 2020-05-19 ENCOUNTER — Other Ambulatory Visit: Payer: Self-pay | Admitting: Internal Medicine

## 2020-05-19 DIAGNOSIS — L409 Psoriasis, unspecified: Secondary | ICD-10-CM

## 2020-05-24 ENCOUNTER — Other Ambulatory Visit: Payer: Self-pay

## 2020-05-24 ENCOUNTER — Encounter: Payer: Self-pay | Admitting: Gastroenterology

## 2020-05-24 ENCOUNTER — Ambulatory Visit (INDEPENDENT_AMBULATORY_CARE_PROVIDER_SITE_OTHER): Payer: Medicare Other | Admitting: Gastroenterology

## 2020-05-24 VITALS — BP 161/91 | HR 66 | Temp 97.1°F | Ht 76.0 in | Wt 313.4 lb

## 2020-05-24 DIAGNOSIS — B182 Chronic viral hepatitis C: Secondary | ICD-10-CM

## 2020-05-24 NOTE — Progress Notes (Signed)
    Primary Care Physician: Reubin Milan, MD  Primary Gastroenterologist:  Dr. Midge Minium  Chief Complaint  Patient presents with  . Hepatitis C    treatment completed    HPI: Benjamin Medina is a 69 y.o. male here for follow-up of his hepatitis C.  The patient had seen me back in January of this year for treatment of his hepatitis C.  The patient was prescribed Epclusa.  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  The patient reports that he took the medication with no side effects.  He reports that he does not feel any different today.  Past Medical History:  Diagnosis Date  . Arthritis   . Auditory impairment   . BPH with urinary obstruction   . Contracture of hand    PALMAR FASCIA  . Diabetes mellitus without complication (HCC)   . Hypertension   . Psoriasis     Current Outpatient Medications  Medication Sig Dispense Refill  . amLODipine (NORVASC) 5 MG tablet TAKE 1 TABLET(5 MG) BY MOUTH DAILY 90 tablet 3  . atorvastatin (LIPITOR) 10 MG tablet Take 1 tablet (10 mg total) by mouth daily. 90 tablet 1  . clobetasol (TEMOVATE) 0.05 % external solution Apply 1 application topically 2 (two) times daily. 50 mL 2  . clobetasol (TEMOVATE) 0.05 % GEL APPLY TO AFFECTEED AREA ON THE LESIONS OF THE TRUCK TWICE DAILY 60 g 0  . lisinopril (ZESTRIL) 10 MG tablet TAKE 1 TABLET(10 MG) BY MOUTH DAILY 90 tablet 3  . meloxicam (MOBIC) 7.5 MG tablet TAKE 1 TABLET(7.5 MG) BY MOUTH DAILY 90 tablet 1  . EPCLUSA 400-100 MG TABS Take 1 tablet by mouth daily.     No current facility-administered medications for this visit.    Allergies as of 05/24/2020  . (No Known Allergies)    ROS:  General: Negative for anorexia, weight loss, fever, chills, fatigue, weakness. ENT: Negative for hoarseness, difficulty swallowing , nasal congestion. CV: Negative for chest pain, angina, palpitations, dyspnea on exertion, peripheral edema.  Respiratory: Negative for  dyspnea at rest, dyspnea on exertion, cough, sputum, wheezing.  GI: See history of present illness. GU:  Negative for dysuria, hematuria, urinary incontinence, urinary frequency, nocturnal urination.  Endo: Negative for unusual weight change.    Physical Examination:   BP (!) 161/91   Pulse 66   Temp (!) 97.1 F (36.2 C) (Temporal)   Ht 6\' 4"  (1.93 m)   Wt (!) 313 lb 6.4 oz (142.2 kg)   BMI 38.15 kg/m   General: Well-nourished, well-developed in no acute distress.  Eyes: No icterus. Conjunctivae pink. Extremities: No lower extremity edema. No clubbing or deformities. Neuro: Alert and oriented x 3.  Grossly intact. Skin: Warm and dry, no jaundice.   Psych: Alert and cooperative, normal mood and affect.  Labs:    Imaging Studies: No results found.  Assessment and Plan:   Benjamin Medina is a 69 y.o. y/o male who has completed his treatment for hepatitis C.  The patient will have his LFTs and viral load checked today.  He will also be checked in 6 months and a year.  The patient has been explained the plan and agrees with it.     79, MD. Midge Minium    Note: This dictation was prepared with Dragon dictation along with smaller phrase technology. Any transcriptional errors that result from this process are unintentional.

## 2020-05-25 ENCOUNTER — Telehealth: Payer: Self-pay | Admitting: Internal Medicine

## 2020-05-25 LAB — HEPATIC FUNCTION PANEL
ALT: 27 IU/L (ref 0–44)
AST: 16 IU/L (ref 0–40)
Albumin: 5 g/dL — ABNORMAL HIGH (ref 3.8–4.8)
Alkaline Phosphatase: 69 IU/L (ref 48–121)
Bilirubin Total: 0.3 mg/dL (ref 0.0–1.2)
Bilirubin, Direct: 0.08 mg/dL (ref 0.00–0.40)
Total Protein: 7.7 g/dL (ref 6.0–8.5)

## 2020-05-25 LAB — HCV RNA QUANT: Hepatitis C Quantitation: NOT DETECTED IU/mL

## 2020-05-25 NOTE — Telephone Encounter (Signed)
Copied from CRM [id number]. Topic: General - Other >> May 25, 2020 11:47 AM Elliot Gault wrote: [facility] faxing over a denial letter for clobetasol (TEMOVATE) 0.05 % GEL  denial letter faxed  to [phone]

## 2020-05-26 ENCOUNTER — Other Ambulatory Visit: Payer: Self-pay | Admitting: Internal Medicine

## 2020-05-26 DIAGNOSIS — L409 Psoriasis, unspecified: Secondary | ICD-10-CM

## 2020-05-26 MED ORDER — TRIAMCINOLONE ACETONIDE 0.5 % EX OINT: 1.0000 "application " | TOPICAL_OINTMENT | Freq: Two times a day (BID) | CUTANEOUS | 0 refills | Status: DC

## 2020-05-26 NOTE — Telephone Encounter (Signed)
Received FAX from [facility] for denial. Will discuss what to do with Dr. Judithann Graves and then call the pt.    CM

## 2020-05-30 ENCOUNTER — Telehealth: Payer: Self-pay

## 2020-05-30 NOTE — Telephone Encounter (Signed)
Pt notified of lab results

## 2020-05-30 NOTE — Telephone Encounter (Signed)
-----   Message from Midge Minium, MD sent at 05/30/2020  7:45 AM EDT ----- Let the patient know that his liver enzymes are now normal and his hepatitis C virus load was negative.

## 2020-06-01 DIAGNOSIS — Z79899 Other long term (current) drug therapy: Secondary | ICD-10-CM | POA: Diagnosis not present

## 2020-06-01 DIAGNOSIS — L400 Psoriasis vulgaris: Secondary | ICD-10-CM | POA: Diagnosis not present

## 2020-06-08 ENCOUNTER — Telehealth: Payer: Self-pay | Admitting: Internal Medicine

## 2020-06-08 NOTE — Telephone Encounter (Signed)
Patient would like to know if he should start taking atorvastatin (LIPITOR) 10 MG again due to him completing EPCLUSA 400-100 MG TABS , please advise  (Patient does not need a refill just clinical advice)

## 2020-06-08 NOTE — Telephone Encounter (Signed)
Yes and he needs to schedule a cholesterol, DM, HTN follow up in 3 months.

## 2020-06-08 NOTE — Telephone Encounter (Signed)
Please advise pt message. CM

## 2020-06-09 NOTE — Telephone Encounter (Signed)
Informed pt he should be taking atorvastatin again.   CM

## 2020-06-29 DIAGNOSIS — L400 Psoriasis vulgaris: Secondary | ICD-10-CM | POA: Diagnosis not present

## 2020-07-22 ENCOUNTER — Telehealth: Payer: Self-pay | Admitting: Internal Medicine

## 2020-07-22 DIAGNOSIS — L409 Psoriasis, unspecified: Secondary | ICD-10-CM

## 2020-07-26 NOTE — Telephone Encounter (Signed)
Pt called in and would like to know why this med was denied.   Best number [phone]

## 2020-07-27 ENCOUNTER — Other Ambulatory Visit: Payer: Self-pay | Admitting: Internal Medicine

## 2020-07-27 DIAGNOSIS — L409 Psoriasis, unspecified: Secondary | ICD-10-CM

## 2020-07-27 MED ORDER — CLOBETASOL PROPIONATE 0.05 % EX GEL: 1.0000 "application " | Freq: Two times a day (BID) | CUTANEOUS | 1 refills | Status: DC

## 2020-07-27 NOTE — Telephone Encounter (Signed)
Rx sent to [facility] in [place] 

## 2020-08-14 DIAGNOSIS — Z0120 Encounter for dental examination and cleaning without abnormal findings: Secondary | ICD-10-CM | POA: Diagnosis not present

## 2020-09-19 ENCOUNTER — Ambulatory Visit: Payer: BC Managed Care – PPO | Admitting: Internal Medicine

## 2020-09-19 DIAGNOSIS — Z0120 Encounter for dental examination and cleaning without abnormal findings: Secondary | ICD-10-CM | POA: Diagnosis not present

## 2020-09-25 ENCOUNTER — Ambulatory Visit (INDEPENDENT_AMBULATORY_CARE_PROVIDER_SITE_OTHER): Payer: Medicare Other | Admitting: Internal Medicine

## 2020-09-25 ENCOUNTER — Encounter: Payer: Self-pay | Admitting: Internal Medicine

## 2020-09-25 ENCOUNTER — Other Ambulatory Visit: Payer: Self-pay

## 2020-09-25 VITALS — BP 132/78 | HR 75 | Temp 98.2°F | Ht 76.0 in | Wt 312.0 lb

## 2020-09-25 DIAGNOSIS — E118 Type 2 diabetes mellitus with unspecified complications: Secondary | ICD-10-CM

## 2020-09-25 DIAGNOSIS — N401 Enlarged prostate with lower urinary tract symptoms: Secondary | ICD-10-CM | POA: Diagnosis not present

## 2020-09-25 DIAGNOSIS — E1169 Type 2 diabetes mellitus with other specified complication: Secondary | ICD-10-CM | POA: Diagnosis not present

## 2020-09-25 DIAGNOSIS — I10 Essential (primary) hypertension: Secondary | ICD-10-CM | POA: Diagnosis not present

## 2020-09-25 DIAGNOSIS — Z23 Encounter for immunization: Secondary | ICD-10-CM

## 2020-09-25 DIAGNOSIS — E785 Hyperlipidemia, unspecified: Secondary | ICD-10-CM

## 2020-09-25 DIAGNOSIS — N138 Other obstructive and reflux uropathy: Secondary | ICD-10-CM

## 2020-09-25 MED ORDER — TAMSULOSIN HCL 0.4 MG PO CAPS: 0.4000 mg | ORAL_CAPSULE | Freq: Every day | ORAL | 1 refills | Status: DC

## 2020-09-25 NOTE — Progress Notes (Signed)
Date:  09/25/2020   Name:  Benjamin Medina   DOB:  [birth year]   MRN:  [id number]   Chief Complaint: Hyperlipidemia (follow up ), Hypertension, and Diabetes  Hyperlipidemia This is a chronic problem. The problem is uncontrolled. Pertinent negatives include no chest pain or shortness of breath. Current antihyperlipidemic treatment includes statins.  Hypertension This is a chronic problem. The problem is controlled. Pertinent negatives include no blurred vision, chest pain, headaches, palpitations or shortness of breath. Past treatments include ACE inhibitors and calcium channel blockers. The current treatment provides significant improvement.  Diabetes He has type 2 diabetes mellitus. The initial diagnosis of diabetes was made 1 year ago. His disease course has been stable. Pertinent negatives for hypoglycemia include no headaches or tremors. Pertinent negatives for diabetes include no blurred vision, no chest pain, no fatigue, no foot paresthesias, no foot ulcerations, no polydipsia, no polyuria and no weight loss. There are no hypoglycemic complications. Symptoms are stable. Current diabetic treatment includes diet. He is compliant with treatment most of the time. He is following a generally healthy diet. An ACE inhibitor/angiotensin II receptor blocker is being taken. Eye exam is current.  Benign Prostatic Hypertrophy This is a chronic problem. The problem has been gradually worsening since onset. Irritative symptoms include frequency, nocturia and urgency. Obstructive symptoms include incomplete emptying. Pertinent negatives include no dysuria or hematuria. Nothing aggravates the symptoms. Past treatments include nothing.    Lab Results  Component Value Date   CREATININE 1.07 08/13/2019   BUN 12 08/13/2019   NA 140 08/13/2019   K 4.4 08/13/2019   CL 100 08/13/2019   CO2 24 08/13/2019   Lab Results  Component Value Date   CHOL 218 (H) 03/24/2020   HDL 36 (L) 03/24/2020   LDLCALC  142 (H) 03/24/2020   TRIG 220 (H) 03/24/2020   CHOLHDL 6.1 (H) 03/24/2020   Lab Results  Component Value Date   TSH 0.850 03/24/2019   Lab Results  Component Value Date   HGBA1C 7.0 (H) 03/24/2020   Lab Results  Component Value Date   WBC 7.5 03/24/2020   HGB 13.9 03/24/2020   HCT 40.6 03/24/2020   MCV 93 03/24/2020   PLT 278 03/24/2020   Lab Results  Component Value Date   ALT 27 05/24/2020   AST 16 05/24/2020   ALKPHOS 69 05/24/2020   BILITOT 0.3 05/24/2020     Review of Systems  Constitutional: Negative for appetite change, fatigue, unexpected weight change and weight loss.  Eyes: Negative for blurred vision and visual disturbance.  Respiratory: Negative for cough, shortness of breath and wheezing.   Cardiovascular: Negative for chest pain, palpitations and leg swelling.  Gastrointestinal: Negative for abdominal pain and blood in stool.  Endocrine: Negative for polydipsia and polyuria.  Genitourinary: Positive for frequency, incomplete emptying, nocturia and urgency. Negative for dysuria and hematuria.       Nocturia x 1-2  Skin: Negative for color change and rash.  Neurological: Negative for tremors, numbness and headaches.  Psychiatric/Behavioral: Negative for dysphoric mood.    Patient Active Problem List   Diagnosis Date Noted  . Chronic hepatitis C (HCC) 01/10/2020  . Severe obesity (HCC) 03/24/2019  . Hyperlipidemia associated with type 2 diabetes mellitus (HCC) 03/24/2019  . DM type 2, controlled, with complication (HCC) 06/12/2018  . Essential hypertension 06/02/2018  . Complex tear of medial meniscus of right knee as current injury 11/06/2016  . Primary osteoarthritis of right knee 11/06/2016  . Auditory  impairment 11/04/2015  . 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 Generalized psoriasis 11/04/2015  . Periodic limb movement 11/04/2015    No Known Allergies  Past Surgical  History:  Procedure Laterality Date  . BLEPHAROPLASTY  2016  . COLONOSCOPY WITH PROPOFOL N/A 05/01/2016   Procedure: COLONOSCOPY WITH PROPOFOL;  Surgeon: Earline Mayotte, MD;  [facility];  Service: Endoscopy;  Laterality: N/A;  . KNEE ARTHROSCOPY Left 01/14/2019   Procedure: ARTHROSCOPY KNEE WITH DEBRIDEMENT AND PARTIAL MEDIAL MENISCECTOMY;  Surgeon: Christena Flake, MD;  Location: [facility] ORS;  Service: Orthopedics;  Laterality: Left;  . KNEE ARTHROSCOPY WITH MEDIAL MENISECTOMY Right 11/27/2016   Procedure: KNEE ARTHROSCOPY WITH MEDIAL MENISECTOMY;  Surgeon: Christena Flake, MD;  Location: [facility];  Service: Orthopedics;  Laterality: Right;  . SINUS SURGERY WITH INSTATRAK    . VASECTOMY      Social History   Tobacco Use  . Smoking status: Former Smoker    Quit date: 12/30/1985    Years since quitting: 34.7  . Smokeless tobacco: Never Used  Vaping Use  . Vaping Use: Never used  Substance Use Topics  . Alcohol use: Yes    Alcohol/week: 4.0 standard drinks    Types: 4 Standard drinks or equivalent per week    Comment:    . Drug use: No     Medication list has been reviewed and updated.  Current Meds  Medication Sig  . amLODipine (NORVASC) 5 MG tablet TAKE 1 TABLET(5 MG) BY MOUTH DAILY  . atorvastatin (LIPITOR) 10 MG tablet Take 1 tablet (10 mg total) by mouth daily.  . clobetasol (TEMOVATE) 0.05 % GEL Apply 1 application topically 2 (two) times daily.  Marland Kitchen lisinopril (ZESTRIL) 10 MG tablet TAKE 1 TABLET(10 MG) BY MOUTH DAILY  . meloxicam (MOBIC) 7.5 MG tablet TAKE 1 TABLET(7.5 MG) BY MOUTH DAILY  . [DISCONTINUED] EPCLUSA 400-100 MG TABS Take 1 tablet by mouth daily.    PHQ 2/9 Scores 03/24/2020 11/08/2019 08/13/2019 03/24/2019  PHQ - 2 Score 0 0 0 0  PHQ- 9 Score 0 - 6 -    No flowsheet data found.  BP Readings from Last 3 Encounters:  09/25/20 132/78  05/24/20 (!) 161/91  03/24/20 138/78    Physical Exam Constitutional:      Appearance: Normal  appearance.  Cardiovascular:     Rate and Rhythm: Normal rate and regular rhythm.     Pulses: Normal pulses.     Heart sounds: No murmur heard.   Pulmonary:     Effort: Pulmonary effort is normal.     Breath sounds: Normal breath sounds. No wheezing or rhonchi.  Musculoskeletal:     Cervical back: Normal range of motion.     Right lower leg: No edema.     Left lower leg: No edema.  Skin:    Capillary Refill: Capillary refill takes less than 2 seconds.  Neurological:     General: No focal deficit present.     Mental Status: He is alert.  Psychiatric:        Mood and Affect: Mood normal.        Behavior: Behavior normal.     Wt Readings from Last 3 Encounters:  09/25/20 (!) 312 lb (141.5 kg)  05/24/20 (!) 313 lb 6.4 oz (142.2 kg)  03/24/20 (!) 319 lb (144.7 kg)    BP 132/78 (BP Location: Right Arm, Patient Position: Sitting)   Pulse 75  Temp 98.2 F (36.8 C) (Oral)   Ht 6\' 4"  (1.93 m)   Wt (!) 312 lb (141.5 kg)   SpO2 95%   BMI 37.98 kg/m   Assessment and Plan: 1. Essential hypertension Clinically stable exam with well controlled BP. Tolerating medications without side effects at this time. Pt to continue current regimen and low sodium diet; benefits of regular exercise as able discussed.  2. DM type 2, controlled, with complication (HCC) Diet controlled; discussed additional dietary improvements as well as more regular exercise [id number] given today - Comprehensive metabolic panel - Hemoglobin A1c  3. Hyperlipidemia associated with type 2 diabetes mellitus (HCC) Tolerating statin medication without side effects at this time LDL is not at goal of < 70 on current dose but pt has not be consistent in dosing Continue same therapy without change at this time.  4. Benign prostatic hyperplasia with urinary obstruction PSA normal recently Hx of BPH but no currently on treatment - tamsulosin (FLOMAX) 0.4 MG CAPS capsule; Take 1 capsule (0.4 mg total) by mouth daily.   Dispense: 90 capsule; Refill: 1  5. Need for immunization against influenza - Flu Vaccine QUAD High Dose(Fluad)   Partially dictated using . Any errors are unintentional.  Animal nutritionist, MD [facility]  09/25/2020

## 2020-09-26 LAB — COMPREHENSIVE METABOLIC PANEL
ALT: 35 IU/L (ref 0–44)
AST: 24 IU/L (ref 0–40)
Albumin/Globulin Ratio: 1.7 (ref 1.2–2.2)
Albumin: 4.9 g/dL — ABNORMAL HIGH (ref 3.8–4.8)
Alkaline Phosphatase: 69 IU/L (ref 44–121)
BUN/Creatinine Ratio: 17 (ref 10–24)
BUN: 18 mg/dL (ref 8–27)
Bilirubin Total: 0.3 mg/dL (ref 0.0–1.2)
CO2: 19 mmol/L — ABNORMAL LOW (ref 20–29)
Calcium: 9.7 mg/dL (ref 8.6–10.2)
Chloride: 103 mmol/L (ref 96–106)
Creatinine, Ser: 1.08 mg/dL (ref 0.76–1.27)
GFR calc Af Amer: 81 mL/min/{1.73_m2} (ref >59)
GFR calc non Af Amer: 70 mL/min/{1.73_m2} (ref >59)
Globulin, Total: 2.9 g/dL (ref 1.5–4.5)
Glucose: 95 mg/dL (ref 65–99)
Potassium: 4.5 mmol/L (ref 3.5–5.2)
Sodium: 139 mmol/L (ref 134–144)
Total Protein: 7.8 g/dL (ref 6.0–8.5)

## 2020-09-26 LAB — HEMOGLOBIN A1C
Est. average glucose Bld gHb Est-mCnc: 134 mg/dL
Hgb A1c MFr Bld: 6.3 % — ABNORMAL HIGH (ref 4.8–5.6)

## 2020-10-09 ENCOUNTER — Telehealth: Payer: Self-pay | Admitting: Internal Medicine

## 2020-10-09 NOTE — Telephone Encounter (Signed)
Left message for patient to call back and schedule Medicare Annual Wellness Visit (AWV) either virtually/audio only or in office. Whichever the patients preference is.  No history of AWV; please schedule at anytime with [facility] Health Advisor.  This should be a 40 minute visit  AWV-I PER PAL[place] AS OF 11/30/2019 

## 2020-10-10 DIAGNOSIS — M359 Systemic involvement of connective tissue, unspecified: Secondary | ICD-10-CM | POA: Diagnosis not present

## 2020-11-23 DIAGNOSIS — Z20822 Contact with and (suspected) exposure to covid-19: Secondary | ICD-10-CM | POA: Diagnosis not present

## 2020-11-24 DIAGNOSIS — Z20822 Contact with and (suspected) exposure to covid-19: Secondary | ICD-10-CM | POA: Diagnosis not present

## 2020-11-29 ENCOUNTER — Other Ambulatory Visit: Payer: Self-pay | Admitting: Internal Medicine

## 2021-01-04 DIAGNOSIS — Z79899 Other long term (current) drug therapy: Secondary | ICD-10-CM | POA: Diagnosis not present

## 2021-01-04 DIAGNOSIS — M359 Systemic involvement of connective tissue, unspecified: Secondary | ICD-10-CM | POA: Diagnosis not present

## 2021-01-04 DIAGNOSIS — L400 Psoriasis vulgaris: Secondary | ICD-10-CM | POA: Diagnosis not present

## 2021-01-09 DIAGNOSIS — L400 Psoriasis vulgaris: Secondary | ICD-10-CM | POA: Diagnosis not present

## 2021-03-06 ENCOUNTER — Telehealth: Payer: Self-pay | Admitting: Internal Medicine

## 2021-03-06 NOTE — Telephone Encounter (Signed)
Left message for patient to call back and schedule Medicare Annual Wellness Visit (AWV) either virtually/audio only or in office. Whichever the patients preference is.  No history of AWV; please schedule at anytime with [facility] Health Advisor.  This should be a 40 minute visit  AWV-I PER [place] AS OF 11/30/2019

## 2021-03-12 ENCOUNTER — Other Ambulatory Visit: Payer: Self-pay

## 2021-03-12 ENCOUNTER — Ambulatory Visit (INDEPENDENT_AMBULATORY_CARE_PROVIDER_SITE_OTHER): Payer: Medicare Other

## 2021-03-12 VITALS — BP 126/84 | HR 79 | Temp 97.9°F | Resp 16 | Ht 76.0 in | Wt 322.2 lb

## 2021-03-12 DIAGNOSIS — Z0000 Encounter for general adult medical examination without abnormal findings: Secondary | ICD-10-CM

## 2021-03-12 NOTE — Progress Notes (Signed)
Subjective:   Benjamin Medina is a 70 y.o. male who presents for an Initial Medicare Annual Wellness Visit.  Review of Systems     Cardiac Risk Factors include: advanced age (>90men, >48 women);dyslipidemia;male gender;hypertension;diabetes mellitus;obesity (BMI >30kg/m2)     Objective:    Today's Vitals   03/12/21 1054 03/12/21 1055  BP: 126/84   Pulse: 79   Resp: 16   Temp: 97.9 F (36.6 C)   TempSrc: Oral   SpO2: 98%   Weight: (!) 322 lb 3.2 oz (146.1 kg)   Height: 6\' 4"  (1.93 m)   PainSc:  6    Body mass index is 39.22 kg/m.  Advanced Directives 03/12/2021 01/14/2019 11/27/2016 06/12/2016  Does Patient Have a Medical Advance Directive? (No Data) No No Yes  Type of Advance Directive - - 06/14/2016;Living will  Would patient like information on creating a medical advance directive? - No - Patient declined No - Patient declined -    Current Medications (verified) Outpatient Encounter Medications as of 03/12/2021  Medication Sig  . amLODipine (NORVASC) 5 MG tablet TAKE 1 TABLET(5 MG) BY MOUTH DAILY  . clobetasol (TEMOVATE) 0.05 % GEL Apply 1 application topically 2 (two) times daily.  03/14/2021 lisinopril (ZESTRIL) 10 MG tablet TAKE 1 TABLET(10 MG) BY MOUTH DAILY  . meloxicam (MOBIC) 7.5 MG tablet TAKE 1 TABLET(7.5 MG) BY MOUTH DAILY  . [DISCONTINUED] triamcinolone (KENALOG) 0.025 % cream Apply topically 2 (two) times daily.  Marland Kitchen atorvastatin (LIPITOR) 10 MG tablet Take 1 tablet (10 mg total) by mouth daily. (Patient not taking: Reported on 03/12/2021)  . [DISCONTINUED] tamsulosin (FLOMAX) 0.4 MG CAPS capsule Take 1 capsule (0.4 mg total) by mouth daily.  . [DISCONTINUED] triamcinolone ointment (KENALOG) 0.5 % Apply 1 application topically 2 (two) times daily. To lesions on trunk (Patient not taking: Reported on 09/25/2020)   No facility-administered encounter medications on file as of 03/12/2021.    Allergies (verified) Patient has no known allergies.    History: Past Medical History:  Diagnosis Date  . Arthritis   . Auditory impairment   . BPH with urinary obstruction   . Contracture of hand    PALMAR FASCIA  . Diabetes mellitus without complication (HCC)   . Hyperlipidemia   . Hypertension   . Psoriasis    Past Surgical History:  Procedure Laterality Date  . BLEPHAROPLASTY  2016  . COLONOSCOPY WITH PROPOFOL N/A 05/01/2016   Procedure: COLONOSCOPY WITH PROPOFOL;  Surgeon: 07/01/2016, MD;  Location: [facility] ENDOSCOPY;  Service: Endoscopy;  Laterality: N/A;  . KNEE ARTHROSCOPY Left 01/14/2019   Procedure: ARTHROSCOPY KNEE WITH DEBRIDEMENT AND PARTIAL MEDIAL MENISCECTOMY;  Surgeon: 01/16/2019, MD;  Location: [facility] ORS;  Service: Orthopedics;  Laterality: Left;  . KNEE ARTHROSCOPY WITH MEDIAL MENISECTOMY Right 11/27/2016   Procedure: KNEE ARTHROSCOPY WITH MEDIAL MENISECTOMY;  Surgeon: 11/29/2016, MD;  Location: [facility];  Service: Orthopedics;  Laterality: Right;  . SINUS SURGERY WITH INSTATRAK    . VASECTOMY     Family History  Problem Relation Age of Onset  . Colon cancer Mother 56  . Lymphoma Father    Social History   Socioeconomic History  . Marital status: Married    Spouse name: Not on file  . Number of children: 1  . Years of education: Not on file  . Highest education level: High school graduate  Occupational History  . Not on file  Tobacco Use  . Smoking status:  Former Smoker    Quit date: 12/30/1985    Years since quitting: 35.2  . Smokeless tobacco: Never Used  Vaping Use  . Vaping Use: Never used  Substance and Sexual Activity  . Alcohol use: Yes    Alcohol/week: 4.0 standard drinks    Types: 4 Standard drinks or equivalent per week    Comment:    . Drug use: No  . Sexual activity: Not on file  Other Topics Concern  . Not on file  Social History Narrative   Works part time as truck Electrical engineer Strain: Low Risk   . Difficulty of  Paying Living Expenses: Not hard at all  Food Insecurity: No Food Insecurity  . Worried About Programme researcher, broadcasting/film/video in the Last Year: Never true  . Ran Out of Food in the Last Year: Never true  Transportation Needs: No Transportation Needs  . Lack of Transportation (Medical): No  . Lack of Transportation (Non-Medical): No  Physical Activity: Insufficiently Active  . Days of Exercise per Week: 3 days  . Minutes of Exercise per Session: 30 min  Stress: No Stress Concern Present  . Feeling of Stress : Not at all  Social Connections: Moderately Isolated  . Frequency of Communication with Friends and Family: More than three times a week  . Frequency of Social Gatherings with Friends and Family: Twice a week  . Attends Religious Services: Never  . Active Member of Clubs or Organizations: No  . Attends Banker Meetings: Never  . Marital Status: Married    Tobacco Counseling Counseling given: Not Answered   Clinical Intake:  Pre-visit preparation completed: Yes  Pain : 0-10 Pain Score: 6  Pain Type: Acute pain Pain Location: Back Pain Orientation: Right,Lower Pain Descriptors / Indicators: Aching,Discomfort,Sore Pain Onset: More than a month ago Pain Frequency: Constant     BMI - recorded: 39.22 Nutritional Status: BMI > 30  Obese Nutritional Risks: None Diabetes: Yes CBG done?: No Did pt. bring in CBG monitor from home?: No  How often do you need to have someone help you when you read instructions, pamphlets, or other written materials from your doctor or pharmacy?: 1 - Never Nutrition Risk Assessment:  Has the patient had any N/V/D within the last 2 months?  No  Does the patient have any non-healing wounds?  No  Has the patient had any unintentional weight loss or weight gain?  No   Diabetes:  Is the patient diabetic?  Yes  If diabetic, was a CBG obtained today?  No  Did the patient bring in their glucometer from home?  No  How often do you monitor your  CBG's? Pt does not actively check blood sugar .   Financial Strains and Diabetes Management:  Are you having any financial strains with the device, your supplies or your medication? No .  Does the patient want to be seen by Chronic Care Management for management of their diabetes?  No  Would the patient like to be referred to a Nutritionist or for Diabetic Management?  No   Diabetic Exams:  Diabetic Eye Exam: Completed per patient; will request records from [facility].   Diabetic Foot Exam: Completed 09/25/20.   Interpreter Needed?: No  Information entered by :: Reather Littler LPN   Activities of Daily Living In your present state of health, do you have any difficulty performing the following activities: 03/12/2021 03/24/2020  Hearing? [place]  Comment has hearing aids doesn't wear them -  Vision? N N  Difficulty concentrating or making decisions? N N  Walking or climbing stairs? N N  Dressing or bathing? N N  Doing errands, shopping? N N  Preparing Food and eating ? N -  Using the Toilet? N -  In the past six months, have you accidently leaked urine? N -  Do you have problems with loss of bowel control? N -  Managing your Medications? N -  Managing your Finances? N -  Housekeeping or managing your Housekeeping? N -  Some recent data might be hidden    Patient Care Team: Reubin MilanBerglund, Laura H, MD as PCP - General (Internal Medicine)  Indicate any recent Medical Services you may have received from other than [facility] providers in the past year (date may be approximate).     Assessment:   This is a routine wellness examination for Benjamin Medina.  Hearing/Vision screen  Hearing Screening   125Hz  250Hz  500Hz  1000Hz  2000Hz  3000Hz  4000Hz  6000Hz  8000Hz   Right ear:           Left ear:           Comments: Pt c/o mild hearing difficulty; has hearing aids at home that he rarely wears   Vision Screening Comments: [facility]  Dietary issues and  exercise activities discussed: Current Exercise Habits: Home exercise routine, Type of exercise: walking, Time (Minutes): 30, Frequency (Times/Week): 3, Weekly Exercise (Minutes/Week): 90, Intensity: Mild, Exercise limited by: orthopedic condition(s)  Goals    . DIET - INCREASE WATER INTAKE     Recommend drinking 6-8 glasses of water per day       Depression Screen PHQ 2/9 Scores 03/12/2021 03/24/2020 11/08/2019 08/13/2019 03/24/2019 10/15/2018 06/02/2018  PHQ - 2 Score 0 0 0 0 0 0 1  PHQ- 9 Score - 0 - 6 - - -    Fall Risk Fall Risk  03/12/2021 03/24/2020 08/13/2019 03/24/2019 10/15/2018  Falls in the past year? 0 0 0 0 No  Number falls in past yr: 0 0 0 0 -  Injury with Fall? 0 0 0 0 -  Risk for fall due to : No Fall Risks No Fall Risks - - -  Follow up Falls prevention discussed Falls evaluation completed Falls evaluation completed Falls evaluation completed -    FALL RISK PREVENTION PERTAINING TO THE HOME:  Any stairs in or around the home? Yes  If so, are there any without handrails? Yes  - outside steps Home free of loose throw rugs in walkways, pet beds, electrical cords, etc? Yes  Adequate lighting in your home to reduce risk of falls? Yes   ASSISTIVE DEVICES UTILIZED TO PREVENT FALLS:  Life alert? No  Use of a cane, walker or w/c? No  Grab bars in the bathroom? No  Shower chair or bench in shower? No  Elevated toilet seat or a handicapped toilet? No   TIMED UP AND GO:  Was the test performed? Yes .  Length of time to ambulate 10 feet: 5 sec.   Gait steady and fast without use of assistive device  Cognitive Function:     6CIT Screen 03/12/2021  What Year? 0 points  What month? 0 points  What time? 0 points  Count back from 20 0 points  Months in reverse 4 points  Repeat phrase 0 points  Total Score 4    Immunizations Immunization History  Administered Date(s) Administered  .  Fluad Quad(high Dose 65+) 09/25/2020  . Influenza,inj,Quad PF,6+ Mos 10/15/2018,  10/09/2019  . Moderna Sars-Covid-2 Vaccination 11/29/2020  . PFIZER(Purple Top)SARS-COV-2 Vaccination 02/13/2020, 03/07/2020  . Pneumococcal Conjugate-13 10/15/2018  . Pneumococcal Polysaccharide-23 09/25/2020  . Tdap 05/08/2011  . Zoster 05/08/2011    TDAP status: Up to date  Flu Vaccine status: Up to date  Pneumococcal vaccine status: Up to date  Covid-19 vaccine status: Completed vaccines  Qualifies for Shingles Vaccine? Yes   Zostavax completed No   Shingrix Completed?: No.    Education has been provided regarding the importance of this vaccine. Patient has been advised to call insurance company to determine out of pocket expense if they have not yet received this vaccine. Advised may also receive vaccine at local pharmacy or Health Dept. Verbalized acceptance and understanding.  Screening Tests Health Maintenance  Topic Date Due  . OPHTHALMOLOGY EXAM  10/04/2020  . HEMOGLOBIN A1C  03/25/2021  . TETANUS/TDAP  05/07/2021  . FOOT EXAM  09/25/2021  . COLONOSCOPY (Pts 45-60yrs Insurance coverage will need to be confirmed)  05/01/2026  . INFLUENZA VACCINE  Completed  . COVID-19 Vaccine  Completed  . Hepatitis C Screening  Completed  . PNA vac Low Risk Adult  Completed  . HPV VACCINES  Aged Out    Health Maintenance  Health Maintenance Due  Topic Date Due  . OPHTHALMOLOGY EXAM  10/04/2020    Colorectal cancer screening: Type of screening: Colonoscopy. Completed 05/01/16. Repeat every 10 years  Lung Cancer Screening: (Low Dose CT Chest recommended if Age 82-80 years, 30 pack-year currently smoking OR have quit w/in 15years.) does not qualify.     Additional Screening:  Hepatitis C Screening: does qualify; Completed 05/24/20  Vision Screening: Recommended annual ophthalmology exams for early detection of glaucoma and other disorders of the eye. Is the patient up to date with their annual eye exam?  Yes  Who is the provider or what is the name of the office in which the  patient attends annual eye exams? Dr. Clydene Pugh  Dental Screening: Recommended annual dental exams for proper oral hygiene  Community Resource Referral / Chronic Care Management: CRR required this visit?  No   CCM required this visit?  No      Plan:     I have personally reviewed and noted the following in the patient's chart:   . Medical and social history . Use of alcohol, tobacco or illicit drugs  . Current medications and supplements . Functional ability and status . Nutritional status . Physical activity . Advanced directives . List of other physicians . Hospitalizations, surgeries, and ER visits in previous 12 months . Vitals . Screenings to include cognitive, depression, and falls . Referrals and appointments  In addition, I have reviewed and discussed with patient certain preventive protocols, quality metrics, and best practice recommendations. A written personalized care plan for preventive services as well as general preventive health recommendations were provided to patient.     Reather Littler, LPN   05/17/8415   Nurse Notes: pt c/o lower right side back pain near hip. Pt feels it is muscle related and has been ongoing since January. 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 Pt adivsed to discuss at upcoming appt on 03/26/21; pt states relief with tylenol.

## 2021-03-12 NOTE — Patient Instructions (Signed)
Benjamin Medina , Thank you for taking time to come for your Medicare Wellness Visit. I appreciate your ongoing commitment to your health goals. Please review the following plan we discussed and let me know if I can assist you in the future.   Screening recommendations/referrals: Colonoscopy: done 05/01/16. Repeat in 2027 Recommended yearly ophthalmology/optometry visit for glaucoma screening and checkup Recommended yearly dental visit for hygiene and checkup  Vaccinations: Influenza vaccine: done 09/25/20 Pneumococcal vaccine: done 09/25/20 Tdap vaccine: done 05/08/11 Shingles vaccine: Shingrix discussed. Please contact your pharmacy for coverage information.  Covid-19:  Done 02/13/20, 03/07/20 & 11/29/20  Advanced directives: Please bring a copy of your health care power of attorney and living will to the office at your convenience if you have that paperwork at home.   Conditions/risks identified: Recommend drinking 6-8 glasses of water per day.   Next appointment: Follow up in one year for your annual wellness visit.   Preventive Care 33 Years and Older, Male Preventive care refers to lifestyle choices and visits with your health care provider that can promote health and wellness. What does preventive care include?  A yearly physical exam. This is also called an annual well check.  Dental exams once or twice a year.  Routine eye exams. Ask your health care provider how often you should have your eyes checked.  Personal lifestyle choices, including:  Daily care of your teeth and gums.  Regular physical activity.  Eating a healthy diet.  Avoiding tobacco and drug use.  Limiting alcohol use.  Practicing safe sex.  Taking low doses of aspirin every day.  Taking vitamin and mineral supplements as recommended by your health care provider. What happens during an annual well check? The services and screenings done by your health care provider during your annual well check will depend on  your age, overall health, lifestyle risk factors, and family history of disease. Counseling  Your health care provider may ask you questions about your:  Alcohol use.  Tobacco use.  Drug use.  Emotional well-being.  Home and relationship well-being.  Sexual activity.  Eating habits.  History of falls.  Memory and ability to understand (cognition).  Work and work Astronomer. Screening  You may have the following tests or measurements:  Height, weight, and BMI.  Blood pressure.  Lipid and cholesterol levels. These may be checked every 5 years, or more frequently if you are over 45 years old.  Skin check.  Lung cancer screening. You may have this screening every year starting at age 58 if you have a 30-pack-year history of smoking and currently smoke or have quit within the past 15 years.  Fecal occult blood test (FOBT) of the stool. You may have this test every year starting at age 25.  Flexible sigmoidoscopy or colonoscopy. You may have a sigmoidoscopy every 5 years or a colonoscopy every 10 years starting at age 49.  Prostate cancer screening. Recommendations will vary depending on your family history and other risks.  Hepatitis C blood test.  Hepatitis B blood test.  Sexually transmitted disease (STD) testing.  Diabetes screening. This is done by checking your blood sugar (glucose) after you have not eaten for a while (fasting). You may have this done every 1-3 years.  Abdominal aortic aneurysm (AAA) screening. You may need this if you are a current or former smoker.  Osteoporosis. You may be screened starting at age 76 if you are at high risk. Talk with your health care provider about your test results,  treatment options, and if necessary, the need for more tests. Vaccines  Your health care provider may recommend certain vaccines, such as:  Influenza vaccine. This is recommended every year.  Tetanus, diphtheria, and acellular pertussis (Tdap, Td) vaccine.  You may need a Td booster every 10 years.  Zoster vaccine. You may need this after age 50.  Pneumococcal 13-valent conjugate (PCV13) vaccine. One dose is recommended after age 67.  Pneumococcal polysaccharide (PPSV23) vaccine. One dose is recommended after age 84. Talk to your health care provider about which screenings and vaccines you need and how often you need them. This information is not intended to replace advice given to you by your health care provider. Make sure you discuss any questions you have with your health care provider. Document Released: 01/12/2016 Document Revised: 09/04/2016 Document Reviewed: 10/17/2015 Elsevier Interactive Patient Education  2017 [employer] Prevention in the Home Falls can cause injuries. They can happen to people of all ages. There are many things you can do to make your home safe and to help prevent falls. What can I do on the outside of my home?  Regularly fix the edges of walkways and driveways and fix any cracks.  Remove anything that might make you trip as you walk through a door, such as a raised step or threshold.  Trim any bushes or trees on the path to your home.  Use bright outdoor lighting.  Clear any walking paths of anything that might make someone trip, such as rocks or tools.  Regularly check to see if handrails are loose or broken. Make sure that both sides of any steps have handrails.  Any raised decks and porches should have guardrails on the edges.  Have any leaves, snow, or ice cleared regularly.  Use sand or salt on walking paths during winter.  Clean up any spills in your garage right away. This includes oil or grease spills. What can I do in the bathroom?  Use night lights.  Install grab bars by the toilet and in the tub and shower. Do not use towel bars as grab bars.  Use non-skid mats or decals in the tub or shower.  If you need to sit down in the shower, use a plastic, non-slip stool.  Keep the  floor dry. Clean up any water that spills on the floor as soon as it happens.  Remove soap buildup in the tub or shower regularly.  Attach bath mats securely with double-sided non-slip rug tape.  Do not have throw rugs and other things on the floor that can make you trip. What can I do in the bedroom?  Use night lights.  Make sure that you have a light by your bed that is easy to reach.  Do not use any sheets or blankets that are too big for your bed. They should not hang down onto the floor.  Have a firm chair that has side arms. You can use this for support while you get dressed.  Do not have throw rugs and other things on the floor that can make you trip. What can I do in the kitchen?  Clean up any spills right away.  Avoid walking on wet floors.  Keep items that you use a lot in easy-to-reach places.  If you need to reach something above you, use a strong step stool that has a grab bar.  Keep electrical cords out of the way.  Do not use floor polish or wax that makes floors  slippery. If you must use wax, use non-skid floor wax.  Do not have throw rugs and other things on the floor that can make you trip. What can I do with my stairs?  Do not leave any items on the stairs.  Make sure that there are handrails on both sides of the stairs and use them. Fix handrails that are broken or loose. Make sure that handrails are as long as the stairways.  Check any carpeting to make sure that it is firmly attached to the stairs. Fix any carpet that is loose or worn.  Avoid having throw rugs at the top or bottom of the stairs. If you do have throw rugs, attach them to the floor with carpet tape.  Make sure that you have a light switch at the top of the stairs and the bottom of the stairs. If you do not have them, ask someone to add them for you. What else can I do to help prevent falls?  Wear shoes that:  Do not have high heels.  Have rubber bottoms.  Are comfortable and fit  you well.  Are closed at the toe. Do not wear sandals.  If you use a stepladder:  Make sure that it is fully opened. Do not climb a closed stepladder.  Make sure that both sides of the stepladder are locked into place.  Ask someone to hold it for you, if possible.  Clearly mark and make sure that you can see:  Any grab bars or handrails.  First and last steps.  Where the edge of each step is.  Use tools that help you move around (mobility aids) if they are needed. These include:  Canes.  Walkers.  Scooters.  Crutches.  Turn on the lights when you go into a dark area. Replace any light bulbs as soon as they burn out.  Set up your furniture so you have a clear path. Avoid moving your furniture around.  If any of your floors are uneven, fix them.  If there are any pets around you, be aware of where they are.  Review your medicines with your doctor. Some medicines can make you feel dizzy. This can increase your chance of falling. Ask your doctor what other things that you can do to help prevent falls. This information is not intended to replace advice given to you by your health care provider. Make sure you discuss any questions you have with your health care provider. Document Released: 10/12/2009 Document Revised: 05/23/2016 Document Reviewed: 01/20/2015 Elsevier Interactive Patient Education  2017 [employer].

## 2021-03-25 NOTE — Progress Notes (Signed)
Date:  03/26/2021   Name:  Benjamin Medina   DOB:  29-Apr-1951   MRN:  [id number]   Chief Complaint: Annual Exam  Benjamin Medina is a 70 y.o. male who presents today for his Complete Annual Exam. He feels fairly well. He reports exercising - walking 3 x weekly. He reports he is sleeping fairly well.   Colonoscopy: 04/2016 repeat 2027 Eye exam due  Immunization History  Administered Date(s) Administered  . Fluad Quad(high Dose 65+) 09/25/2020  . Influenza,inj,Quad PF,6+ Mos 10/15/2018, 10/09/2019  . Moderna Sars-Covid-2 Vaccination 11/29/2020  . PFIZER(Purple Top)SARS-COV-2 Vaccination 02/13/2020, 03/07/2020  . Pneumococcal Conjugate-13 10/15/2018  . Pneumococcal Polysaccharide-23 09/25/2020  . Tdap 05/08/2011  . Zoster 05/08/2011    Diabetes He presents for his follow-up diabetic visit. He has type 2 diabetes mellitus. His disease course has been stable. Pertinent negatives for hypoglycemia include no dizziness or headaches. Pertinent negatives for diabetes include no chest pain and no fatigue. Pertinent negatives for diabetic complications include no CVA. Current diabetic treatment includes diet. He is compliant with treatment most of the time. An ACE inhibitor/angiotensin II receptor blocker is being taken. Eye exam is not current.  Hypertension This is a chronic problem. The problem is controlled. Pertinent negatives include no chest pain, headaches, palpitations or shortness of breath. Past treatments include ACE inhibitors and calcium channel blockers. The current treatment provides significant improvement. There is no history of kidney disease, CAD/MI or CVA.  Hyperlipidemia This is a chronic problem. The problem is controlled. Pertinent negatives include no chest pain, myalgias or shortness of breath. Current antihyperlipidemic treatment includes statins (not taking lipitor currently).  Knee Pain  There was no injury mechanism. The pain is present in the right knee. The  quality of the pain is described as aching. The pain is mild. The pain has been fluctuating since onset. He has tried NSAIDs for the symptoms. The treatment provided moderate relief.  Back Pain This is a chronic problem. The current episode started more than 1 month ago. The problem occurs every several days. The problem has been waxing and waning since onset. The pain is present in the lumbar spine (right sided muscles). The pain does not radiate. Pertinent negatives include no abdominal pain, chest pain, dysuria or headaches.    Lab Results  Component Value Date   CREATININE 1.08 09/25/2020   BUN 18 09/25/2020   NA 139 09/25/2020   K 4.5 09/25/2020   CL 103 09/25/2020   CO2 19 (L) 09/25/2020   Lab Results  Component Value Date   CHOL 218 (H) 03/24/2020   HDL 36 (L) 03/24/2020   LDLCALC 142 (H) 03/24/2020   TRIG 220 (H) 03/24/2020   CHOLHDL 6.1 (H) 03/24/2020   Lab Results  Component Value Date   TSH 0.850 03/24/2019   Lab Results  Component Value Date   HGBA1C 6.3 (H) 09/25/2020   Lab Results  Component Value Date   WBC 7.5 03/24/2020   HGB 13.9 03/24/2020   HCT 40.6 03/24/2020   MCV 93 03/24/2020   PLT 278 03/24/2020   Lab Results  Component Value Date   ALT 35 09/25/2020   AST 24 09/25/2020   ALKPHOS 69 09/25/2020   BILITOT 0.3 09/25/2020     Review of Systems  Constitutional: Negative for appetite change, chills, diaphoresis, fatigue and unexpected weight change.  HENT: Negative for hearing loss, tinnitus, trouble swallowing and voice change.   Eyes: Negative for visual disturbance.  Respiratory: Negative  for choking, shortness of breath and wheezing.   Cardiovascular: Negative for chest pain, palpitations and leg swelling.  Gastrointestinal: Negative for abdominal pain, blood in stool, constipation and diarrhea.  Genitourinary: Negative for difficulty urinating, dysuria, frequency and hematuria.  Musculoskeletal: Positive for arthralgias (both knees), back  pain and gait problem. Negative for myalgias.  Skin: Negative for color change and rash.  Neurological: Negative for dizziness, syncope and headaches.  Hematological: Negative for adenopathy.  Psychiatric/Behavioral: Negative for dysphoric mood and sleep disturbance.    Patient Active Problem List   Diagnosis Date Noted  . Hx of hepatitis C 01/10/2020  . Hyperlipidemia associated with type 2 diabetes mellitus (HCC) 03/24/2019  . DM type 2, controlled, with complication (HCC) 06/12/2018  . Essential hypertension 06/02/2018  . Complex tear of medial meniscus of right knee as current injury 11/06/2016  . Primary osteoarthritis of right knee 11/06/2016  . Auditory impairment 11/04/2015  . 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 Generalized psoriasis 11/04/2015  . Periodic limb movement 11/04/2015    No Known Allergies  Past Surgical History:  Procedure Laterality Date  . BLEPHAROPLASTY  2016  . COLONOSCOPY WITH PROPOFOL N/A 05/01/2016   Procedure: COLONOSCOPY WITH PROPOFOL;  Surgeon: Earline Mayotte, MD;  Location: [facility] ENDOSCOPY;  Service: Endoscopy;  Laterality: N/A;  . KNEE ARTHROSCOPY Left 01/14/2019   Procedure: ARTHROSCOPY KNEE WITH DEBRIDEMENT AND PARTIAL MEDIAL MENISCECTOMY;  Surgeon: Christena Flake, MD;  Location: [facility] ORS;  Service: Orthopedics;  Laterality: Left;  . KNEE ARTHROSCOPY WITH MEDIAL MENISECTOMY Right 11/27/2016   Procedure: KNEE ARTHROSCOPY WITH MEDIAL MENISECTOMY;  Surgeon: Christena Flake, MD;  Location: [facility];  Service: Orthopedics;  Laterality: Right;  . SINUS SURGERY WITH INSTATRAK    . VASECTOMY      Social History   Tobacco Use  . Smoking status: Former Smoker    Quit date: 12/30/1985    Years since quitting: 35.2  . Smokeless tobacco: Never Used  Vaping Use  . Vaping Use: Never used  Substance Use Topics  . Alcohol use: Yes    Alcohol/week: 4.0 standard drinks     Types: 4 Standard drinks or equivalent per week    Comment:    . Drug use: No     Medication list has been reviewed and updated.  Current Meds  Medication Sig  . clobetasol (TEMOVATE) 0.05 % GEL Apply 1 application topically 2 (two) times daily.  . meloxicam (MOBIC) 7.5 MG tablet TAKE 1 TABLET(7.5 MG) BY MOUTH DAILY  . Tildrakizumab-asmn (ILUMYA ) Inject into the skin every 3 (three) months.  . [DISCONTINUED] amLODipine (NORVASC) 5 MG tablet TAKE 1 TABLET(5 MG) BY MOUTH DAILY  . [DISCONTINUED] lisinopril (ZESTRIL) 10 MG tablet TAKE 1 TABLET(10 MG) BY MOUTH DAILY    PHQ 2/9 Scores 03/26/2021 03/12/2021 03/24/2020 11/08/2019  PHQ - 2 Score 0 0 0 0  PHQ- 9 Score 0 - 0 -    GAD 7 : Generalized Anxiety Score 03/26/2021  Nervous, Anxious, on Edge 0  Control/stop worrying 0  Worry too much - different things 0  Trouble relaxing 0  Restless 0  Easily annoyed or irritable 0  Afraid - awful might happen 0  Total GAD 7 Score 0  Anxiety Difficulty Not difficult at all    BP Readings from Last 3 Encounters:  03/26/21 132/84  03/12/21 126/84  09/25/20 132/78    Physical Exam Vitals and nursing note reviewed.  Constitutional:      Appearance: Normal appearance. He is well-developed.  HENT:     Head: Normocephalic.     Right Ear: Tympanic membrane, ear canal and external ear normal.     Left Ear: Tympanic membrane, ear canal and external ear normal.     Nose: Nose normal.  Eyes:     Conjunctiva/sclera: Conjunctivae normal.     Pupils: Pupils are equal, round, and reactive to light.  Neck:     Thyroid: No thyromegaly.     Vascular: No carotid bruit.  Cardiovascular:     Rate and Rhythm: Normal rate and regular rhythm.     Heart sounds: Normal heart sounds.  Pulmonary:     Effort: Pulmonary effort is normal.     Breath sounds: Normal breath sounds. No wheezing.  Chest:  Breasts:     Right: No mass.     Left: No mass.    Abdominal:     General: Bowel sounds are normal.      Palpations: Abdomen is soft.     Tenderness: There is no abdominal tenderness.  Musculoskeletal:     Cervical back: Normal range of motion and neck supple.     Lumbar back: Tenderness (along right paraspinous muscles) present. No swelling or spasms. Negative right straight leg raise test.     Right knee: Effusion present.     Left knee: No effusion.  Lymphadenopathy:     Cervical: No cervical adenopathy.  Skin:    General: Skin is warm and dry.  Neurological:     Mental Status: He is alert and oriented to person, place, and time.     Deep Tendon Reflexes: Reflexes are normal and symmetric.  Psychiatric:        Attention and Perception: Attention normal.        Mood and Affect: Mood normal.        Thought Content: Thought content normal.     Wt Readings from Last 3 Encounters:  03/26/21 (!) 323 lb 12.8 oz (146.9 kg)  03/12/21 (!) 322 lb 3.2 oz (146.1 kg)  09/25/20 (!) 312 lb (141.5 kg)    BP 132/84   Pulse 73   Ht 6\' 4"  (1.93 m)   Wt (!) 323 lb 12.8 oz (146.9 kg)   SpO2 96%   BMI 39.41 kg/m   Assessment and Plan: 1. Annual physical exam Exam is normal except for weight. Encourage regular exercise and appropriate dietary changes. Vaccinations and screenings are up to date.  2. DM type 2, controlled, with complication (HCC) Diet controlled; recommend Eye exam and resuming atorvastatin - Comprehensive metabolic panel - Hemoglobin A1c  3. Essential hypertension Clinically stable exam with well controlled BP. Tolerating medications without side effects at this time. Pt to continue current regimen and low sodium diet; benefits of regular exercise as able discussed. - CBC with Differential/Platelet - POCT urinalysis dipstick - amLODipine (NORVASC) 5 MG tablet; TAKE 1 TABLET(5 MG) BY MOUTH DAILY  Dispense: 90 tablet; Refill: 3 - lisinopril (ZESTRIL) 10 MG tablet; TAKE 1 TABLET(10 MG) BY MOUTH DAILY  Dispense: 90 tablet; Refill: 3  4. Hyperlipidemia associated with  type 2 diabetes mellitus (HCC) Need to resume statin for primary prevention Will check labs next visit - Lipid panel - atorvastatin (LIPITOR) 10 MG tablet; Take 1 tablet (10 mg total) by mouth daily.  Dispense: 90 tablet; Refill: 1  5. Benign prostatic hyperplasia with urinary obstruction DRE deferred Not currently on treatment or seeing Urology -  PSA  6. Hx of hepatitis C Due for one year recheck of labs s/p treatment - HCV RNA quant  7. Primary osteoarthritis of right knee Recommend that he see Ortho   Partially dictated using Animal nutritionist. Any errors are unintentional.  Bari Edward, MD [facility]  03/26/2021

## 2021-03-26 ENCOUNTER — Other Ambulatory Visit: Payer: Self-pay

## 2021-03-26 ENCOUNTER — Ambulatory Visit (INDEPENDENT_AMBULATORY_CARE_PROVIDER_SITE_OTHER): Payer: Medicare Other | Admitting: Internal Medicine

## 2021-03-26 ENCOUNTER — Encounter: Payer: Self-pay | Admitting: Internal Medicine

## 2021-03-26 VITALS — BP 132/84 | HR 73 | Ht 76.0 in | Wt 323.8 lb

## 2021-03-26 DIAGNOSIS — Z0000 Encounter for general adult medical examination without abnormal findings: Secondary | ICD-10-CM | POA: Diagnosis not present

## 2021-03-26 DIAGNOSIS — N401 Enlarged prostate with lower urinary tract symptoms: Secondary | ICD-10-CM

## 2021-03-26 DIAGNOSIS — I10 Essential (primary) hypertension: Secondary | ICD-10-CM | POA: Diagnosis not present

## 2021-03-26 DIAGNOSIS — Z8619 Personal history of other infectious and parasitic diseases: Secondary | ICD-10-CM | POA: Diagnosis not present

## 2021-03-26 DIAGNOSIS — N138 Other obstructive and reflux uropathy: Secondary | ICD-10-CM

## 2021-03-26 DIAGNOSIS — E1169 Type 2 diabetes mellitus with other specified complication: Secondary | ICD-10-CM

## 2021-03-26 DIAGNOSIS — M1711 Unilateral primary osteoarthritis, right knee: Secondary | ICD-10-CM

## 2021-03-26 DIAGNOSIS — E785 Hyperlipidemia, unspecified: Secondary | ICD-10-CM | POA: Diagnosis not present

## 2021-03-26 DIAGNOSIS — E118 Type 2 diabetes mellitus with unspecified complications: Secondary | ICD-10-CM | POA: Diagnosis not present

## 2021-03-26 LAB — POCT URINALYSIS DIPSTICK
Bilirubin, UA: NEGATIVE
Blood, UA: NEGATIVE
Glucose, UA: NEGATIVE
Ketones, UA: NEGATIVE
Leukocytes, UA: NEGATIVE
Nitrite, UA: NEGATIVE
Protein, UA: NEGATIVE
Spec Grav, UA: 1.015 (ref 1.010–1.025)
Urobilinogen, UA: 0.2 E.U./dL
pH, UA: 6 (ref 5.0–8.0)

## 2021-03-26 MED ORDER — ATORVASTATIN CALCIUM 10 MG PO TABS: 10.0000 mg | ORAL_TABLET | Freq: Every day | ORAL | 1 refills | Status: DC

## 2021-03-26 MED ORDER — LISINOPRIL 10 MG PO TABS: ORAL_TABLET | ORAL | 3 refills | Status: DC

## 2021-03-26 MED ORDER — AMLODIPINE BESYLATE 5 MG PO TABS: ORAL_TABLET | ORAL | 3 refills | Status: DC

## 2021-03-26 NOTE — Patient Instructions (Signed)
Schedule Diabetic Eye exam  Resume Atorvastatin

## 2021-03-27 LAB — CBC WITH DIFFERENTIAL/PLATELET
Basophils Absolute: 0.1 10*3/uL (ref 0.0–0.2)
Basos: 1 %
EOS (ABSOLUTE): 0.3 10*3/uL (ref 0.0–0.4)
Eos: 4 %
Hematocrit: 44.5 % (ref 37.5–51.0)
Hemoglobin: 14.6 g/dL (ref 13.0–17.7)
Immature Grans (Abs): 0 10*3/uL (ref 0.0–0.1)
Immature Granulocytes: 0 %
Lymphocytes Absolute: 1.9 10*3/uL (ref 0.7–3.1)
Lymphs: 24 %
MCH: 30.9 pg (ref 26.6–33.0)
MCHC: 32.8 g/dL (ref 31.5–35.7)
MCV: 94 fL (ref 79–97)
Monocytes Absolute: 0.6 10*3/uL (ref 0.1–0.9)
Monocytes: 8 %
Neutrophils Absolute: 4.9 10*3/uL (ref 1.4–7.0)
Neutrophils: 63 %
Platelets: 298 10*3/uL (ref 150–450)
RBC: 4.73 x10E6/uL (ref 4.14–5.80)
RDW: 13.1 % (ref 11.6–15.4)
WBC: 7.9 10*3/uL (ref 3.4–10.8)

## 2021-03-27 LAB — COMPREHENSIVE METABOLIC PANEL
ALT: 27 IU/L (ref 0–44)
AST: 21 IU/L (ref 0–40)
Albumin/Globulin Ratio: 1.8 (ref 1.2–2.2)
Albumin: 4.9 g/dL — ABNORMAL HIGH (ref 3.8–4.8)
Alkaline Phosphatase: 70 IU/L (ref 44–121)
BUN/Creatinine Ratio: 13 (ref 10–24)
BUN: 14 mg/dL (ref 8–27)
Bilirubin Total: 0.3 mg/dL (ref 0.0–1.2)
CO2: 20 mmol/L (ref 20–29)
Calcium: 9.8 mg/dL (ref 8.6–10.2)
Chloride: 102 mmol/L (ref 96–106)
Creatinine, Ser: 1.04 mg/dL (ref 0.76–1.27)
Globulin, Total: 2.8 g/dL (ref 1.5–4.5)
Glucose: 124 mg/dL — ABNORMAL HIGH (ref 65–99)
Potassium: 4.8 mmol/L (ref 3.5–5.2)
Sodium: 138 mmol/L (ref 134–144)
Total Protein: 7.7 g/dL (ref 6.0–8.5)
eGFR: 78 mL/min/{1.73_m2} (ref >59)

## 2021-03-27 LAB — LIPID PANEL
Chol/HDL Ratio: 5.6 ratio — ABNORMAL HIGH (ref 0.0–5.0)
Cholesterol, Total: 231 mg/dL — ABNORMAL HIGH (ref 100–199)
HDL: 41 mg/dL (ref >39)
LDL Chol Calc (NIH): 157 mg/dL — ABNORMAL HIGH (ref 0–99)
Triglycerides: 180 mg/dL — ABNORMAL HIGH (ref 0–149)
VLDL Cholesterol Cal: 33 mg/dL (ref 5–40)

## 2021-03-27 LAB — PSA: Prostate Specific Ag, Serum: 1.6 ng/mL (ref 0.0–4.0)

## 2021-03-27 LAB — HCV RNA QUANT: Hepatitis C Quantitation: NOT DETECTED IU/mL

## 2021-03-27 LAB — HEMOGLOBIN A1C
Est. average glucose Bld gHb Est-mCnc: 148 mg/dL
Hgb A1c MFr Bld: 6.8 % — ABNORMAL HIGH (ref 4.8–5.6)

## 2021-03-28 DIAGNOSIS — M1711 Unilateral primary osteoarthritis, right knee: Secondary | ICD-10-CM | POA: Diagnosis not present

## 2021-03-28 DIAGNOSIS — E669 Obesity, unspecified: Secondary | ICD-10-CM | POA: Diagnosis not present

## 2021-03-28 DIAGNOSIS — M25561 Pain in right knee: Secondary | ICD-10-CM | POA: Diagnosis not present

## 2021-04-05 DIAGNOSIS — M359 Systemic involvement of connective tissue, unspecified: Secondary | ICD-10-CM | POA: Diagnosis not present

## 2021-04-06 ENCOUNTER — Encounter: Payer: Self-pay | Admitting: Internal Medicine

## 2021-04-06 NOTE — Progress Notes (Signed)
His last eye exam was in 2020.  Call him to get that done soon and to continue yearly.

## 2021-05-28 ENCOUNTER — Other Ambulatory Visit: Payer: Self-pay | Admitting: Internal Medicine

## 2021-06-20 DIAGNOSIS — E669 Obesity, unspecified: Secondary | ICD-10-CM | POA: Diagnosis not present

## 2021-06-20 DIAGNOSIS — M1711 Unilateral primary osteoarthritis, right knee: Secondary | ICD-10-CM | POA: Diagnosis not present

## 2021-06-27 DIAGNOSIS — M1711 Unilateral primary osteoarthritis, right knee: Secondary | ICD-10-CM | POA: Diagnosis not present

## 2021-07-04 DIAGNOSIS — M1711 Unilateral primary osteoarthritis, right knee: Secondary | ICD-10-CM | POA: Diagnosis not present

## 2021-07-05 DIAGNOSIS — Z79899 Other long term (current) drug therapy: Secondary | ICD-10-CM | POA: Diagnosis not present

## 2021-07-05 DIAGNOSIS — L400 Psoriasis vulgaris: Secondary | ICD-10-CM | POA: Diagnosis not present

## 2021-07-05 DIAGNOSIS — Z7689 Persons encountering health services in other specified circumstances: Secondary | ICD-10-CM | POA: Diagnosis not present

## 2021-07-05 DIAGNOSIS — Z9889 Other specified postprocedural states: Secondary | ICD-10-CM | POA: Diagnosis not present

## 2021-07-11 DIAGNOSIS — M1711 Unilateral primary osteoarthritis, right knee: Secondary | ICD-10-CM | POA: Diagnosis not present

## 2021-07-18 ENCOUNTER — Ambulatory Visit: Payer: Medicare Other | Admitting: Internal Medicine

## 2021-09-07 ENCOUNTER — Other Ambulatory Visit: Payer: Self-pay | Admitting: Internal Medicine

## 2021-09-07 DIAGNOSIS — E1169 Type 2 diabetes mellitus with other specified complication: Secondary | ICD-10-CM

## 2021-09-08 NOTE — Telephone Encounter (Signed)
Requested Prescriptions  Pending Prescriptions Disp Refills  . atorvastatin (LIPITOR) 10 MG tablet [Pharmacy Med Name: ATORVASTATIN 10MG  TABLETS] 90 tablet 1    Sig: TAKE 1 TABLET(10 MG) BY MOUTH DAILY     Cardiovascular:  Antilipid - Statins Failed - 09/07/2021  5:21 PM      Failed - Total Cholesterol in normal range and within 360 days    Cholesterol, Total  Date Value Ref Range Status  03/26/2021 231 (H) 100 - 199 mg/dL Final         Failed - LDL in normal range and within 360 days    LDL Chol Calc (NIH)  Date Value Ref Range Status  03/26/2021 157 (H) 0 - 99 mg/dL Final         Failed - Triglycerides in normal range and within 360 days    Triglycerides  Date Value Ref Range Status  03/26/2021 180 (H) 0 - 149 mg/dL Final         Passed - HDL in normal range and within 360 days    HDL  Date Value Ref Range Status  03/26/2021 41 >39 mg/dL Final         Passed - Patient is not pregnant      Passed - Valid encounter within last 12 months    Recent Outpatient Visits          5 months ago Annual physical exam   [facility], [place]   11 months ago Essential hypertension   [facility], [place]   1 year ago Annual physical exam   [facility], [place]   1 year ago Psoriasis   [facility], [place]   2 years ago Essential hypertension   [facility] [place]      Future Appointments            In 2 days Reubin Milan, MD [facility]   In 6 months [facility] Judithann Graves, MD [facility]

## 2021-09-10 ENCOUNTER — Other Ambulatory Visit: Payer: Self-pay

## 2021-09-10 ENCOUNTER — Ambulatory Visit (INDEPENDENT_AMBULATORY_CARE_PROVIDER_SITE_OTHER): Payer: Medicare Other | Admitting: Internal Medicine

## 2021-09-10 ENCOUNTER — Encounter: Payer: Self-pay | Admitting: Internal Medicine

## 2021-09-10 VITALS — BP 138/88 | HR 76 | Temp 97.7°F | Ht 76.0 in | Wt 311.2 lb

## 2021-09-10 DIAGNOSIS — E785 Hyperlipidemia, unspecified: Secondary | ICD-10-CM | POA: Diagnosis not present

## 2021-09-10 DIAGNOSIS — E118 Type 2 diabetes mellitus with unspecified complications: Secondary | ICD-10-CM

## 2021-09-10 DIAGNOSIS — I10 Essential (primary) hypertension: Secondary | ICD-10-CM | POA: Diagnosis not present

## 2021-09-10 DIAGNOSIS — H6123 Impacted cerumen, bilateral: Secondary | ICD-10-CM

## 2021-09-10 DIAGNOSIS — E1169 Type 2 diabetes mellitus with other specified complication: Secondary | ICD-10-CM | POA: Diagnosis not present

## 2021-09-10 MED ORDER — MELOXICAM 7.5 MG PO TABS: ORAL_TABLET | ORAL | 1 refills | Status: DC

## 2021-09-10 NOTE — Progress Notes (Signed)
Date:  09/10/2021   Name:  Benjamin Medina   DOB:  1951/06/30   MRN:  [id number]   Chief Complaint: Diabetes, Hypertension, Immunizations (Reg dose flu shot- declined high dose. ), and Ear Fullness (Last few months. Sometimes feels pressure in them. No drainage. Concerned of being blocked up with wax.)  Hypertension This is a chronic problem. The problem is controlled. Pertinent negatives include no chest pain, headaches, palpitations or shortness of breath. Past treatments include calcium channel blockers and ACE inhibitors. The current treatment provides significant improvement. There are no compliance problems.  There is no history of kidney disease, CAD/MI or CVA.  Diabetes He presents for his follow-up diabetic visit. He has type 2 diabetes mellitus. His disease course has been stable. Pertinent negatives for hypoglycemia include no headaches or tremors. Pertinent negatives for diabetes include no chest pain, no fatigue, no polydipsia and no polyuria. Pertinent negatives for diabetic complications include no CVA. Current diabetic treatment includes diet. He is compliant with treatment most of the time. An ACE inhibitor/angiotensin II receptor blocker is being taken.  Hyperlipidemia This is a chronic problem. The problem is uncontrolled. Recent lipid tests were reviewed and are high. Pertinent negatives include no chest pain or shortness of breath. Treatments tried: resumed lipitor last visit.  Ear Fullness  There is pain in both ears. This is a new problem. The current episode started more than 1 month ago. The problem has been waxing and waning. There has been no fever. The pain is at a severity of 2/10. The pain is mild. Pertinent negatives include no abdominal pain, coughing, headaches or rash. He has tried nothing for the symptoms.   Lab Results  Component Value Date   CREATININE 1.04 03/26/2021   BUN 14 03/26/2021   NA 138 03/26/2021   K 4.8 03/26/2021   CL 102 03/26/2021   CO2  20 03/26/2021   Lab Results  Component Value Date   CHOL 231 (H) 03/26/2021   HDL 41 03/26/2021   LDLCALC 157 (H) 03/26/2021   TRIG 180 (H) 03/26/2021   CHOLHDL 5.6 (H) 03/26/2021   Lab Results  Component Value Date   TSH 0.850 03/24/2019   Lab Results  Component Value Date   HGBA1C 6.8 (H) 03/26/2021   Lab Results  Component Value Date   WBC 7.9 03/26/2021   HGB 14.6 03/26/2021   HCT 44.5 03/26/2021   MCV 94 03/26/2021   PLT 298 03/26/2021   Lab Results  Component Value Date   ALT 27 03/26/2021   AST 21 03/26/2021   ALKPHOS 70 03/26/2021   BILITOT 0.3 03/26/2021     Review of Systems  Constitutional:  Negative for appetite change, fatigue and unexpected weight change.  HENT:         Air fullness  Eyes:  Negative for visual disturbance.  Respiratory:  Negative for cough, shortness of breath and wheezing.   Cardiovascular:  Negative for chest pain, palpitations and leg swelling.  Gastrointestinal:  Negative for abdominal pain and blood in stool.  Endocrine: Negative for polydipsia and polyuria.  Genitourinary:  Negative for dysuria and hematuria.  Skin:  Negative for color change and rash.  Neurological:  Negative for tremors, numbness and headaches.  Psychiatric/Behavioral:  Negative for dysphoric mood.    Patient Active Problem List   Diagnosis Date Noted   Hx of hepatitis C 01/10/2020   Hyperlipidemia associated with type 2 diabetes mellitus (HCC) 03/24/2019   DM type 2, controlled, with  complication (HCC) 06/12/2018   Essential hypertension 06/02/2018   Complex tear of medial meniscus of right knee as current injury 11/06/2016   Primary osteoarthritis of right knee 11/06/2016   Auditory impairment 11/04/2015   Benign prostatic hyperplasia with urinary obstruction 11/04/2015   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   Generalized psoriasis 11/04/2015   Periodic limb movement 11/04/2015    No Known Allergies  Past Surgical History:  Procedure  Laterality Date   BLEPHAROPLASTY  2016   COLONOSCOPY WITH PROPOFOL N/A 05/01/2016   Procedure: COLONOSCOPY WITH PROPOFOL;  Surgeon: Earline Mayotte, MD;  Location: [facility] END[facility];  Service: Endoscopy;  Laterality: N/A;   KNEE ARTHROSCOPY Left 01/14/2019   Procedure: ARTHROSCOPY KNEE WITH DEBRIDEMENT AND PARTIAL MEDIAL MENISCECTOMY;  Surgeon: Christena Flake, MD;  Location: [facility] ORS;  Service: Orthopedics;  Laterality: Left;   KNEE ARTHROSCOPY WITH MEDIAL MENISECTOMY Right 11/27/2016   Procedure: KNEE ARTHROSCOPY WITH MEDIAL MENISECTOMY;  Surgeon: Christena Flake, MD;  Location: [facility];  Service: Orthopedics;  Laterality: Right;   SINUS SURGERY WITH INSTATRAK     VASECTOMY      Social History   Tobacco Use   Smoking status: Former    Types: Cigarettes    Quit date: 12/30/1985    Years since quitting: 35.7   Smokeless tobacco: Never  Vaping Use   Vaping Use: Never used  Substance Use Topics   Alcohol use: Yes    Alcohol/week: 4.0 standard drinks    Types: 4 Standard drinks or equivalent per week    Comment:     Drug use: No     Medication list has been reviewed and updated.  Current Meds  Medication Sig   amLODipine (NORVASC) 5 MG tablet TAKE 1 TABLET(5 MG) BY MOUTH DAILY   atorvastatin (LIPITOR) 10 MG tablet TAKE 1 TABLET(10 MG) BY MOUTH DAILY   clobetasol (TEMOVATE) 0.05 % GEL Apply 1 application topically 2 (two) times daily.   lisinopril (ZESTRIL) 10 MG tablet TAKE 1 TABLET(10 MG) BY MOUTH DAILY   meloxicam (MOBIC) 7.5 MG tablet TAKE 1 TABLET(7.5 MG) BY MOUTH DAILY   Tildrakizumab-asmn (ILUMYA [place]) Inject into the skin every 3 (three) months.    PHQ 2/9 Scores 09/10/2021 03/26/2021 03/12/2021 03/24/2020  PHQ - 2 Score 0 0 0 0  PHQ- 9 Score 1 0 - 0    GAD 7 : Generalized Anxiety Score 03/26/2021  Nervous, Anxious, on Edge 0  Control/stop worrying 0  Worry too much - different things 0  Trouble relaxing 0  Restless 0  Easily annoyed or irritable 0  Afraid - awful  might happen 0  Total GAD 7 Score 0  Anxiety Difficulty Not difficult at all    BP Readings from Last 3 Encounters:  09/10/21 (!) 138/96  03/26/21 132/84  03/12/21 126/84    Physical Exam Vitals and nursing note reviewed.  Constitutional:      General: He is not in acute distress.    Appearance: He is well-developed.  HENT:     Head: Normocephalic and atraumatic.     Right Ear: There is impacted cerumen.     Left Ear: There is impacted cerumen.     Ears:     Comments: Large amt cerumen removed from right canal with curette - TM and canal now normal  Large amt cerumen in the left canal - flushed out 75%, remaining cerumen adhered to the ear canal and could not be retrieved. Neck:     Vascular:  No carotid bruit.  Cardiovascular:     Rate and Rhythm: Normal rate and regular rhythm.     Pulses: Normal pulses.     Heart sounds: No murmur heard. Pulmonary:     Effort: Pulmonary effort is normal. No respiratory distress.     Breath sounds: No wheezing or rhonchi.  Musculoskeletal:        General: Normal range of motion.     Cervical back: Normal range of motion.     Right lower leg: No edema.     Left lower leg: No edema.  Lymphadenopathy:     Cervical: No cervical adenopathy.  Skin:    General: Skin is warm and dry.     Findings: No rash.  Neurological:     Mental Status: He is alert and oriented to person, place, and time.  Psychiatric:        Mood and Affect: Mood normal.        Behavior: Behavior normal.    Wt Readings from Last 3 Encounters:  09/10/21 (!) 311 lb 3.2 oz (141.2 kg)  03/26/21 (!) 323 lb 12.8 oz (146.9 kg)  03/12/21 (!) 322 lb 3.2 oz (146.1 kg)    BP (!) 138/96 (BP Location: Right Arm, Patient Position: Sitting, Cuff Size: Large)   Pulse 76   Temp 97.7 F (36.5 C) (Oral)   Ht 6\' 4"  (1.93 m)   Wt (!) 311 lb 3.2 oz (141.2 kg)   SpO2 (!) 14%   PF 96 L/min   BMI 37.88 kg/m   Assessment and Plan: 1. Essential hypertension Clinically stable  exam with well controlled BP. Tolerating medications without side effects at this time. Pt to continue current regimen and low sodium diet; benefits of regular exercise as able discussed.  2. DM type 2, controlled, with complication (HCC) Clinically stable by exam and report without s/s of hypoglycemia. DM complicated by hypertension and dyslipidemia. Continue diet and weight control efforts - Comprehensive metabolic panel - Hemoglobin A1c  3. Hyperlipidemia associated with type 2 diabetes mellitus (HCC) Now back on atorvastatin daily - Lipid panel  4. Impacted cerumen of both ears Right cleared with curette Left mostly cleared with flushing Pt felt improvement after the procedure   Partially dictated using . Any errors are unintentional.  Animal nutritionist, MD [facility]  09/10/2021

## 2021-09-11 LAB — COMPREHENSIVE METABOLIC PANEL
ALT: 32 IU/L (ref 0–44)
AST: 22 IU/L (ref 0–40)
Albumin/Globulin Ratio: 1.7 (ref 1.2–2.2)
Albumin: 4.9 g/dL — ABNORMAL HIGH (ref 3.8–4.8)
Alkaline Phosphatase: 79 IU/L (ref 44–121)
BUN/Creatinine Ratio: 16 (ref 10–24)
BUN: 17 mg/dL (ref 8–27)
Bilirubin Total: 0.3 mg/dL (ref 0.0–1.2)
CO2: 22 mmol/L (ref 20–29)
Calcium: 9.8 mg/dL (ref 8.6–10.2)
Chloride: 102 mmol/L (ref 96–106)
Creatinine, Ser: 1.04 mg/dL (ref 0.76–1.27)
Globulin, Total: 2.9 g/dL (ref 1.5–4.5)
Glucose: 116 mg/dL — ABNORMAL HIGH (ref 65–99)
Potassium: 5.1 mmol/L (ref 3.5–5.2)
Sodium: 140 mmol/L (ref 134–144)
Total Protein: 7.8 g/dL (ref 6.0–8.5)
eGFR: 78 mL/min/{1.73_m2} (ref >59)

## 2021-09-11 LAB — LIPID PANEL
Chol/HDL Ratio: 4.2 ratio (ref 0.0–5.0)
Cholesterol, Total: 172 mg/dL (ref 100–199)
HDL: 41 mg/dL (ref >39)
LDL Chol Calc (NIH): 113 mg/dL — ABNORMAL HIGH (ref 0–99)
Triglycerides: 96 mg/dL (ref 0–149)
VLDL Cholesterol Cal: 18 mg/dL (ref 5–40)

## 2021-09-11 LAB — HEMOGLOBIN A1C
Est. average glucose Bld gHb Est-mCnc: 137 mg/dL
Hgb A1c MFr Bld: 6.4 % — ABNORMAL HIGH (ref 4.8–5.6)

## 2021-10-04 DIAGNOSIS — M359 Systemic involvement of connective tissue, unspecified: Secondary | ICD-10-CM | POA: Diagnosis not present

## 2021-12-03 DIAGNOSIS — E119 Type 2 diabetes mellitus without complications: Secondary | ICD-10-CM | POA: Diagnosis not present

## 2021-12-03 DIAGNOSIS — H5203 Hypermetropia, bilateral: Secondary | ICD-10-CM | POA: Diagnosis not present

## 2021-12-03 DIAGNOSIS — H1045 Other chronic allergic conjunctivitis: Secondary | ICD-10-CM | POA: Diagnosis not present

## 2021-12-03 LAB — HM DIABETES EYE EXAM

## 2021-12-10 ENCOUNTER — Ambulatory Visit: Payer: Self-pay | Admitting: *Deleted

## 2021-12-10 NOTE — Telephone Encounter (Signed)
Noted. We will get urine sample at appt tomorrow.

## 2021-12-10 NOTE — Telephone Encounter (Signed)
  Chief Complaint: blood in urine Symptoms: blood in urine , stream difficulty starting stream  Frequency: no Pertinent Negatives: Patient denies fever, flank/ back pain  Disposition: [] ED /[] Urgent Care (no appt availability in office) / [x] Appointment(In office/virtual)/ []  [facility]/ [] Home Care/ [] Refused Recommended Disposition  Additional Notes:  blood in urine last night before bed and this am upon awakening

## 2021-12-10 NOTE — Telephone Encounter (Signed)
Reason for Disposition  Blood in urine  (Exception: could be normal menstrual bleeding)  Answer Assessment - Initial Assessment Questions 1. COLOR of URINE: "Describe the color of the urine."  (e.g., tea-colored, pink, red, blood clots, bloody)     Bloody urine  2. ONSET: "When did the bleeding start?"      1 day  3. EPISODES: "How many times has there been blood in the urine?" or "How many times today?"     Last night prior to bed and this am waking up  4. PAIN with URINATION: "Is there any pain with passing your urine?" If Yes, ask: "How bad is the pain?"  (Scale 1-10; or mild, moderate, severe)    - MILD - complains slightly about urination hurting    - MODERATE - interferes with normal activities      - SEVERE - excruciating, unwilling or unable to urinate because of the pain      Difficulty starting stream  5. FEVER: "Do you have a fever?" If Yes, ask: "What is your temperature, how was it measured, and when did it start?"     no 6. ASSOCIATED SYMPTOMS: "Are you passing urine more frequently than usual?"     No  7. OTHER SYMPTOMS: "Do you have any other symptoms?" (e.g., back/flank pain, abdominal pain, vomiting)     No  8. PREGNANCY: "Is there any chance you are pregnant?" "When was your last menstrual period?"     na  Protocols used: Urine - Blood In-A-AH

## 2021-12-11 ENCOUNTER — Other Ambulatory Visit: Payer: Self-pay

## 2021-12-11 ENCOUNTER — Ambulatory Visit (INDEPENDENT_AMBULATORY_CARE_PROVIDER_SITE_OTHER): Payer: Medicare Other | Admitting: Internal Medicine

## 2021-12-11 ENCOUNTER — Encounter: Payer: Self-pay | Admitting: Internal Medicine

## 2021-12-11 VITALS — BP 138/86 | HR 85 | Temp 97.8°F | Ht 76.0 in | Wt 312.0 lb

## 2021-12-11 DIAGNOSIS — N401 Enlarged prostate with lower urinary tract symptoms: Secondary | ICD-10-CM

## 2021-12-11 DIAGNOSIS — N138 Other obstructive and reflux uropathy: Secondary | ICD-10-CM | POA: Diagnosis not present

## 2021-12-11 DIAGNOSIS — R310 Gross hematuria: Secondary | ICD-10-CM

## 2021-12-11 MED ORDER — SULFAMETHOXAZOLE-TRIMETHOPRIM 800-160 MG PO TABS: 1.0000 | ORAL_TABLET | Freq: Two times a day (BID) | ORAL | 0 refills | Status: DC

## 2021-12-11 NOTE — Progress Notes (Signed)
Date:  12/11/2021   Name:  Benjamin Medina   DOB:  1951/03/18   MRN:  [id number]   Chief Complaint: Hematuria  Urinary Frequency  This is a new problem. The current episode started in the past 7 days. The problem has been gradually worsening. The quality of the pain is described as burning and aching. The pain is moderate. There has been no fever. He is Not sexually active. There is No history of pyelonephritis. Associated symptoms include frequency, hematuria, hesitancy and urgency. Pertinent negatives include no chills, discharge, nausea or vomiting.   Lab Results  Component Value Date   NA 140 09/10/2021   K 5.1 09/10/2021   CO2 22 09/10/2021   GLUCOSE 116 (H) 09/10/2021   BUN 17 09/10/2021   CREATININE 1.04 09/10/2021   CALCIUM 9.8 09/10/2021   EGFR 78 09/10/2021   GFRNONAA 70 09/25/2020   Lab Results  Component Value Date   CHOL 172 09/10/2021   HDL 41 09/10/2021   LDLCALC 113 (H) 09/10/2021   TRIG 96 09/10/2021   CHOLHDL 4.2 09/10/2021   Lab Results  Component Value Date   TSH 0.850 03/24/2019   Lab Results  Component Value Date   HGBA1C 6.4 (H) 09/10/2021   Lab Results  Component Value Date   WBC 7.9 03/26/2021   HGB 14.6 03/26/2021   HCT 44.5 03/26/2021   MCV 94 03/26/2021   PLT 298 03/26/2021   Lab Results  Component Value Date   ALT 32 09/10/2021   AST 22 09/10/2021   ALKPHOS 79 09/10/2021   BILITOT 0.3 09/10/2021   No results found for: 25OHVITD2, 25OHVITD3, VD25OH   Review of Systems  Constitutional:  Negative for chills, fatigue and fever.  Respiratory:  Negative for shortness of breath.   Cardiovascular:  Negative for chest pain.  Gastrointestinal:  Negative for abdominal distention, diarrhea, nausea and vomiting.  Genitourinary:  Positive for dysuria, frequency, hematuria, hesitancy and urgency.  Neurological:  Negative for dizziness and headaches.   Patient Active Problem List   Diagnosis Date Noted   Hx of hepatitis C 01/10/2020    Hyperlipidemia associated with type 2 diabetes mellitus ([place]) 03/24/2019   DM type 2, controlled, with complication ([place]) 01/75/1025   Essential hypertension 06/02/2018   Complex tear of medial meniscus of right knee as current injury 11/06/2016   Primary osteoarthritis of right knee 11/06/2016   Auditory impairment 11/04/2015   Benign prostatic hyperplasia with urinary obstruction 11/04/2015   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   Generalized psoriasis 11/04/2015   Periodic limb movement 11/04/2015    No Known Allergies  Past Surgical History:  Procedure Laterality Date   BLEPHAROPLASTY  2016   COLONOSCOPY WITH PROPOFOL N/A 05/01/2016   Procedure: COLONOSCOPY WITH PROPOFOL;  Surgeon: Robert Bellow, MD;  Location: [facility] ENDOSCOPY;  Service: Endoscopy;  Laterality: N/A;   KNEE ARTHROSCOPY Left 01/14/2019   Procedure: ARTHROSCOPY KNEE WITH DEBRIDEMENT AND PARTIAL MEDIAL MENISCECTOMY;  Surgeon: Corky Mull, MD;  Location: [facility] ORS;  Service: Orthopedics;  Laterality: Left;   KNEE ARTHROSCOPY WITH MEDIAL MENISECTOMY Right 11/27/2016   Procedure: KNEE ARTHROSCOPY WITH MEDIAL MENISECTOMY;  Surgeon: Corky Mull, MD;  Location: [facility];  Service: Orthopedics;  Laterality: Right;   SINUS SURGERY WITH INSTATRAK     VASECTOMY      Social History   Tobacco Use   Smoking status: Former    Types: Cigarettes    Quit date: 12/30/1985    Years  since quitting: 35.9   Smokeless tobacco: Never  Vaping Use   Vaping Use: Never used  Substance Use Topics   Alcohol use: Yes    Alcohol/week: 4.0 standard drinks    Types: 4 Standard drinks or equivalent per week    Comment:     Drug use: No     Medication list has been reviewed and updated.  Current Meds  Medication Sig   amLODipine (NORVASC) 5 MG tablet TAKE 1 TABLET(5 MG) BY MOUTH DAILY   atorvastatin (LIPITOR) 10 MG tablet TAKE 1 TABLET(10 MG) BY MOUTH DAILY   clobetasol (TEMOVATE) 0.05 % GEL Apply 1 application  topically 2 (two) times daily.   lisinopril (ZESTRIL) 10 MG tablet TAKE 1 TABLET(10 MG) BY MOUTH DAILY   meloxicam (MOBIC) 7.5 MG tablet TAKE 1 TABLET(7.5 MG) BY MOUTH DAILY   sulfamethoxazole-trimethoprim (BACTRIM DS) 800-160 MG tablet Take 1 tablet by mouth 2 (two) times daily for 10 days.   Tildrakizumab-asmn (ILUMYA [place]) Inject into the skin every 3 (three) months.    PHQ 2/9 Scores 12/11/2021 09/10/2021 03/26/2021 03/12/2021  PHQ - 2 Score 0 0 0 0  PHQ- 9 Score 0 1 0 -    GAD 7 : Generalized Anxiety Score 12/11/2021 09/10/2021 03/26/2021  Nervous, Anxious, on Edge 0 0 0  Control/stop worrying 0 0 0  Worry too much - different things 0 0 0  Trouble relaxing 0 0 0  Restless 0 0 0  Easily annoyed or irritable 0 0 0  Afraid - awful might happen 0 0 0  Total GAD 7 Score 0 0 0  Anxiety Difficulty Not difficult at all Not difficult at all Not difficult at all    BP Readings from Last 3 Encounters:  12/11/21 138/86  09/10/21 138/88  03/26/21 132/84    Physical Exam Vitals and nursing note reviewed.  Constitutional:      General: He is not in acute distress.    Appearance: Normal appearance. He is well-developed.  HENT:     Head: Normocephalic and atraumatic.  Cardiovascular:     Rate and Rhythm: Normal rate and regular rhythm.     Pulses: Normal pulses.  Pulmonary:     Effort: Pulmonary effort is normal. No respiratory distress.     Breath sounds: No wheezing or rhonchi.  Abdominal:     General: Bowel sounds are normal.     Palpations: Abdomen is soft.     Tenderness: There is no right CVA tenderness or left CVA tenderness.  Musculoskeletal:     Cervical back: Normal range of motion.     Right lower leg: No edema.     Left lower leg: No edema.  Skin:    General: Skin is warm and dry.     Findings: No rash.  Neurological:     Mental Status: He is alert and oriented to person, place, and time.  Psychiatric:        Mood and Affect: Mood normal.        Behavior: Behavior  normal.    Wt Readings from Last 3 Encounters:  12/11/21 (!) 312 lb (141.5 kg)  09/10/21 (!) 311 lb 3.2 oz (141.2 kg)  03/26/21 (!) 323 lb 12.8 oz (146.9 kg)    BP 138/86   Pulse 85   Temp 97.8 F (36.6 C) (Oral)   Ht _0  (1.93 m)   Wt (!) 312 lb (141.5 kg)   SpO2 98%   BMI 37.98 kg/m   Assessment  and Plan: 1. Gross hematuria Unable to give a urine specimen. Will treat presumptively for UTI/prostatitis Recheck in 10 days - sulfamethoxazole-trimethoprim (BACTRIM DS) 800-160 MG tablet; Take 1 tablet by mouth 2 (two) times daily for 10 days.  Dispense: 20 tablet; Refill: 0  2. Benign prostatic hyperplasia with urinary obstruction   Partially dictated using Editor, commissioning. Any errors are unintentional.  Halina Maidens, MD [facility]  12/11/2021

## 2021-12-12 ENCOUNTER — Ambulatory Visit: Payer: Self-pay

## 2021-12-12 NOTE — Telephone Encounter (Signed)
Pt called in stating he still has hematuria but is feeling better after starting the abx. He voided out what he thinks is a kidney stone and is just asking if Dr. Judithann Graves needs to see it or not. Pt states its about as big as 1/4 in and looked like it had a stem on it. He also says he's not urinating every 30 mins anymore. Called and spoke with Marylene Land, [facility] who spoke with Dr. Judithann Graves CMA. She said pt could discard the stone and as long as he's feeling better, continue taking the abx. Pt verbalized understanding.    Answer Assessment - Initial Assessment Questions 1. REASON FOR CALL or QUESTION: "What is your reason for calling today?" or "How can I best help you?" or "What question do you have that I can help answer?"     Pt voided out what he thinks is a kidney stone and is just asking if he needs to bring it in the office for Dr. Judithann Graves to look at.  Protocols used: Information Only Call - No Triage-A-AH

## 2021-12-13 ENCOUNTER — Ambulatory Visit: Payer: Self-pay | Admitting: *Deleted

## 2021-12-13 ENCOUNTER — Encounter: Payer: Self-pay | Admitting: Internal Medicine

## 2021-12-13 NOTE — Telephone Encounter (Signed)
C/o urgency urinating and frequency since passing kidney stone this week. Continues taking bactrim and requesting medication to control the urgency he has urinating . Reports "when it starts I cant stop it" when sensation noted patient has to urinate. Seen 12/11/21 and other symptoms better. Please advise if medication for urgency recommended. Requesting urology referral. Care advise given. Patient verbalized understanding of care advise .

## 2021-12-13 NOTE — Telephone Encounter (Signed)
Reason for Disposition  [1] Can't control passage of urine (i.e., urinary incontinence, wetting self) AND [2] present > 2 weeks  Answer Assessment - Initial Assessment Questions 1. SEVERITY: "How bad is the pain?"  (e.g., Scale 1-10; mild, moderate, or severe)   - MILD (1-3): complains slightly about urination hurting   - MODERATE (4-7): interferes with normal activities     - SEVERE (8-10): excruciating, unwilling or unable to urinate because of the pain      No pain urgency  2. FREQUENCY: "How many times have you had painful urination today?"      Na  3. PATTERN: "Is pain present every time you urinate or just sometimes?"      na 4. ONSET: "When did the painful urination start?"      na 5. FEVER: "Do you have a fever?" If Yes, ask: "What is your temperature, how was it measured, and when did it start?"     na 6. PAST UTI: "Have you had a urine infection before?" If Yes, ask: "When was the last time?" and "What happened that time?"      Yes  7. CAUSE: "What do you think is causing the painful urination?"      na 8. OTHER SYMPTOMS: "Do you have any other symptoms?" (e.g., flank pain, penile discharge, scrotal pain, blood in urine)     Urgency urinating and frequency s/p kidney stone on bactrim now  Answer Assessment - Initial Assessment Questions 1. SYMPTOM: "What's the main symptom you're concerned about?" (e.g., frequency, incontinence)     Urgency , incontinence  2. ONSET: "When did the  urgency urinating   start?"     Since passing kidney stone this week  3. PAIN: "Is there any pain?" If Yes, ask: "How bad is it?" (Scale: 1-10; mild, moderate, severe)     na 4. CAUSE: "What do you think is causing the symptoms?"     na 5. OTHER SYMPTOMS: "Do you have any other symptoms?" (e.g., fever, flank pain, blood in urine, pain with urination)     Urgency and frequency urinating  6. PREGNANCY: "Is there any chance you are pregnant?" "When was your last menstrual period?"      na  Protocols used: Urination Pain - Male-A-AH, Urinary Symptoms-A-AH

## 2021-12-13 NOTE — Telephone Encounter (Signed)
Please review.  KP

## 2021-12-14 ENCOUNTER — Other Ambulatory Visit: Payer: Self-pay

## 2021-12-14 DIAGNOSIS — R350 Frequency of micturition: Secondary | ICD-10-CM

## 2021-12-14 DIAGNOSIS — R310 Gross hematuria: Secondary | ICD-10-CM

## 2021-12-14 NOTE — Telephone Encounter (Signed)
Called pt let him know a referral for urology was placed. There is no medication for urgency as well as the medication could help if its a infection.  Pt verbalized understanding.   KP

## 2021-12-21 ENCOUNTER — Telehealth (INDEPENDENT_AMBULATORY_CARE_PROVIDER_SITE_OTHER): Payer: Medicare Other | Admitting: Internal Medicine

## 2021-12-21 ENCOUNTER — Other Ambulatory Visit: Payer: Self-pay

## 2021-12-21 ENCOUNTER — Encounter: Payer: Self-pay | Admitting: Internal Medicine

## 2021-12-21 ENCOUNTER — Other Ambulatory Visit: Payer: Self-pay | Admitting: Internal Medicine

## 2021-12-21 VITALS — Temp 98.7°F | Ht 76.0 in | Wt 315.0 lb

## 2021-12-21 DIAGNOSIS — U071 COVID-19: Secondary | ICD-10-CM | POA: Diagnosis not present

## 2021-12-21 DIAGNOSIS — R310 Gross hematuria: Secondary | ICD-10-CM | POA: Diagnosis not present

## 2021-12-21 MED ORDER — MOLNUPIRAVIR EUA 200MG CAPSULE: 4.0000 | ORAL_CAPSULE | Freq: Two times a day (BID) | ORAL | 0 refills | Status: DC

## 2021-12-21 MED ORDER — PROMETHAZINE-DM 6.25-15 MG/5ML PO SYRP
5.0000 mL | ORAL_SOLUTION | Freq: Four times a day (QID) | ORAL | 0 refills | Status: DC | PRN
Start: 2021-12-21 — End: 2021-12-21

## 2021-12-21 MED ORDER — MOLNUPIRAVIR EUA 200MG CAPSULE: 4.0000 | ORAL_CAPSULE | Freq: Two times a day (BID) | ORAL | 0 refills | Status: AC

## 2021-12-21 MED ORDER — PROMETHAZINE-DM 6.25-15 MG/5ML PO SYRP: 5.0000 mL | ORAL_SOLUTION | Freq: Four times a day (QID) | ORAL | 0 refills | Status: DC | PRN

## 2021-12-21 NOTE — Progress Notes (Signed)
Date:  12/21/2021   Name:  Benjamin Medina   DOB:  06/14/1951   MRN:  [id number]  This encounter was conducted via video encounter due to the need for social distancing in light of the Covid-19 pandemic.  The patient was correctly identified.  I advised that I am conducting the visit from a secure room in my office at [facility] clinic.  The patient is located at home. The limitations of this form of encounter were discussed with the patient and he/she agreed to proceed.  Some vital signs will be absent.  Chief Complaint: Covid Positive (Symptoms started yesterday. Dry Cough. No fever. Headache. Scratchy throat. )  Cough This is a new problem. The current episode started yesterday. The problem occurs hourly. The cough is Non-productive. Associated symptoms include chills, headaches, postnasal drip and a sore throat. Pertinent negatives include no fever, shortness of breath or wheezing.  Hematuria The problem has been resolved since onset. He describes the hematuria as gross hematuria. The hematuria occurs during the initial portion of his urinary stream. He reports no clotting in his urine stream. Irritative symptoms do not include frequency, nocturia or urgency. Associated symptoms include chills. Pertinent negatives include no fever, nausea or vomiting.   Lab Results  Component Value Date   NA 140 09/10/2021   K 5.1 09/10/2021   CO2 22 09/10/2021   GLUCOSE 116 (H) 09/10/2021   BUN 17 09/10/2021   CREATININE 1.04 09/10/2021   CALCIUM 9.8 09/10/2021   EGFR 78 09/10/2021   GFRNONAA 70 09/25/2020   Lab Results  Component Value Date   CHOL 172 09/10/2021   HDL 41 09/10/2021   LDLCALC 113 (H) 09/10/2021   TRIG 96 09/10/2021   CHOLHDL 4.2 09/10/2021   Lab Results  Component Value Date   TSH 0.850 03/24/2019   Lab Results  Component Value Date   HGBA1C 6.4 (H) 09/10/2021   Lab Results  Component Value Date   WBC 7.9 03/26/2021   HGB 14.6 03/26/2021   HCT 44.5  03/26/2021   MCV 94 03/26/2021   PLT 298 03/26/2021   Lab Results  Component Value Date   ALT 32 09/10/2021   AST 22 09/10/2021   ALKPHOS 79 09/10/2021   BILITOT 0.3 09/10/2021   No results found for: 25OHVITD2, 25OHVITD3, VD25OH   Review of Systems  Constitutional:  Positive for chills and fatigue. Negative for fever.  HENT:  Positive for congestion, postnasal drip and sore throat.   Respiratory:  Positive for cough. Negative for chest tightness, shortness of breath and wheezing.   Gastrointestinal:  Negative for diarrhea, nausea and vomiting.  Genitourinary:  Negative for frequency, hematuria, nocturia and urgency.  Neurological:  Positive for headaches. Negative for dizziness and light-headedness.   Patient Active Problem List   Diagnosis Date Noted   Hx of hepatitis C 01/10/2020   Hyperlipidemia associated with type 2 diabetes mellitus ([place]) 03/24/2019   DM type 2, controlled, with complication ([place]) 47/42/5956   Essential hypertension 06/02/2018   Complex tear of medial meniscus of right knee as current injury 11/06/2016   Primary osteoarthritis of right knee 11/06/2016   Auditory impairment 11/04/2015   Benign prostatic hyperplasia with urinary obstruction 11/04/2015   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   Generalized psoriasis 11/04/2015   Periodic limb movement 11/04/2015    No Known Allergies  Past Surgical History:  Procedure Laterality Date   BLEPHAROPLASTY  2016   COLONOSCOPY WITH PROPOFOL N/A 05/01/2016   Procedure:  COLONOSCOPY WITH PROPOFOL;  Surgeon: Robert Bellow, MD;  Location: [facility] ENDOSCOPY;  Service: Endoscopy;  Laterality: N/A;   KNEE ARTHROSCOPY Left 01/14/2019   Procedure: ARTHROSCOPY KNEE WITH DEBRIDEMENT AND PARTIAL MEDIAL MENISCECTOMY;  Surgeon: Corky Mull, MD;  Location: [facility] ORS;  Service: Orthopedics;  Laterality: Left;   KNEE ARTHROSCOPY WITH MEDIAL MENISECTOMY Right 11/27/2016   Procedure: KNEE ARTHROSCOPY WITH MEDIAL MENISECTOMY;   Surgeon: Corky Mull, MD;  Location: [place];  Service: Orthopedics;  Laterality: Right;   SINUS SURGERY WITH INSTATRAK     VASECTOMY      Social History   Tobacco Use   Smoking status: Former    Types: Cigarettes    Quit date: 12/30/1985    Years since quitting: 36.0   Smokeless tobacco: Never  Vaping Use   Vaping Use: Never used  Substance Use Topics   Alcohol use: Yes    Alcohol/week: 4.0 standard drinks    Types: 4 Standard drinks or equivalent per week    Comment:     Drug use: No     Medication list has been reviewed and updated.  Current Meds  Medication Sig   amLODipine (NORVASC) 5 MG tablet TAKE 1 TABLET(5 MG) BY MOUTH DAILY   atorvastatin (LIPITOR) 10 MG tablet TAKE 1 TABLET(10 MG) BY MOUTH DAILY   clobetasol (TEMOVATE) 0.05 % GEL Apply 1 application topically 2 (two) times daily.   lisinopril (ZESTRIL) 10 MG tablet TAKE 1 TABLET(10 MG) BY MOUTH DAILY   meloxicam (MOBIC) 7.5 MG tablet TAKE 1 TABLET(7.5 MG) BY MOUTH DAILY   Tildrakizumab-asmn (ILUMYA [place]) Inject into the skin every 3 (three) months.    PHQ 2/9 Scores 12/21/2021 12/11/2021 09/10/2021 03/26/2021  PHQ - 2 Score 0 0 0 0  PHQ- 9 Score 0 0 1 0    GAD 7 : Generalized Anxiety Score 12/21/2021 12/11/2021 09/10/2021 03/26/2021  Nervous, Anxious, on Edge 0 0 0 0  Control/stop worrying 0 0 0 0  Worry too much - different things 0 0 0 0  Trouble relaxing 0 0 0 0  Restless 0 0 0 0  Easily annoyed or irritable 0 0 0 0  Afraid - awful might happen 0 0 0 0  Total GAD 7 Score 0 0 0 0  Anxiety Difficulty Not difficult at all Not difficult at all Not difficult at all Not difficult at all    BP Readings from Last 3 Encounters:  12/11/21 138/86  09/10/21 138/88  03/26/21 132/84    Physical Exam Constitutional:      Appearance: He is ill-appearing.  Pulmonary:     Effort: Pulmonary effort is normal.  Neurological:     General: No focal deficit present.     Mental Status: He is alert.   Psychiatric:        Mood and Affect: Mood normal.        Behavior: Behavior normal.    Wt Readings from Last 3 Encounters:  12/21/21 (!) 315 lb (142.9 kg)  12/11/21 (!) 312 lb (141.5 kg)  09/10/21 (!) 311 lb 3.2 oz (141.2 kg)    Temp 98.7 F (37.1 C) (Oral)    Ht _0  (1.93 m)    Wt (!) 315 lb (142.9 kg)    BMI 38.34 kg/m   Assessment and Plan: 1. COVID-19 virus infection Rest, fluid, Tylenol Precautions for seeking in person care given - molnupiravir EUA (LAGEVRIO) 200 mg CAPS capsule; Take 4 capsules (800 mg total) by mouth  2 (two) times daily for 5 days.  Dispense: 40 capsule; Refill: 0 - promethazine-dextromethorphan (PROMETHAZINE-DM) 6.25-15 MG/5ML syrup; Take 5 mLs by mouth 4 (four) times daily as needed for cough.  Dispense: 180 mL; Refill: 0  2. Gross hematuria Now resolved, Antibiotics completed. See Urology as planned in early Jan.  I spent 11 minutes on this encounter. Partially dictated using Editor, commissioning. Any errors are unintentional.  Halina Maidens, MD [facility]  12/21/2021

## 2021-12-21 NOTE — Telephone Encounter (Signed)
Copied from CRM [id number]. Topic: Quick Communication - Rx Refill/Question >> Dec 21, 2021 11:04 AM Gaetana Michaelis A wrote: Medication: molnupiravir EUA (LAGEVRIO) 200 mg CAPS capsule [[id number]]   promethazine-dextromethorphan (PROMETHAZINE-DM) 6.25-15 MG/5ML syrup [[id number]]   Has the patient contacted their pharmacy? Yes.  The patient's wife has contacted their pharmacy and been told that they have closed due to a power outage  (Agent: If no, request that the patient contact the pharmacy for the refill. If patient does not wish to contact the pharmacy document the reason why and proceed with request.) (Agent: If yes, when and what did the pharmacy advise?)  Preferred Pharmacy (with phone number or street name): [id number] - [place], [place] - [address] [place] [address] AT [facility] [address]  Phone:  [phone] Fax:  [phone]  Has the patient been seen for an appointment in the last year OR does the patient have an upcoming appointment? Yes.    Agent: Please be advised that RX refills may take up to 3 business days. We ask that you follow-up with your pharmacy.

## 2021-12-21 NOTE — Telephone Encounter (Signed)
Requested medication (s) are due for refill today: yes  Requested medication (s) are on the active medication list: yes  Last refill:  na  Future visit scheduled: yes  Notes to clinic:  medication not assigned to a protocol, not delegated per protocol. Requesting Rx sent to different pharmacy due to power outage.      Requested Prescriptions  Pending Prescriptions Disp Refills   molnupiravir EUA (LAGEVRIO) 200 mg CAPS capsule 40 capsule 0    Sig: Take 4 capsules (800 mg total) by mouth 2 (two) times daily for 5 days.     There is no refill protocol information for this order     promethazine-dextromethorphan (PROMETHAZINE-DM) 6.25-15 MG/5ML syrup 180 mL 0    Sig: Take 5 mLs by mouth 4 (four) times daily as needed for cough.     Ear, Nose, and Throat:  Antitussives/Expectorants Passed - 12/21/2021 12:33 PM      Passed - Valid encounter within last 12 months    Recent Outpatient Visits           Today COVID-19 virus infection   [facility] Reubin Milan, MD   1 week ago Gross hematuria   [facility] Reubin Milan, MD   3 months ago Essential hypertension   [facility] Reubin Milan, MD   9 months ago Annual physical exam   [facility] Reubin Milan, MD   1 year ago Essential hypertension   [facility] Reubin Milan, MD       Future Appointments             In 1 week Richardo Hanks Laurette Schimke, MD [facility] [place]   In 3 months Reubin Milan, MD [facility], [facility]

## 2021-12-21 NOTE — Telephone Encounter (Signed)
Patient's wife notified that Dr. Judithann Graves is sending in the medication to the pharmacy requested.

## 2021-12-21 NOTE — Patient Instructions (Signed)
Take Tylenol 650 - 1000 mg every 6-8 hours for fever, body aches and headache. Drink plenty of fluids with electrolytes. Monitor for fever that does not decrease and/or shortness of breath that worsens or is present at rest. Quarantine for 5 days from test positivity

## 2021-12-21 NOTE — Telephone Encounter (Signed)
Patient called checking on the status of resending this medication to another pharmacy due to power outage at the original pharmacy.

## 2021-12-28 ENCOUNTER — Ambulatory Visit: Payer: Medicare Other | Admitting: Urology

## 2022-01-01 ENCOUNTER — Ambulatory Visit: Payer: Medicare Other | Admitting: Urology

## 2022-01-01 ENCOUNTER — Other Ambulatory Visit: Payer: Self-pay

## 2022-01-01 ENCOUNTER — Other Ambulatory Visit
Admission: RE | Admit: 2022-01-01 | Discharge: 2022-01-01 | Disposition: A | Discharge status: Home / Self Care | Payer: Medicare Other | Attending: Urology | Admitting: Urology

## 2022-01-01 ENCOUNTER — Encounter: Payer: Self-pay | Admitting: Urology

## 2022-01-01 VITALS — BP 143/84 | HR 74 | Ht 75.0 in | Wt 312.0 lb

## 2022-01-01 DIAGNOSIS — N138 Other obstructive and reflux uropathy: Secondary | ICD-10-CM | POA: Diagnosis not present

## 2022-01-01 DIAGNOSIS — N200 Calculus of kidney: Secondary | ICD-10-CM

## 2022-01-01 DIAGNOSIS — R310 Gross hematuria: Secondary | ICD-10-CM

## 2022-01-01 DIAGNOSIS — N401 Enlarged prostate with lower urinary tract symptoms: Secondary | ICD-10-CM

## 2022-01-01 LAB — URINALYSIS, COMPLETE (UACMP) WITH MICROSCOPIC
Bacteria, UA: NONE SEEN
Bilirubin Urine: NEGATIVE
Glucose, UA: NEGATIVE mg/dL
Hgb urine dipstick: NEGATIVE
Ketones, ur: NEGATIVE mg/dL
Leukocytes,Ua: NEGATIVE
Nitrite: NEGATIVE
Protein, ur: NEGATIVE mg/dL
Specific Gravity, Urine: 1.025 (ref 1.005–1.030)
pH: 5.5 (ref 5.0–8.0)

## 2022-01-01 NOTE — Progress Notes (Signed)
01/01/22 2:44 PM   De Nurse 07-19-1951 [id number]  CC: Gross hematuria, pelvic pain, possible kidney stone  HPI: 71 year old male who reports episode of significant painless gross hematuria the week before Christmas, that ultimately developed into passing small clots and lower abdominal pain.  He thinks he passed a small stone at that time.  He was also having some urinary symptoms of urgency and frequency at that time.  He was seen by his PCP who prescribed a 10-day course of Bactrim, but urinalysis was not checked at that time.  He reports that after passing the stone and taking the Bactrim his symptoms resolved and he really denies any urinary complaints today.  PVR today is mildly elevated at 265 mL but he voided about half an hour ago.  Notably, he reports a similar episode of gross hematuria with pain that lasted only about a day a few months ago.  He did not seek medical care at that time.  Urinalysis today is completely benign with no bacteria, 0-5 RBCs, 0-5 WBCs, no squamous cells, no leukocytes, nitrite negative.  No prior imaging to review  PSA March 2022 normal at 1.6.  PMH: Past Medical History:  Diagnosis Date   Arthritis    Auditory impairment    BPH with urinary obstruction    Contracture of hand    PALMAR FASCIA   Diabetes mellitus without complication ([place])    Hyperlipidemia    Hypertension    Psoriasis     Surgical History: Past Surgical History:  Procedure Laterality Date   BLEPHAROPLASTY  2016   COLONOSCOPY WITH PROPOFOL N/A 05/01/2016   Procedure: COLONOSCOPY WITH PROPOFOL;  Surgeon: Robert Bellow, MD;  Location: [facility];  Service: Endoscopy;  Laterality: N/A;   KNEE ARTHROSCOPY Left 01/14/2019   Procedure: ARTHROSCOPY KNEE WITH DEBRIDEMENT AND PARTIAL MEDIAL MENISCECTOMY;  Surgeon: Corky Mull, MD;  Location: [facility] ORS;  Service: Orthopedics;  Laterality: Left;   KNEE ARTHROSCOPY WITH MEDIAL MENISECTOMY Right 11/27/2016   Procedure:  KNEE ARTHROSCOPY WITH MEDIAL MENISECTOMY;  Surgeon: Corky Mull, MD;  Location: [place];  Service: Orthopedics;  Laterality: Right;   SINUS SURGERY WITH INSTATRAK     VASECTOMY     Family History: Family History  Problem Relation Age of Onset   Colon cancer Mother 108   Lymphoma Father     Social History:  reports that he quit smoking about 36 years ago. His smoking use included cigarettes. He has never used smokeless tobacco. He reports current alcohol use of about 4.0 standard drinks per week. He reports that he does not use drugs.  Physical Exam: BP (!) 143/84    Pulse 74    Ht 6\' 3"  (1.905 m)    Wt (!) 312 lb (141.5 kg)    BMI 39.00 kg/m    Constitutional:  Alert and oriented, No acute distress. Cardiovascular: No clubbing, cyanosis, or edema. Respiratory: Normal respiratory effort, no increased work of breathing. GI: Abdomen is soft, nontender, nondistended, no abdominal masses   Laboratory Data: Reviewed, see HPI  Pertinent Imaging: None to review  Assessment & Plan:   71 year old male with around 1 week of gross hematuria associated with lower abdominal pain, urgency/frequency, that resolved after passing what he thought was a kidney stone, as well as a 10-day course of antibiotics from PCP. Urinalysis today is completely benign.  We discussed possible etiologies including BPH, UTI, prostatitis, nephrolithiasis, or more worrisome causes like bladder or kidney tumors.  I  recommended starting with a CT urogram, and considering cystoscopy in the future.  I also want to see him back to review the CT scan results and repeat a bladder scan to confirm he is emptying well.  RTC 1 month for repeat UA, PVR, discuss CT results, consider cystoscopy   Benjamin Madrid, MD 01/01/2022  [facility] [address] [place], [place] [address] [phone]

## 2022-01-08 DIAGNOSIS — Z9889 Other specified postprocedural states: Secondary | ICD-10-CM | POA: Diagnosis not present

## 2022-01-08 DIAGNOSIS — Z7689 Persons encountering health services in other specified circumstances: Secondary | ICD-10-CM | POA: Diagnosis not present

## 2022-01-08 DIAGNOSIS — Z79899 Other long term (current) drug therapy: Secondary | ICD-10-CM | POA: Diagnosis not present

## 2022-01-08 DIAGNOSIS — L400 Psoriasis vulgaris: Secondary | ICD-10-CM | POA: Diagnosis not present

## 2022-01-09 DIAGNOSIS — M1711 Unilateral primary osteoarthritis, right knee: Secondary | ICD-10-CM | POA: Diagnosis not present

## 2022-01-09 DIAGNOSIS — E118 Type 2 diabetes mellitus with unspecified complications: Secondary | ICD-10-CM | POA: Diagnosis not present

## 2022-01-22 ENCOUNTER — Ambulatory Visit: Admission: RE | Admit: 2022-01-22 | Payer: Medicare Other | Source: Ambulatory Visit

## 2022-01-24 DIAGNOSIS — Z7689 Persons encountering health services in other specified circumstances: Secondary | ICD-10-CM | POA: Diagnosis not present

## 2022-01-29 ENCOUNTER — Ambulatory Visit: Payer: Medicare Other | Admitting: Urology

## 2022-01-31 ENCOUNTER — Telehealth: Payer: Self-pay | Admitting: Internal Medicine

## 2022-01-31 ENCOUNTER — Ambulatory Visit: Payer: Medicare Other | Admitting: Urology

## 2022-01-31 NOTE — Telephone Encounter (Signed)
Copied from CRM [id number]. Topic: General - Other >> Jan 31, 2022  4:33 PM Marylen Ponto wrote: Reason for CRM: [facility] requests most recent CBC labs be sent to them. Fax# [phone]

## 2022-01-31 NOTE — Telephone Encounter (Signed)
Labs printed and faxed.  KP 

## 2022-02-01 ENCOUNTER — Encounter: Payer: Self-pay | Admitting: Urology

## 2022-03-13 ENCOUNTER — Ambulatory Visit (INDEPENDENT_AMBULATORY_CARE_PROVIDER_SITE_OTHER): Payer: Medicare Other

## 2022-03-13 DIAGNOSIS — Z0000 Encounter for general adult medical examination without abnormal findings: Secondary | ICD-10-CM | POA: Diagnosis not present

## 2022-03-13 NOTE — Patient Instructions (Signed)
[name] , ?Thank you for taking time to come for your Medicare Wellness Visit. I appreciate your ongoing commitment to your health goals. Please review the following plan we discussed and let me know if I can assist you in the future.  ? ?Screening recommendations/referrals: ?Colonoscopy: done 05/01/16. Repeat 04/2026 ?Recommended yearly ophthalmology/optometry visit for glaucoma screening and checkup ?Recommended yearly dental visit for hygiene and checkup ? ?Vaccinations: ?Influenza vaccine: done 09/27/21 ?Pneumococcal vaccine: done 09/25/20 ?Tdap vaccine: due ?Shingles vaccine: Shingrix discussed. Please contact your pharmacy for coverage information.  ?Covid-19: done 02/13/20, 03/07/20, 11/29/20 & 04/19/21 ? ?Advanced directives: Advance directive discussed with you today. I have provided a copy for you to complete at home and have notarized. Once this is complete please bring a copy in to our office so we can scan it into your chart.  ? ?Conditions/risks identified: Keep up the great work! ? ?Next appointment: Follow up in one year for your annual wellness visit.  ? ?Preventive Care 42 Years and Older, Male ?Preventive care refers to lifestyle choices and visits with your health care provider that can promote health and wellness. ?What does preventive care include? ?A yearly physical exam. This is also called an annual well check. ?Dental exams once or twice a year. ?Routine eye exams. Ask your health care provider how often you should have your eyes checked. ?Personal lifestyle choices, including: ?Daily care of your teeth and gums. ?Regular physical activity. ?Eating a healthy diet. ?Avoiding tobacco and drug use. ?Limiting alcohol use. ?Practicing safe sex. ?Taking low doses of aspirin every day. ?Taking vitamin and mineral supplements as recommended by your health care provider. ?What happens during an annual well check? ?The services and screenings done by your health care provider during your annual well check  will depend on your age, overall health, lifestyle risk factors, and family history of disease. ?Counseling  ?Your health care provider may ask you questions about your: ?Alcohol use. ?Tobacco use. ?Drug use. ?Emotional well-being. ?Home and relationship well-being. ?Sexual activity. ?Eating habits. ?History of falls. ?Memory and ability to understand (cognition). ?Work and work Astronomer. ?Screening  ?You may have the following tests or measurements: ?Height, weight, and BMI. ?Blood pressure. ?Lipid and cholesterol levels. These may be checked every 5 years, or more frequently if you are over 19 years old. ?Skin check. ?Lung cancer screening. You may have this screening every year starting at age 63 if you have a 30-pack-year history of smoking and currently smoke or have quit within the past 15 years. ?Fecal occult blood test (FOBT) of the stool. You may have this test every year starting at age 59. ?Flexible sigmoidoscopy or colonoscopy. You may have a sigmoidoscopy every 5 years or a colonoscopy every 10 years starting at age 70. ?Prostate cancer screening. Recommendations will vary depending on your family history and other risks. ?Hepatitis C blood test. ?Hepatitis B blood test. ?Sexually transmitted disease (STD) testing. ?Diabetes screening. This is done by checking your blood sugar (glucose) after you have not eaten for a while (fasting). You may have this done every 1-3 years. ?Abdominal aortic aneurysm (AAA) screening. You may need this if you are a current or former smoker. ?Osteoporosis. You may be screened starting at age 83 if you are at high risk. ?Talk with your health care provider about your test results, treatment options, and if necessary, the need for more tests. ?Vaccines  ?Your health care provider may recommend certain vaccines, such as: ?Influenza vaccine. This is recommended every year. ?  Tetanus, diphtheria, and acellular pertussis (Tdap, Td) vaccine. You may need a Td booster every 10  years. ?Zoster vaccine. You may need this after age 31. ?Pneumococcal 13-valent conjugate (PCV13) vaccine. One dose is recommended after age 43. ?Pneumococcal polysaccharide (PPSV23) vaccine. One dose is recommended after age 70. ?Talk to your health care provider about which screenings and vaccines you need and how often you need them. ?This information is not intended to replace advice given to you by your health care provider. Make sure you discuss any questions you have with your health care provider. ?Document Released: 01/12/2016 Document Revised: 09/04/2016 Document Reviewed: 10/17/2015 ?Elsevier Interactive Patient Education ? 2017 [place]. ? ?Fall Prevention in the Home ?Falls can cause injuries. They can happen to people of all ages. There are many things you can do to make your home safe and to help prevent falls. ?What can I do on the outside of my home? ?Regularly fix the edges of walkways and driveways and fix any cracks. ?Remove anything that might make you trip as you walk through a door, such as a raised step or threshold. ?Trim any bushes or trees on the path to your home. ?Use bright outdoor lighting. ?Clear any walking paths of anything that might make someone trip, such as rocks or tools. ?Regularly check to see if handrails are loose or broken. Make sure that both sides of any steps have handrails. ?Any raised decks and porches should have guardrails on the edges. ?Have any leaves, snow, or ice cleared regularly. ?Use sand or salt on walking paths during winter. ?Clean up any spills in your garage right away. This includes oil or grease spills. ?What can I do in the bathroom? ?Use night lights. ?Install grab bars by the toilet and in the tub and shower. Do not use towel bars as grab bars. ?Use non-skid mats or decals in the tub or shower. ?If you need to sit down in the shower, use a plastic, non-slip stool. ?Keep the floor dry. Clean up any water that spills on the floor as soon as it  happens. ?Remove soap buildup in the tub or shower regularly. ?Attach bath mats securely with double-sided non-slip rug tape. ?Do not have throw rugs and other things on the floor that can make you trip. ?What can I do in the bedroom? ?Use night lights. ?Make sure that you have a light by your bed that is easy to reach. ?Do not use any sheets or blankets that are too big for your bed. They should not hang down onto the floor. ?Have a firm chair that has side arms. You can use this for support while you get dressed. ?Do not have throw rugs and other things on the floor that can make you trip. ?What can I do in the kitchen? ?Clean up any spills right away. ?Avoid walking on wet floors. ?Keep items that you use a lot in easy-to-reach places. ?If you need to reach something above you, use a strong step stool that has a grab bar. ?Keep electrical cords out of the way. ?Do not use floor polish or wax that makes floors slippery. If you must use wax, use non-skid floor wax. ?Do not have throw rugs and other things on the floor that can make you trip. ?What can I do with my stairs? ?Do not leave any items on the stairs. ?Make sure that there are handrails on both sides of the stairs and use them. Fix handrails that are broken or loose.  Make sure that handrails are as long as the stairways. ?Check any carpeting to make sure that it is firmly attached to the stairs. Fix any carpet that is loose or worn. ?Avoid having throw rugs at the top or bottom of the stairs. If you do have throw rugs, attach them to the floor with carpet tape. ?Make sure that you have a light switch at the top of the stairs and the bottom of the stairs. If you do not have them, ask someone to add them for you. ?What else can I do to help prevent falls? ?Wear shoes that: ?Do not have high heels. ?Have rubber bottoms. ?Are comfortable and fit you well. ?Are closed at the toe. Do not wear sandals. ?If you use a stepladder: ?Make sure that it is fully opened.  Do not climb a closed stepladder. ?Make sure that both sides of the stepladder are locked into place. ?Ask someone to hold it for you, if possible. ?Clearly mark and make sure that you can see: ?Any grab

## 2022-03-13 NOTE — Progress Notes (Signed)
? ?Subjective:  ? Benjamin Medina is a 71 y.o. male who presents for Medicare Annual/Subsequent preventive examination. ? ?Virtual Visit via Telephone Note ? ?I connected with  De Nurse on 03/13/22 at 10:40 AM EDT by telephone and verified that I am speaking with the correct person using two identifiers. ? ?Location: ?Patient: home ?Provider: [facility] ?Persons participating in the virtual visit: patient/Nurse Health Advisor ?  ?I discussed the limitations, risks, security and privacy concerns of performing an evaluation and management service by telephone and the availability of in person appointments. The patient expressed understanding and agreed to proceed. ? ?Interactive audio and video telecommunications were attempted between this nurse and patient, however failed, due to patient having technical difficulties OR patient did not have access to video capability.  We continued and completed visit with audio only. ? ?Some vital signs may be absent or patient reported.  ? ?Clemetine Marker, LPN ? ? ?Review of Systems    ? ?Cardiac Risk Factors include: advanced age (>66men, >87 women);diabetes mellitus;male gender;hypertension;dyslipidemia;obesity (BMI >30kg/m2) ? ?   ?Objective:  ?  ?There were no vitals filed for this visit. ?There is no height or weight on file to calculate BMI. ? ?Advanced Directives 03/13/2022 03/12/2021 01/14/2019 11/27/2016 06/12/2016  ?Does Patient Have a Medical Advance Directive? No (No Data) No No Yes  ?Type of Advance Directive - - - - Press photographer;Living will  ?Would patient like information on creating a medical advance directive? Yes (MAU/Ambulatory/Procedural Areas - Information given) - No - Patient declined No - Patient declined -  ? ? ?Current Medications (verified) ?Outpatient Encounter Medications as of 03/13/2022  ?Medication Sig  ? amLODipine (NORVASC) 5 MG tablet TAKE 1 TABLET(5 MG) BY MOUTH DAILY  ? atorvastatin (LIPITOR) 10 MG tablet TAKE 1 TABLET(10 MG) BY  MOUTH DAILY  ? clobetasol (TEMOVATE) 0.05 % GEL Apply 1 application topically 2 (two) times daily.  ? lisinopril (ZESTRIL) 10 MG tablet TAKE 1 TABLET(10 MG) BY MOUTH DAILY  ? meloxicam (MOBIC) 7.5 MG tablet TAKE 1 TABLET(7.5 MG) BY MOUTH DAILY  ? Tildrakizumab-asmn (ILUMYA [place]) Inject into the skin every 3 (three) months.  ? triamcinolone (KENALOG) 0.025 % cream Apply topically 2 (two) times daily.  ? [DISCONTINUED] promethazine-dextromethorphan (PROMETHAZINE-DM) 6.25-15 MG/5ML syrup Take 5 mLs by mouth 4 (four) times daily as needed for cough.  ? ?No facility-administered encounter medications on file as of 03/13/2022.  ? ? ?Allergies (verified) ?Patient has no known allergies.  ? ?History: ?Past Medical History:  ?Diagnosis Date  ? Arthritis   ? Auditory impairment   ? BPH with urinary obstruction   ? Contracture of hand   ? PALMAR FASCIA  ? Diabetes mellitus without complication ([place])   ? Hyperlipidemia   ? Hypertension   ? Psoriasis   ? ?Past Surgical History:  ?Procedure Laterality Date  ? BLEPHAROPLASTY  2016  ? COLONOSCOPY WITH PROPOFOL N/A 05/01/2016  ? Procedure: COLONOSCOPY WITH PROPOFOL;  Surgeon: Robert Bellow, MD;  [facility];  Service: Endoscopy;  Laterality: N/A;  ? KNEE ARTHROSCOPY Left 01/14/2019  ? Procedure: ARTHROSCOPY KNEE WITH DEBRIDEMENT AND PARTIAL MEDIAL MENISCECTOMY;  Surgeon: Corky Mull, MD;  Location: [facility] ORS;  Service: Orthopedics;  Laterality: Left;  ? KNEE ARTHROSCOPY WITH MEDIAL MENISECTOMY Right 11/27/2016  ? Procedure: KNEE ARTHROSCOPY WITH MEDIAL MENISECTOMY;  Surgeon: Corky Mull, MD;  Location: [place];  Service: Orthopedics;  Laterality: Right;  ? SINUS SURGERY WITH INSTATRAK    ?  VASECTOMY    ? ?Family History  ?Problem Relation Age of Onset  ? Colon cancer Mother 12  ? Lymphoma Father   ? ?Social History  ? ?Socioeconomic History  ? Marital status: Married  ?  Spouse name: Not on file  ? Number of children: 1  ? Years of education: Not on file  ?  Highest education level: High school graduate  ?Occupational History  ? Not on file  ?Tobacco Use  ? Smoking status: Former  ?  Types: Cigarettes  ?  Quit date: 12/30/1985  ?  Years since quitting: 36.2  ? Smokeless tobacco: Never  ?Vaping Use  ? Vaping Use: Never used  ?Substance and Sexual Activity  ? Alcohol use: Yes  ?  Alcohol/week: 4.0 standard drinks  ?  Types: 4 Standard drinks or equivalent per week  ?  Comment:    ? Drug use: No  ? Sexual activity: Yes  ?  Birth control/protection: None  ?Other Topics Concern  ? Not on file  ?Social History Narrative  ? Works part time as Administrator  ? ?Social Determinants of Health  ? ?Financial Resource Strain: Low Risk   ? Difficulty of Paying Living Expenses: Not hard at all  ?Food Insecurity: No Food Insecurity  ? Worried About Charity fundraiser in the Last Year: Never true  ? Ran Out of Food in the Last Year: Never true  ?Transportation Needs: No Transportation Needs  ? Lack of Transportation (Medical): No  ? Lack of Transportation (Non-Medical): No  ?Physical Activity: Insufficiently Active  ? Days of Exercise per Week: 3 days  ? Minutes of Exercise per Session: 30 min  ?Stress: No Stress Concern Present  ? Feeling of Stress : Only a little  ?Social Connections: Moderately Isolated  ? Frequency of Communication with Friends and Family: More than three times a week  ? Frequency of Social Gatherings with Friends and Family: Twice a week  ? Attends Religious Services: Never  ? Active Member of Clubs or Organizations: No  ? Attends Archivist Meetings: Never  ? Marital Status: Married  ? ? ?Tobacco Counseling ?Counseling given: Not Answered ? ? ?Clinical Intake: ? ?Pre-visit preparation completed: Yes ? ?Pain : No/denies pain ? ?  ? ?Nutritional Risks: None ?Diabetes: Yes ?CBG done?: No ?Did pt. bring in CBG monitor from home?: No ? ?How often do you need to have someone help you when you read instructions, pamphlets, or other written materials from your  doctor or pharmacy?: 1 - Never ? ?Nutrition Risk Assessment: ? ?Has the patient had any N/V/D within the last 2 months?  No  ?Does the patient have any non-healing wounds?  No  ?Has the patient had any unintentional weight loss or weight gain?  No  ? ?Diabetes: ? ?Is the patient diabetic?  Yes  ?If diabetic, was a CBG obtained today?  No  ?Did the patient bring in their glucometer from home?  No  ?How often do you monitor your CBG's? Pt does not actively check blood sugar.  ? ?Financial Strains and Diabetes Management: ? ?Are you having any financial strains with the device, your supplies or your medication? No .  ?Does the patient want to be seen by Chronic Care Management for management of their diabetes?  No  ?Would the patient like to be referred to a Nutritionist or for Diabetic Management?  No  ? ?Diabetic Exams: ? ?Diabetic Eye Exam: Completed 12/03/2021.  ? ?Diabetic Foot Exam:  Completed 03/26/21.  ? ?Interpreter Needed?: No ? ?Information entered by :: Clemetine Marker LPN ? ? ?Activities of Daily Living ?In your present state of health, do you have any difficulty performing the following activities: 03/13/2022 12/21/2021  ?Hearing? Y N  ?Vision? N N  ?Difficulty concentrating or making decisions? N N  ?Walking or climbing stairs? N N  ?Dressing or bathing? N N  ?Doing errands, shopping? N N  ?Preparing Food and eating ? N -  ?Using the Toilet? N -  ?In the past six months, have you accidently leaked urine? N -  ?Do you have problems with loss of bowel control? N -  ?Managing your Medications? N -  ?Managing your Finances? N -  ?Housekeeping or managing your Housekeeping? N -  ?Some recent data might be hidden  ? ? ?Patient Care Team: ?Glean Hess, MD as PCP - General (Internal Medicine) ?[facility]) ?Ree Edman, MD (Dermatology) ?Poggi, Marshall Cork, MD as Consulting Physician (Orthopedic Surgery) ?Billey Co, MD as Consulting Physician (Urology) ? ?Indicate any recent Medical  Services you may have received from other than [facility] providers in the past year (date may be approximate). ? ?   ?Assessment:  ? This is a routine wellness examination for Avyay. ? ?Hearing/Vision screen ?[place]

## 2022-03-24 ENCOUNTER — Other Ambulatory Visit: Payer: Self-pay | Admitting: Internal Medicine

## 2022-03-24 DIAGNOSIS — E1169 Type 2 diabetes mellitus with other specified complication: Secondary | ICD-10-CM

## 2022-03-26 NOTE — Telephone Encounter (Signed)
Requested Prescriptions  ?Pending Prescriptions Disp Refills  ?? atorvastatin (LIPITOR) 10 MG tablet [Pharmacy Med Name: ATORVASTATIN 10MG  TABLETS] 90 tablet 0  ?  Sig: TAKE 1 TABLET(10 MG) BY MOUTH DAILY  ?  ? Cardiovascular:  Antilipid - Statins Failed - 03/24/2022 11:15 AM  ?  ?  Failed - Lipid Panel in normal range within the last 12 months  ?  Cholesterol, Total  ?Date Value Ref Range Status  ?09/10/2021 172 100 - 199 mg/dL Final  ? ?LDL Chol Calc (NIH)  ?Date Value Ref Range Status  ?09/10/2021 113 (H) 0 - 99 mg/dL Final  ? ?HDL  ?Date Value Ref Range Status  ?09/10/2021 41 >39 mg/dL Final  ? ?Triglycerides  ?Date Value Ref Range Status  ?09/10/2021 96 0 - 149 mg/dL Final  ? ?  ?  ?  Passed - Patient is not pregnant  ?  ?  Passed - Valid encounter within last 12 months  ?  Recent Outpatient Visits   ?      ? 3 months ago COVID-19 virus infection  ? [facility], MD  ? 3 months ago Gross hematuria  ? [facility], MD  ? 6 months ago Essential hypertension  ? [facility], MD  ? 1 year ago Annual physical exam  ? [facility], MD  ? 1 year ago Essential hypertension  ? [facility], MD  ?  ?  ?Future Appointments   ?        ? In 2 days Reubin Milan, MD [facility]  ?  ? ?  ?  ?  ? ? ?

## 2022-03-28 ENCOUNTER — Encounter: Payer: Self-pay | Admitting: Internal Medicine

## 2022-03-28 ENCOUNTER — Ambulatory Visit (INDEPENDENT_AMBULATORY_CARE_PROVIDER_SITE_OTHER): Payer: Medicare Other | Admitting: Internal Medicine

## 2022-03-28 VITALS — BP 142/86 | HR 66 | Ht 76.0 in | Wt 315.0 lb

## 2022-03-28 DIAGNOSIS — Z0000 Encounter for general adult medical examination without abnormal findings: Secondary | ICD-10-CM | POA: Diagnosis not present

## 2022-03-28 DIAGNOSIS — E1169 Type 2 diabetes mellitus with other specified complication: Secondary | ICD-10-CM

## 2022-03-28 DIAGNOSIS — E785 Hyperlipidemia, unspecified: Secondary | ICD-10-CM | POA: Diagnosis not present

## 2022-03-28 DIAGNOSIS — Z1211 Encounter for screening for malignant neoplasm of colon: Secondary | ICD-10-CM | POA: Diagnosis not present

## 2022-03-28 DIAGNOSIS — E118 Type 2 diabetes mellitus with unspecified complications: Secondary | ICD-10-CM

## 2022-03-28 DIAGNOSIS — N138 Other obstructive and reflux uropathy: Secondary | ICD-10-CM

## 2022-03-28 DIAGNOSIS — M1711 Unilateral primary osteoarthritis, right knee: Secondary | ICD-10-CM

## 2022-03-28 DIAGNOSIS — N401 Enlarged prostate with lower urinary tract symptoms: Secondary | ICD-10-CM | POA: Diagnosis not present

## 2022-03-28 DIAGNOSIS — I10 Essential (primary) hypertension: Secondary | ICD-10-CM

## 2022-03-28 LAB — POCT URINALYSIS DIPSTICK
Bilirubin, UA: NEGATIVE
Blood, UA: NEGATIVE
Glucose, UA: NEGATIVE
Ketones, UA: NEGATIVE
Leukocytes, UA: NEGATIVE
Nitrite, UA: NEGATIVE
Protein, UA: NEGATIVE
Spec Grav, UA: 1.025 (ref 1.010–1.025)
Urobilinogen, UA: 0.2 E.U./dL
pH, UA: 6 (ref 5.0–8.0)

## 2022-03-28 MED ORDER — LISINOPRIL 10 MG PO TABS: ORAL_TABLET | ORAL | 3 refills | Status: DC

## 2022-03-28 MED ORDER — LISINOPRIL 20 MG PO TABS: ORAL_TABLET | ORAL | 1 refills | Status: DC

## 2022-03-28 MED ORDER — MELOXICAM 7.5 MG PO TABS: ORAL_TABLET | ORAL | 1 refills | Status: DC

## 2022-03-28 MED ORDER — AMLODIPINE BESYLATE 5 MG PO TABS: ORAL_TABLET | ORAL | 3 refills | Status: DC

## 2022-03-28 NOTE — Progress Notes (Signed)
? ? ?Date:  03/28/2022  ? ?Name:  Benjamin Medina   DOB:  1951-02-25   MRN:  [id number] ? ? ?Chief Complaint: Annual Exam ?Benjamin Medina is a 71 y.o. male who presents today for his Complete Annual Exam. He feels well. He reports exercising walking 3-4 days a week. He reports he is sleeping well. Breast complaints none. ? ?Colonoscopy: 04/2016 ? ?Health Maintenance Due  ?Topic Date Due  ? Zoster Vaccines- Shingrix (1 of 2) Never done  ? TETANUS/TDAP  05/07/2021  ? COVID-19 Vaccine (5 - Booster for Pfizer series) 06/14/2021  ? HEMOGLOBIN A1C  03/10/2022  ? FOOT EXAM  03/26/2022  ?  ?Immunization History  ?Administered Date(s) Administered  ? Fluad Quad(high Dose 65+) 09/25/2020  ? Influenza,inj,Quad PF,6+ Mos 12/10/2015, 10/15/2018, 10/09/2019  ? Influenza-Unspecified 09/27/2021  ? Moderna Sars-Covid-2 Vaccination 11/29/2020, 04/19/2021  ? PFIZER(Purple Top)SARS-COV-2 Vaccination 02/13/2020, 03/07/2020  ? Pneumococcal Conjugate-13 10/15/2018  ? Pneumococcal Polysaccharide-23 09/25/2020  ? Tdap 05/08/2011  ? Zoster, Live 05/08/2011  ? ? ?Hypertension ?This is a chronic problem. The problem is controlled. Pertinent negatives include no chest pain, headaches, palpitations or shortness of breath. Past treatments include ACE inhibitors and calcium channel blockers. The current treatment provides significant improvement. There is no history of kidney disease, CAD/MI or CVA.  ?Diabetes ?He presents for his follow-up diabetic visit. He has type 2 diabetes mellitus. Pertinent negatives for hypoglycemia include no dizziness, headaches or nervousness/anxiousness. Pertinent negatives for diabetes include no chest pain and no fatigue. Pertinent negatives for diabetic complications include no CVA. Current diabetic treatment includes diet. His weight is stable.  ?Hyperlipidemia ?This is a chronic problem. The problem is controlled. Pertinent negatives include no chest pain, myalgias or shortness of breath. Current  antihyperlipidemic treatment includes statins.  ?Hematuria - seen by Urology and had resolved by then.  Probably a stone was passed.  He was supposed to have a CT but he cancelled.  He feels well and has not had further problems. ? ?Lab Results  ?Component Value Date  ? NA 140 09/10/2021  ? K 5.1 09/10/2021  ? CO2 22 09/10/2021  ? GLUCOSE 116 (H) 09/10/2021  ? BUN 17 09/10/2021  ? CREATININE 1.04 09/10/2021  ? CALCIUM 9.8 09/10/2021  ? EGFR 78 09/10/2021  ? GFRNONAA 70 09/25/2020  ? ?Lab Results  ?Component Value Date  ? CHOL 172 09/10/2021  ? HDL 41 09/10/2021  ? LDLCALC 113 (H) 09/10/2021  ? TRIG 96 09/10/2021  ? CHOLHDL 4.2 09/10/2021  ? ?Lab Results  ?Component Value Date  ? TSH 0.850 03/24/2019  ? ?Lab Results  ?Component Value Date  ? HGBA1C 6.4 (H) 09/10/2021  ? ?Lab Results  ?Component Value Date  ? WBC 7.9 03/26/2021  ? HGB 14.6 03/26/2021  ? HCT 44.5 03/26/2021  ? MCV 94 03/26/2021  ? PLT 298 03/26/2021  ? ?Lab Results  ?Component Value Date  ? ALT 32 09/10/2021  ? AST 22 09/10/2021  ? ALKPHOS 79 09/10/2021  ? BILITOT 0.3 09/10/2021  ? ?No results found for: 25OHVITD2, [place], VD25OH  ? ?Review of Systems  ?Constitutional:  Negative for appetite change, chills, diaphoresis, fatigue and unexpected weight change.  ?HENT:  Negative for hearing loss, tinnitus, trouble swallowing and voice change.   ?Eyes:  Negative for visual disturbance.  ?Respiratory:  Negative for choking, shortness of breath and wheezing.   ?Cardiovascular:  Negative for chest pain, palpitations and leg swelling.  ?Gastrointestinal:  Negative for abdominal pain, blood in stool,  constipation and diarrhea.  ?Genitourinary:  Negative for difficulty urinating, dysuria, frequency, hematuria and urgency.  ?     Slow stream but no dribbling or straining  ?Musculoskeletal:  Positive for arthralgias. Negative for back pain and myalgias.  ?Skin:  Negative for color change and rash.  ?Neurological:  Negative for dizziness, syncope and headaches.   ?Hematological:  Negative for adenopathy.  ?Psychiatric/Behavioral:  Negative for dysphoric mood and sleep disturbance. The patient is not nervous/anxious.   ? ?Patient Active Problem List  ? Diagnosis Date Noted  ? Hx of hepatitis C 01/10/2020  ? Hyperlipidemia associated with type 2 diabetes mellitus ([place]) 03/24/2019  ? DM type 2, controlled, with complication ([place]) 40/98/1191  ? Essential hypertension 06/02/2018  ? Complex tear of medial meniscus of right knee as current injury 11/06/2016  ? Primary osteoarthritis of right knee 11/06/2016  ? Auditory impairment 11/04/2015  ? Benign prostatic hyperplasia with urinary obstruction 11/04/2015  ? 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 Generalized psoriasis 11/04/2015  ? Periodic limb movement 11/04/2015  ? ? ?No Known Allergies ? ?Past Surgical History:  ?Procedure Laterality Date  ? BLEPHAROPLASTY  2016  ? COLONOSCOPY WITH PROPOFOL N/A 05/01/2016  ? Procedure: COLONOSCOPY WITH PROPOFOL;  Surgeon: Robert Bellow, MD;  [facility] ENDOSCOPY;  Service: Endoscopy;  Laterality: N/A;  ? KNEE ARTHROSCOPY Left 01/14/2019  ? Procedure: ARTHROSCOPY KNEE WITH DEBRIDEMENT AND PARTIAL MEDIAL MENISCECTOMY;  Surgeon: Corky Mull, MD;  Location: [facility] ORS;  Service: Orthopedics;  Laterality: Left;  ? KNEE ARTHROSCOPY WITH MEDIAL MENISECTOMY Right 11/27/2016  ? Procedure: KNEE ARTHROSCOPY WITH MEDIAL MENISECTOMY;  Surgeon: Corky Mull, MD;  Location: [address];  Service: Orthopedics;  Laterality: Right;  ? SINUS SURGERY WITH INSTATRAK    ? VASECTOMY    ? ? ?Social History  ? ?Tobacco Use  ? Smoking status: Former  ?  Types: Cigarettes  ?  Quit date: 12/30/1985  ?  Years since quitting: 36.2  ? Smokeless tobacco: Never  ?Vaping Use  ? Vaping Use: Never used  ?Substance Use Topics  ? Alcohol use: Yes  ?  Alcohol/week: 4.0 standard drinks  ?  Types: 4 Standard drinks or equivalent per week  ?  Comment:    ? Drug use: No  ? ? ? ?Medication list has been reviewed and  updated. ? ?Current Meds  ?Medication Sig  ? amLODipine (NORVASC) 5 MG tablet TAKE 1 TABLET(5 MG) BY MOUTH DAILY  ? atorvastatin (LIPITOR) 10 MG tablet TAKE 1 TABLET(10 MG) BY MOUTH DAILY  ? clobetasol (TEMOVATE) 0.05 % GEL Apply 1 application topically 2 (two) times daily.  ? lisinopril (ZESTRIL) 10 MG tablet TAKE 1 TABLET(10 MG) BY MOUTH DAILY  ? meloxicam (MOBIC) 7.5 MG tablet TAKE 1 TABLET(7.5 MG) BY MOUTH DAILY  ? Tildrakizumab-asmn (ILUMYA [place]) Inject into the skin every 3 (three) months.  ? triamcinolone (KENALOG) 0.025 % cream Apply topically 2 (two) times daily.  ? ? ? ?  03/28/2022  ?  8:22 AM 12/21/2021  ? 10:24 AM 12/11/2021  ?  8:55 AM 09/10/2021  ?  9:12 AM  ?GAD 7 : Generalized Anxiety Score  ?Nervous, Anxious, on Edge 1 0 0 0  ?Control/stop worrying 1 0 0 0  ?Worry too much - different things 1 0 0 0  ?Trouble relaxing 0 0 0 0  ?Restless 0 0 0 0  ?Easily annoyed or irritable 1 0 0 0  ?Afraid - awful might happen 1 0 0  0  ?Total GAD 7 Score 5 0 0 0  ?Anxiety Difficulty  Not difficult at all Not difficult at all Not difficult at all  ? ? ? ?  03/28/2022  ?  8:22 AM  ?Depression screen PHQ 2/9  ?Decreased Interest 0  ?Down, Depressed, Hopeless 0  ?PHQ - 2 Score 0  ?Altered sleeping 1  ?Tired, decreased energy 3  ?Change in appetite 0  ?Feeling bad or failure about yourself  0  ?Trouble concentrating 0  ?Moving slowly or fidgety/restless 0  ?Suicidal thoughts 0  ?PHQ-9 Score 4  ?Difficult doing work/chores Not difficult at all  ? ? ?BP Readings from Last 3 Encounters:  ?03/28/22 (!) 142/86  ?01/01/22 (!) 143/84  ?12/11/21 138/86  ? ? ?Physical Exam ?Vitals and nursing note reviewed.  ?Constitutional:   ?   Appearance: Normal appearance. He is well-developed.  ?HENT:  ?   Head: Normocephalic.  ?   Right Ear: Tympanic membrane, ear canal and external ear normal.  ?   Left Ear: Tympanic membrane, ear canal and external ear normal.  ?   Nose: Nose normal.  ?Eyes:  ?   Conjunctiva/sclera: Conjunctivae normal.  ?    Pupils: Pupils are equal, round, and reactive to light.  ?Neck:  ?   Thyroid: No thyromegaly.  ?   Vascular: No carotid bruit.  ?Cardiovascular:  ?   Rate and Rhythm: Normal rate and regular rhythm.  ?   Heart sounds:

## 2022-03-29 LAB — COMPREHENSIVE METABOLIC PANEL
ALT: 30 IU/L (ref 0–44)
AST: 21 IU/L (ref 0–40)
Albumin/Globulin Ratio: 1.7 (ref 1.2–2.2)
Albumin: 4.7 g/dL (ref 3.8–4.8)
Alkaline Phosphatase: 72 IU/L (ref 44–121)
BUN/Creatinine Ratio: 13 (ref 10–24)
BUN: 14 mg/dL (ref 8–27)
Bilirubin Total: 0.4 mg/dL (ref 0.0–1.2)
CO2: 23 mmol/L (ref 20–29)
Calcium: 9.8 mg/dL (ref 8.6–10.2)
Chloride: 103 mmol/L (ref 96–106)
Creatinine, Ser: 1.08 mg/dL (ref 0.76–1.27)
Globulin, Total: 2.8 g/dL (ref 1.5–4.5)
Glucose: 114 mg/dL — ABNORMAL HIGH (ref 70–99)
Potassium: 5.1 mmol/L (ref 3.5–5.2)
Sodium: 140 mmol/L (ref 134–144)
Total Protein: 7.5 g/dL (ref 6.0–8.5)
eGFR: 74 mL/min/{1.73_m2} (ref >59)

## 2022-03-29 LAB — CBC WITH DIFFERENTIAL/PLATELET
Basophils Absolute: 0.1 10*3/uL (ref 0.0–0.2)
Basos: 1 %
EOS (ABSOLUTE): 0.4 10*3/uL (ref 0.0–0.4)
Eos: 6 %
Hematocrit: 44.4 % (ref 37.5–51.0)
Hemoglobin: 14.7 g/dL (ref 13.0–17.7)
Immature Grans (Abs): 0 10*3/uL (ref 0.0–0.1)
Immature Granulocytes: 0 %
Lymphocytes Absolute: 2 10*3/uL (ref 0.7–3.1)
Lymphs: 27 %
MCH: 30.9 pg (ref 26.6–33.0)
MCHC: 33.1 g/dL (ref 31.5–35.7)
MCV: 93 fL (ref 79–97)
Monocytes Absolute: 0.6 10*3/uL (ref 0.1–0.9)
Monocytes: 8 %
Neutrophils Absolute: 4.3 10*3/uL (ref 1.4–7.0)
Neutrophils: 58 %
Platelets: 248 10*3/uL (ref 150–450)
RBC: 4.76 x10E6/uL (ref 4.14–5.80)
RDW: 13.2 % (ref 11.6–15.4)
WBC: 7.4 10*3/uL (ref 3.4–10.8)

## 2022-03-29 LAB — LIPID PANEL
Chol/HDL Ratio: 3.9 ratio (ref 0.0–5.0)
Cholesterol, Total: 176 mg/dL (ref 100–199)
HDL: 45 mg/dL (ref >39)
LDL Chol Calc (NIH): 106 mg/dL — ABNORMAL HIGH (ref 0–99)
Triglycerides: 143 mg/dL (ref 0–149)
VLDL Cholesterol Cal: 25 mg/dL (ref 5–40)

## 2022-03-29 LAB — HEMOGLOBIN A1C
Est. average glucose Bld gHb Est-mCnc: 137 mg/dL
Hgb A1c MFr Bld: 6.4 % — ABNORMAL HIGH (ref 4.8–5.6)

## 2022-03-29 LAB — PSA: Prostate Specific Ag, Serum: 1.3 ng/mL (ref 0.0–4.0)

## 2022-03-30 LAB — MICROALBUMIN / CREATININE URINE RATIO
Creatinine, Urine: 117.4 mg/dL
Microalb/Creat Ratio: 67 mg/g creat — ABNORMAL HIGH (ref 0–29)
Microalbumin, Urine: 78.3 ug/mL

## 2022-04-11 DIAGNOSIS — L408 Other psoriasis: Secondary | ICD-10-CM | POA: Diagnosis not present

## 2022-06-21 ENCOUNTER — Other Ambulatory Visit: Payer: Self-pay | Admitting: Internal Medicine

## 2022-06-21 DIAGNOSIS — E1169 Type 2 diabetes mellitus with other specified complication: Secondary | ICD-10-CM

## 2022-07-08 ENCOUNTER — Ambulatory Visit: Payer: Self-pay | Admitting: *Deleted

## 2022-07-08 ENCOUNTER — Telehealth: Payer: Self-pay | Admitting: Internal Medicine

## 2022-07-08 NOTE — Telephone Encounter (Signed)
  Chief Complaint: leg swelling Symptoms: bilat feet and ankle swelling, slight today, had episode where got hot and sweaty  Frequency: swelling ongoing for 1 year  Pertinent Negatives: Patient denies redness or regular pain Disposition: [] ED /[] Urgent Care (no appt availability in office) / [x] Appointment(In office/virtual)/ []  [place] [facility]/ [] Home Care/ [] Refused Recommended Disposition /[] [facility]/ []  Follow-up with PCP Additional Notes: pt main reason for calling was to report hot and sweaty episode, went and lied down for several mins and got back up and felt fine. He was asking about cardiology referral. I explained normally to start with PCP and then they can request referral if felt necessary. Pt was ok with OV scheduled for tomorrow at 1040 with Dr. d/t Dr. being out this week.   Reason for Disposition  [1] MILD swelling of both ankles (i.e., pedal edema) AND [2] is a chronic symptom (recurrent or ongoing AND present > 4 weeks)  Answer Assessment - Initial Assessment Questions 1. ONSET: "When did the swelling start?" (e.g., minutes, hours, days)     1 year  2. LOCATION: "What part of the leg is swollen?"  "Are both legs swollen or just one leg?"     Both feet and ankles  3. SEVERITY: "How bad is the swelling?" (e.g., localized; mild, moderate, severe)  - Localized - small area of swelling localized to one leg  - MILD pedal edema - swelling limited to foot and ankle, pitting edema < 1/4 inch (6 mm) deep, rest and elevation eliminate most or all swelling  - MODERATE edema - swelling of lower leg to knee, pitting edema > 1/4 inch (6 mm) deep, rest and elevation only partially reduce swelling  - SEVERE edema - swelling extends above knee, facial or hand swelling present      Slightly  4. REDNESS: "Does the swelling look red or infected?"     no 5. PAIN: "Is the swelling painful to touch?" If Yes, ask: "How painful is it?"   (Scale 1-10; mild,  moderate or severe)     R foot turned on side will hurt but only when standing for long periods  10. OTHER SYMPTOMS: "Do you have any other symptoms?" (e.g., chest pain, difficulty breathing)       Had period this morning where felt hot and sweaty  Protocols used: Leg Swelling and Edema-A-AH

## 2022-07-08 NOTE — Telephone Encounter (Signed)
Summary: Swelling in feet and ankles   [phone] Pt called reporting that he has been experiencing swelling in his feet and mostly his ankles. Experiencing this for over a year.       Called patient to review swelling in feet and ankles x 1 year. No answer, LVMTCB [phone].

## 2022-07-08 NOTE — Telephone Encounter (Signed)
Copied from CRM [id number]. Topic: Referral - Request for Referral >> Jul 08, 2022 12:13 PM Franchot Heidelberg wrote: Has patient seen PCP for this complaint? Yes.   *If NO, is insurance requiring patient see PCP for this issue before PCP can refer them? Referral for which specialty: Cardiology  Preferred provider/office: highest recommended  Reason for referral: Has not been feeling well

## 2022-07-09 ENCOUNTER — Encounter: Payer: Self-pay | Admitting: Family Medicine

## 2022-07-09 ENCOUNTER — Ambulatory Visit (INDEPENDENT_AMBULATORY_CARE_PROVIDER_SITE_OTHER): Payer: Medicare Other | Admitting: Family Medicine

## 2022-07-09 VITALS — BP 112/70 | HR 84 | Ht 76.0 in | Wt 316.0 lb

## 2022-07-09 DIAGNOSIS — R5383 Other fatigue: Secondary | ICD-10-CM

## 2022-07-09 DIAGNOSIS — R609 Edema, unspecified: Secondary | ICD-10-CM | POA: Diagnosis not present

## 2022-07-09 NOTE — Patient Instructions (Signed)
Low-Sodium Eating Plan Sodium, which is an element that makes up salt, helps you maintain a healthy balance of fluids in your body. Too much sodium can increase your blood pressure and cause fluid and waste to be held in your body. Your health care provider or dietitian may recommend following this plan if you have high blood pressure (hypertension), kidney disease, liver disease, or heart failure. Eating less sodium can help lower your blood pressure, reduce swelling, and protect your heart, liver, and kidneys. What are tips for following this plan? Reading food labels The Nutrition Facts label lists the amount of sodium in one serving of the food. If you eat more than one serving, you must multiply the listed amount of sodium by the number of servings. Choose foods with less than 140 mg of sodium per serving. Avoid foods with 300 mg of sodium or more per serving. Shopping  Look for lower-sodium products, often labeled as "low-sodium" or "no salt added." Always check the sodium content, even if foods are labeled as "unsalted" or "no salt added." Buy fresh foods. Avoid canned foods and pre-made or frozen meals. Avoid canned, cured, or processed meats. Buy breads that have less than 80 mg of sodium per slice. Cooking  Eat more home-cooked food and less restaurant, buffet, and fast food. Avoid adding salt when cooking. Use salt-free seasonings or herbs instead of table salt or sea salt. Check with your health care provider or pharmacist before using salt substitutes. Cook with plant-based oils, such as canola, sunflower, or olive oil. Meal planning When eating at a restaurant, ask that your food be prepared with less salt or no salt, if possible. Avoid dishes labeled as brined, pickled, cured, smoked, or made with soy sauce, miso, or teriyaki sauce. Avoid foods that contain MSG (monosodium glutamate). MSG is sometimes added to Chinese food, bouillon, and some canned foods. Make meals that can  be grilled, baked, poached, roasted, or steamed. These are generally made with less sodium. General information Most people on this plan should limit their sodium intake to 1,500-2,000 mg (milligrams) of sodium each day. What foods should I eat? Fruits Fresh, frozen, or canned fruit. Fruit juice. Vegetables Fresh or frozen vegetables. "No salt added" canned vegetables. "No salt added" tomato sauce and paste. Low-sodium or reduced-sodium tomato and vegetable juice. Grains Low-sodium cereals, including oats, puffed wheat and rice, and shredded wheat. Low-sodium crackers. Unsalted rice. Unsalted pasta. Low-sodium bread. Whole-grain breads and whole-grain pasta. Meats and other proteins Fresh or frozen (no salt added) meat, poultry, seafood, and fish. Low-sodium canned tuna and salmon. Unsalted nuts. Dried peas, beans, and lentils without added salt. Unsalted canned beans. Eggs. Unsalted nut butters. Dairy Milk. Soy milk. Cheese that is naturally low in sodium, such as ricotta cheese, fresh mozzarella, or Swiss cheese. Low-sodium or reduced-sodium cheese. Cream cheese. Yogurt. Seasonings and condiments Fresh and dried herbs and spices. Salt-free seasonings. Low-sodium mustard and ketchup. Sodium-free salad dressing. Sodium-free light mayonnaise. Fresh or refrigerated horseradish. Lemon juice. Vinegar. Other foods Homemade, reduced-sodium, or low-sodium soups. Unsalted popcorn and pretzels. Low-salt or salt-free chips. The items listed above may not be a complete list of foods and beverages you can eat. Contact a dietitian for more information. What foods should I avoid? Vegetables Sauerkraut, pickled vegetables, and relishes. Olives. French fries. Onion rings. Regular canned vegetables (not low-sodium or reduced-sodium). Regular canned tomato sauce and paste (not low-sodium or reduced-sodium). Regular tomato and vegetable juice (not low-sodium or reduced-sodium). Frozen vegetables in  sauces. Grains   Instant hot cereals. Bread stuffing, pancake, and biscuit mixes. Croutons. Seasoned rice or pasta mixes. Noodle soup cups. Boxed or frozen macaroni and cheese. Regular salted crackers. Self-rising flour. Meats and other proteins Meat or fish that is salted, canned, smoked, spiced, or pickled. Precooked or cured meat, such as sausages or meat loaves. Bacon. Ham. Pepperoni. Hot dogs. Corned beef. Chipped beef. Salt pork. Jerky. Pickled herring. Anchovies and sardines. Regular canned tuna. Salted nuts. Dairy Processed cheese and cheese spreads. Hard cheeses. Cheese curds. Blue cheese. Feta cheese. String cheese. Regular cottage cheese. Buttermilk. Canned milk. Fats and oils Salted butter. Regular margarine. Ghee. Bacon fat. Seasonings and condiments Onion salt, garlic salt, seasoned salt, table salt, and sea salt. Canned and packaged gravies. Worcestershire sauce. Tartar sauce. Barbecue sauce. Teriyaki sauce. Soy sauce, including reduced-sodium. Steak sauce. Fish sauce. Oyster sauce. Cocktail sauce. Horseradish that you find on the shelf. Regular ketchup and mustard. Meat flavorings and tenderizers. Bouillon cubes. Hot sauce. Pre-made or packaged marinades. Pre-made or packaged taco seasonings. Relishes. Regular salad dressings. Salsa. Other foods Salted popcorn and pretzels. Corn chips and puffs. Potato and tortilla chips. Canned or dried soups. Pizza. Frozen entrees and pot pies. The items listed above may not be a complete list of foods and beverages you should avoid. Contact a dietitian for more information. Summary Eating less sodium can help lower your blood pressure, reduce swelling, and protect your heart, liver, and kidneys. Most people on this plan should limit their sodium intake to 1,500-2,000 mg (milligrams) of sodium each day. Canned, boxed, and frozen foods are high in sodium. Restaurant foods, fast foods, and pizza are also very high in sodium. You also get sodium by  adding salt to food. Try to cook at home, eat more fresh fruits and vegetables, and eat less fast food and canned, processed, or prepared foods. This information is not intended to replace advice given to you by your health care provider. Make sure you discuss any questions you have with your health care provider. Document Revised: 01/21/2020 Document Reviewed: 11/17/2019 Elsevier Patient Education  2023 [employer]  

## 2022-07-09 NOTE — Progress Notes (Signed)
Date:  07/09/2022   Name:  Benjamin Medina   DOB:  1951/12/29   MRN:  [id number]   Chief Complaint: Leg Swelling (After standing or walking for a while/ end of the day gets worse. No pain "normally")  Thyroid Problem Presents for initial (increasing fatigue) visit. The condition has lasted for 6 months. Symptoms include fatigue, leg swelling (6) and weight gain. Patient reports no anxiety, constipation, diarrhea, dry skin, hair loss, nail problem or palpitations. The symptoms have been improving.    Lab Results  Component Value Date   NA 140 03/28/2022   K 5.1 03/28/2022   CO2 23 03/28/2022   GLUCOSE 114 (H) 03/28/2022   BUN 14 03/28/2022   CREATININE 1.08 03/28/2022   CALCIUM 9.8 03/28/2022   EGFR 74 03/28/2022   GFRNONAA 70 09/25/2020   Lab Results  Component Value Date   CHOL 176 03/28/2022   HDL 45 03/28/2022   LDLCALC 106 (H) 03/28/2022   TRIG 143 03/28/2022   CHOLHDL 3.9 03/28/2022   Lab Results  Component Value Date   TSH 0.850 03/24/2019   Lab Results  Component Value Date   HGBA1C 6.4 (H) 03/28/2022   Lab Results  Component Value Date   WBC 7.4 03/28/2022   HGB 14.7 03/28/2022   HCT 44.4 03/28/2022   MCV 93 03/28/2022   PLT 248 03/28/2022   Lab Results  Component Value Date   ALT 30 03/28/2022   AST 21 03/28/2022   ALKPHOS 72 03/28/2022   BILITOT 0.4 03/28/2022   No results found for: "25OHVITD2", "25OHVITD3", "VD25OH"   Review of Systems  Constitutional:  Positive for fatigue and weight gain. Negative for chills and fever.  HENT:  Negative for drooling, ear discharge, ear pain and sore throat.   Respiratory:  Negative for cough, chest tightness, shortness of breath and wheezing.   Cardiovascular:  Negative for chest pain, palpitations and leg swelling.  Gastrointestinal:  Negative for abdominal pain, blood in stool, constipation, diarrhea and nausea.  Endocrine: Negative for polydipsia.  Genitourinary:  Negative for dysuria, frequency,  hematuria and urgency.  Musculoskeletal:  Positive for arthralgias. Negative for back pain, myalgias and neck pain.  Skin:  Negative for rash.  Allergic/Immunologic: Negative for environmental allergies.  Neurological:  Negative for dizziness and headaches.  Hematological:  Does not bruise/bleed easily.  Psychiatric/Behavioral:  Negative for suicidal ideas. The patient is not nervous/anxious.     Patient Active Problem List   Diagnosis Date Noted   Hx of hepatitis C 01/10/2020   Hyperlipidemia associated with type 2 diabetes mellitus ([place]) 03/24/2019   DM type 2, controlled, with complication ([place]) 38/45/3646   Essential hypertension 06/02/2018   Complex tear of medial meniscus of right knee as current injury 11/06/2016   Primary osteoarthritis of right knee 11/06/2016   Auditory impairment 11/04/2015   Benign prostatic hyperplasia with urinary obstruction 11/04/2015   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   Generalized psoriasis 11/04/2015   Periodic limb movement 11/04/2015    No Known Allergies  Past Surgical History:  Procedure Laterality Date   BLEPHAROPLASTY  2016   COLONOSCOPY WITH PROPOFOL N/A 05/01/2016   Procedure: COLONOSCOPY WITH PROPOFOL;  Surgeon: Robert Bellow, MD;  Location: [facility] ENDOSCOPY;  Service: Endoscopy;  Laterality: N/A;   KNEE ARTHROSCOPY Left 01/14/2019   Procedure: ARTHROSCOPY KNEE WITH DEBRIDEMENT AND PARTIAL MEDIAL MENISCECTOMY;  Surgeon: Corky Mull, MD;  Location: [facility] ORS;  Service: Orthopedics;  Laterality: Left;   KNEE ARTHROSCOPY  WITH MEDIAL MENISECTOMY Right 11/27/2016   Procedure: KNEE ARTHROSCOPY WITH MEDIAL MENISECTOMY;  Surgeon: Corky Mull, MD;  Location: [facility];  Service: Orthopedics;  Laterality: Right;   SINUS SURGERY WITH INSTATRAK     VASECTOMY      Social History   Tobacco Use   Smoking status: Former    Types: Cigarettes    Quit date: 12/30/1985    Years since quitting: 36.5   Smokeless tobacco: Never   Vaping Use   Vaping Use: Never used  Substance Use Topics   Alcohol use: Yes    Alcohol/week: 4.0 standard drinks of alcohol    Types: 4 Standard drinks or equivalent per week    Comment:     Drug use: No     Medication list has been reviewed and updated.  Current Meds  Medication Sig   amLODipine (NORVASC) 5 MG tablet TAKE 1 TABLET(5 MG) BY MOUTH DAILY   atorvastatin (LIPITOR) 10 MG tablet TAKE 1 TABLET(10 MG) BY MOUTH DAILY   clobetasol (TEMOVATE) 0.05 % GEL Apply 1 application topically 2 (two) times daily.   lisinopril (ZESTRIL) 20 MG tablet TAKE 1 TABLET(10 MG) BY MOUTH DAILY   meloxicam (MOBIC) 7.5 MG tablet TAKE 1 TABLET(7.5 MG) BY MOUTH DAILY   Tildrakizumab-asmn (ILUMYA [place]) Inject into the skin every 3 (three) months.   triamcinolone (KENALOG) 0.025 % cream Apply topically 2 (two) times daily.       07/09/2022   10:50 AM 03/28/2022    8:22 AM 12/21/2021   10:24 AM 12/11/2021    8:55 AM  GAD 7 : Generalized Anxiety Score  Nervous, Anxious, on Edge 1 1 0 0  Control/stop worrying 1 1 0 0  Worry too much - different things 0 1 0 0  Trouble relaxing 0 0 0 0  Restless 0 0 0 0  Easily annoyed or irritable 2 1 0 0  Afraid - awful might happen 1 1 0 0  Total GAD 7 Score 5 5 0 0  Anxiety Difficulty Not difficult at all  Not difficult at all Not difficult at all       07/09/2022   10:50 AM 03/28/2022    8:22 AM 03/13/2022   10:47 AM  Depression screen PHQ 2/9  Decreased Interest 1 0 0  Down, Depressed, Hopeless 1 0 0  PHQ - 2 Score 2 0 0  Altered sleeping 1 1   Tired, decreased energy 3 3   Change in appetite 0 0   Feeling bad or failure about yourself  0 0   Trouble concentrating 0 0   Moving slowly or fidgety/restless 0 0   Suicidal thoughts 0 0   PHQ-9 Score 6 4   Difficult doing work/chores Not difficult at all Not difficult at all     BP Readings from Last 3 Encounters:  07/09/22 112/70  03/28/22 (!) 142/86  01/01/22 (!) 143/84    Physical  Exam Vitals and nursing note reviewed.  HENT:     Head: Normocephalic.     Right Ear: Tympanic membrane and external ear normal.     Left Ear: Tympanic membrane and external ear normal.     Nose: Nose normal.  Eyes:     General: No scleral icterus.       Right eye: No discharge.        Left eye: No discharge.     Conjunctiva/sclera: Conjunctivae normal.     Pupils: Pupils are equal, round, and reactive  to light.  Neck:     Thyroid: No thyromegaly.     Vascular: No carotid bruit, hepatojugular reflux or JVD.     Trachea: No tracheal deviation.  Cardiovascular:     Rate and Rhythm: Normal rate and regular rhythm.     Heart sounds: Normal heart sounds, S1 normal and S2 normal. No murmur heard.    No systolic murmur is present.     No diastolic murmur is present.     No friction rub. No gallop. No S3 or S4 sounds.  Pulmonary:     Effort: No respiratory distress.     Breath sounds: Normal breath sounds. No decreased breath sounds, wheezing, rhonchi or rales.  Abdominal:     General: Bowel sounds are normal.     Palpations: Abdomen is soft. There is no mass.     Tenderness: There is no abdominal tenderness. There is no guarding or rebound.  Musculoskeletal:        General: No tenderness. Normal range of motion.     Cervical back: Normal range of motion and neck supple.     Right lower leg: 1+ Pitting Edema present.     Left lower leg: 1+ Pitting Edema present.  Lymphadenopathy:     Cervical: No cervical adenopathy.  Skin:    General: Skin is warm.     Findings: No rash.  Neurological:     Mental Status: He is alert.     Deep Tendon Reflexes: Reflexes are normal and symmetric.     Wt Readings from Last 3 Encounters:  07/09/22 (!) 316 lb (143.3 kg)  03/28/22 (!) 315 lb (142.9 kg)  01/01/22 (!) 312 lb (141.5 kg)    BP 112/70   Pulse 84   Ht '6\' 4"'  (1.93 m)   Wt (!) 316 lb (143.3 kg)   SpO2 95%   BMI 38.46 kg/m   Assessment and Plan:  1. Fatigue, unspecified  type Actual reason for coming.  New onset for about 6 months.  Without DOE orthopnea or PND.  Increasing fatigue/shortness of breath with activity.  No chest tightness associated with this.  Patient also has noted below concern of edema of the lower extremities differential diagnosis includes CHF/thyroid/general deconditioning/metabolic. - Ambulatory referral to Vascular Surgery - Thyroid Panel With TSH  2. Edema, unspecified type New onset.  Persistent.  Noted mostly in the evenings.  This could be consistent with several things including venous insufficiency, amlodipine, NSAID use for arthritis of the knees, or early stages of congestive heart failure.  Examination noted no JVD, hepatojugular reflux, nor pulmonic rales.  Blood pressure is excellent today since increase of lisinopril to 20 and this has been tolerated well.  Initiate evaluation of lower extremity venous integrity.  Patient will follow-up after vascular evaluation. - Ambulatory referral to Vascular Surgery

## 2022-07-10 LAB — THYROID PANEL WITH TSH
Free Thyroxine Index: 1.6 (ref 1.2–4.9)
T3 Uptake Ratio: 25 % (ref 24–39)
T4, Total: 6.5 ug/dL (ref 4.5–12.0)
TSH: 1.04 u[IU]/mL (ref 0.450–4.500)

## 2022-07-15 ENCOUNTER — Encounter: Payer: Self-pay | Admitting: Internal Medicine

## 2022-07-15 ENCOUNTER — Other Ambulatory Visit: Payer: Self-pay

## 2022-07-15 DIAGNOSIS — R5383 Other fatigue: Secondary | ICD-10-CM

## 2022-07-15 DIAGNOSIS — R609 Edema, unspecified: Secondary | ICD-10-CM

## 2022-08-08 ENCOUNTER — Encounter (INDEPENDENT_AMBULATORY_CARE_PROVIDER_SITE_OTHER): Payer: Self-pay | Admitting: Vascular Surgery

## 2022-08-08 ENCOUNTER — Ambulatory Visit (INDEPENDENT_AMBULATORY_CARE_PROVIDER_SITE_OTHER): Payer: Medicare Other | Admitting: Vascular Surgery

## 2022-08-08 VITALS — BP 151/78 | HR 82 | Resp 16 | Ht 75.0 in | Wt 310.2 lb

## 2022-08-08 DIAGNOSIS — I10 Essential (primary) hypertension: Secondary | ICD-10-CM | POA: Diagnosis not present

## 2022-08-08 DIAGNOSIS — E1169 Type 2 diabetes mellitus with other specified complication: Secondary | ICD-10-CM | POA: Diagnosis not present

## 2022-08-08 DIAGNOSIS — E118 Type 2 diabetes mellitus with unspecified complications: Secondary | ICD-10-CM

## 2022-08-08 DIAGNOSIS — E785 Hyperlipidemia, unspecified: Secondary | ICD-10-CM

## 2022-08-08 DIAGNOSIS — I890 Lymphedema, not elsewhere classified: Secondary | ICD-10-CM

## 2022-08-08 NOTE — Progress Notes (Signed)
MRN : [id number]  Benjamin Medina is a 71 y.o. (1951/07/31) male who presents with chief complaint of legs swell.  History of Present Illness:   Patient is seen for evaluation of leg swelling. The patient first noticed the swelling remotely but is now concerned because of a significant increase in the overall edema. The swelling isn't associated with significant pain.  There has been an increasing amount of  discoloration noted by the patient. The patient notes that in the morning the legs are improved but they steadily worsened throughout the course of the day. Elevation seems to make the swelling of the legs better, dependency makes them much worse.   There is no history of ulcerations associated with the swelling.   The patient denies any recent changes in their medications.  The patient has not been wearing graduated compression.  The patient has no had any past angiography, interventions or vascular surgery.  The patient denies a history of DVT or PE. There is no prior history of phlebitis. There is no history of primary lymphedema.  There is no history of radiation treatment to the groin or pelvis No history of malignancies. No history of trauma or groin or pelvic surgery. No history of foreign travel or parasitic infections area    Current Meds  Medication Sig   amLODipine (NORVASC) 5 MG tablet TAKE 1 TABLET(5 MG) BY MOUTH DAILY   atorvastatin (LIPITOR) 10 MG tablet TAKE 1 TABLET(10 MG) BY MOUTH DAILY   clobetasol (TEMOVATE) 0.05 % GEL Apply 1 application topically 2 (two) times daily.   lisinopril (ZESTRIL) 20 MG tablet TAKE 1 TABLET(10 MG) BY MOUTH DAILY   meloxicam (MOBIC) 7.5 MG tablet TAKE 1 TABLET(7.5 MG) BY MOUTH DAILY   Tildrakizumab-asmn (ILUMYA [place]) Inject into the skin every 3 (three) months.   triamcinolone (KENALOG) 0.025 % cream Apply topically 2 (two) times daily.    Past Medical History:  Diagnosis Date   Arthritis    Auditory impairment    BPH  with urinary obstruction    Contracture of hand    PALMAR FASCIA   Diabetes mellitus without complication ([place])    Hyperlipidemia    Hypertension    Psoriasis     Past Surgical History:  Procedure Laterality Date   BLEPHAROPLASTY  2016   COLONOSCOPY WITH PROPOFOL N/A 05/01/2016   Procedure: COLONOSCOPY WITH PROPOFOL;  Surgeon: Robert Bellow, MD;  Location: [facility] ENDOSCOPY;  Service: Endoscopy;  Laterality: N/A;   KNEE ARTHROSCOPY Left 01/14/2019   Procedure: ARTHROSCOPY KNEE WITH DEBRIDEMENT AND PARTIAL MEDIAL MENISCECTOMY;  Surgeon: Corky Mull, MD;  Location: [facility] ORS;  Service: Orthopedics;  Laterality: Left;   KNEE ARTHROSCOPY WITH MEDIAL MENISECTOMY Right 11/27/2016   Procedure: KNEE ARTHROSCOPY WITH MEDIAL MENISECTOMY;  Surgeon: Corky Mull, MD;  Location: [place];  Service: Orthopedics;  Laterality: Right;   SINUS SURGERY WITH INSTATRAK     VASECTOMY      Social History Social History   Tobacco Use   Smoking status: Former    Types: Cigarettes    Quit date: 12/30/1985    Years since quitting: 36.6   Smokeless tobacco: Never  Vaping Use   Vaping Use: Never used  Substance Use Topics   Alcohol use: Yes    Alcohol/week: 4.0 standard drinks of alcohol    Types: 4 Standard drinks or equivalent per week    Comment:     Drug use: No    Family History Family  History  Problem Relation Age of Onset   Colon cancer Mother 21   Lymphoma Father     No Known Allergies   REVIEW OF SYSTEMS (Negative unless checked)  Constitutional: [] Weight loss  [] Fever  [] Chills Cardiac: [] Chest pain   [] Chest pressure   [] Palpitations   [] Shortness of breath when laying flat   [] Shortness of breath with exertion. Vascular:  [] Pain in legs with walking   [x] Pain in legs with standing  [] History of DVT   [] Phlebitis   [x] Swelling in legs   [] Varicose veins   [] Non-healing ulcers Pulmonary:   [] Uses home oxygen   [] Productive cough   [] Hemoptysis   [] Wheeze  [] COPD    [] Asthma Neurologic:  [] Dizziness   [] Seizures   [] History of stroke   [] History of TIA  [] Aphasia   [] Vissual changes   [] Weakness or numbness in arm   [] Weakness or numbness in leg Musculoskeletal:   [] Joint swelling   [] Joint pain   [] Low back pain Hematologic:  [] Easy bruising  [] Easy bleeding   [] Hypercoagulable state   [] Anemic Gastrointestinal:  [] Diarrhea   [] Vomiting  [] Gastroesophageal reflux/heartburn   [] Difficulty swallowing. Genitourinary:  [] Chronic kidney disease   [] Difficult urination  [] Frequent urination   [] Blood in urine Skin:  [] Rashes   [] Ulcers  Psychological:  [] History of anxiety   []  History of major depression.  Physical Examination  Vitals:   08/08/22 1511  BP: (!) 151/78  Pulse: 82  Resp: 16  Weight: (!) 310 lb 3.2 oz (140.7 kg)  Height: 6\' 3"  (1.905 m)   Body mass index is 38.77 kg/m. Gen: WD/WN, NAD Head: [place]/AT, No temporalis wasting.  Ear/Nose/Throat: Hearing grossly intact, nares w/o erythema or drainage, pinna without lesions Eyes: PER, EOMI, sclera nonicteric.  Neck: Supple, no gross masses.  No JVD.  Pulmonary:  Good air movement, no audible wheezing, no use of accessory muscles.  Cardiac: RRR, precordium not hyperdynamic. Vascular:  scattered varicosities present bilaterally.  Mild venous stasis changes to the legs bilaterally.  1+ soft pitting edema  Vessel Right Left  Radial Palpable Palpable  Gastrointestinal: soft, non-distended. No guarding/no peritoneal signs.  Musculoskeletal: M/S 5/5 throughout.  No deformity.  Neurologic: CN 2-12 intact. Pain and light touch intact in extremities.  Symmetrical.  Speech is fluent. Motor exam as listed above. Psychiatric: Judgment intact, Mood & affect appropriate for pt's clinical situation. Dermatologic: Venous rashes no ulcers noted.  No changes consistent with cellulitis. Lymph : No lichenification or skin changes of chronic lymphedema.  CBC Lab Results  Component Value Date   WBC 7.4  03/28/2022   HGB 14.7 03/28/2022   HCT 44.4 03/28/2022   MCV 93 03/28/2022   PLT 248 03/28/2022    BMET    Component Value Date/Time   NA 140 03/28/2022 0907   K 5.1 03/28/2022 0907   CL 103 03/28/2022 0907   CO2 23 03/28/2022 0907   GLUCOSE 114 (H) 03/28/2022 0907   BUN 14 03/28/2022 0907   CREATININE 1.08 03/28/2022 0907   CALCIUM 9.8 03/28/2022 0907   GFRNONAA 70 09/25/2020 1409   GFRAA 81 09/25/2020 1409   CrCl cannot be calculated (Patient's most recent lab result is older than the maximum 21 days allowed.).  COAG No results found for: "INR", "PROTIME"  Radiology No results found.   Assessment/Plan 1. Lymphedema Recommend:  No surgery or intervention at this point in time.  I have reviewed my discussion with the patient regarding venous insufficiency and why it causes  symptoms. I have discussed with the patient the chronic skin changes that accompany venous insufficiency and the long term sequela such as ulceration. Patient will contnue wearing graduated compression stockings on a daily basis, as this has provided excellent control of his edema. The patient will put the stockings on first thing in the morning and removing them in the evening. The patient is reminded not to sleep in the stockings.  In addition, behavioral modification including elevation during the day will be initiated. Exercise is strongly encouraged.  Previous duplex ultrasound of the lower extremities shows normal deep system, no significant superficial reflux was identified.  Given the patient's good control and lack of any problems regarding the venous insufficiency and lymphedema a lymph pump in not need at this time.    The patient will follow up with me PRN should anything change.  The patient voices agreement with this plan.   2. Essential hypertension Continue antihypertensive medications as already ordered, these medications have been reviewed and there are no changes at this time.    3. DM type 2, controlled, with complication (HCC) Continue hypoglycemic medications as already ordered, these medications have been reviewed and there are no changes at this time.  Hgb A1C to be monitored as already arranged by primary service   4. Hyperlipidemia associated with type 2 diabetes mellitus (HCC) Continue statin as ordered and reviewed, no changes at this time     Levora Dredge, MD  08/08/2022 3:42 PM

## 2022-08-09 ENCOUNTER — Encounter (INDEPENDENT_AMBULATORY_CARE_PROVIDER_SITE_OTHER): Payer: Self-pay | Admitting: Vascular Surgery

## 2022-08-09 DIAGNOSIS — I890 Lymphedema, not elsewhere classified: Secondary | ICD-10-CM | POA: Insufficient documentation

## 2022-08-12 ENCOUNTER — Ambulatory Visit (INDEPENDENT_AMBULATORY_CARE_PROVIDER_SITE_OTHER): Payer: Medicare Other | Admitting: Internal Medicine

## 2022-08-12 ENCOUNTER — Encounter: Payer: Self-pay | Admitting: Internal Medicine

## 2022-08-12 VITALS — BP 114/68 | HR 71 | Ht 75.0 in | Wt 313.0 lb

## 2022-08-12 DIAGNOSIS — E118 Type 2 diabetes mellitus with unspecified complications: Secondary | ICD-10-CM | POA: Diagnosis not present

## 2022-08-12 DIAGNOSIS — I10 Essential (primary) hypertension: Secondary | ICD-10-CM | POA: Diagnosis not present

## 2022-08-12 DIAGNOSIS — R5383 Other fatigue: Secondary | ICD-10-CM

## 2022-08-12 NOTE — Progress Notes (Signed)
Date:  08/12/2022   Name:  Benjamin Medina   DOB:  1951/07/21   MRN:  [id number]   Chief Complaint: Hypertension and Diabetes  Hypertension This is a chronic problem. The problem is controlled. Pertinent negatives include no chest pain, headaches, palpitations or shortness of breath. Past treatments include ACE inhibitors and calcium channel blockers. The current treatment provides significant improvement. There are no compliance problems.  There is no history of kidney disease, CAD/MI or CVA.  Diabetes He presents for his follow-up diabetic visit. He has type 2 diabetes mellitus. His disease course has been stable. Pertinent negatives for hypoglycemia include no dizziness, headaches or tremors. Pertinent negatives for diabetes include no chest pain, no fatigue, no polydipsia, no polyuria and no weakness. Pertinent negatives for diabetic complications include no CVA. Current diabetic treatment includes diet.    Lab Results  Component Value Date   NA 140 03/28/2022   K 5.1 03/28/2022   CO2 23 03/28/2022   GLUCOSE 114 (H) 03/28/2022   BUN 14 03/28/2022   CREATININE 1.08 03/28/2022   CALCIUM 9.8 03/28/2022   EGFR 74 03/28/2022   GFRNONAA 70 09/25/2020   Lab Results  Component Value Date   CHOL 176 03/28/2022   HDL 45 03/28/2022   LDLCALC 106 (H) 03/28/2022   TRIG 143 03/28/2022   CHOLHDL 3.9 03/28/2022   Lab Results  Component Value Date   TSH 1.040 07/09/2022   Lab Results  Component Value Date   HGBA1C 6.4 (H) 03/28/2022   Lab Results  Component Value Date   WBC 7.4 03/28/2022   HGB 14.7 03/28/2022   HCT 44.4 03/28/2022   MCV 93 03/28/2022   PLT 248 03/28/2022   Lab Results  Component Value Date   ALT 30 03/28/2022   AST 21 03/28/2022   ALKPHOS 72 03/28/2022   BILITOT 0.4 03/28/2022   No results found for: "25OHVITD2", "25OHVITD3", "VD25OH"   Review of Systems  Constitutional:  Negative for appetite change, fatigue and unexpected weight change.  HENT:   Negative for nosebleeds.   Eyes:  Negative for visual disturbance.  Respiratory:  Negative for cough, chest tightness, shortness of breath and wheezing.   Cardiovascular:  Negative for chest pain, palpitations and leg swelling.  Gastrointestinal:  Negative for abdominal pain, blood in stool, constipation and diarrhea.  Endocrine: Negative for polydipsia and polyuria.  Genitourinary:  Negative for dysuria and hematuria.  Skin:  Negative for color change and rash.  Neurological:  Negative for dizziness, tremors, weakness, light-headedness, numbness and headaches.  Psychiatric/Behavioral:  Negative for dysphoric mood.     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   Generalized psoriasis 11/04/2015   Periodic limb movement 11/04/2015    No Known Allergies  Past Surgical History:  Procedure Laterality Date   BLEPHAROPLASTY  2016   COLONOSCOPY WITH PROPOFOL N/A 05/01/2016   Procedure: COLONOSCOPY WITH PROPOFOL;  Surgeon: Robert Bellow, MD;  [facility];  Service: Endoscopy;  Laterality: N/A;   KNEE ARTHROSCOPY Left  01/14/2019   Procedure: ARTHROSCOPY KNEE WITH DEBRIDEMENT AND PARTIAL MEDIAL MENISCECTOMY;  Surgeon: Corky Mull, MD;  Location: [facility] ORS;  Service: Orthopedics;  Laterality: Left;   KNEE ARTHROSCOPY WITH MEDIAL MENISECTOMY Right 11/27/2016   Procedure: KNEE ARTHROSCOPY WITH MEDIAL MENISECTOMY;  Surgeon: Corky Mull, MD;  Location: [facility];  Service:  Orthopedics;  Laterality: Right;   SINUS SURGERY WITH INSTATRAK     VASECTOMY      Social History   Tobacco Use   Smoking status: Former    Types: Cigarettes    Quit date: 12/30/1985    Years since quitting: 36.6   Smokeless tobacco: Never  Vaping Use   Vaping Use: Never used  Substance Use Topics   Alcohol use: Yes    Alcohol/week: 4.0 standard drinks of alcohol    Types: 4 Standard drinks or equivalent per week    Comment:     Drug use: No     Medication list has been reviewed and updated.  Current Meds  Medication Sig   amLODipine (NORVASC) 5 MG tablet TAKE 1 TABLET(5 MG) BY MOUTH DAILY   atorvastatin (LIPITOR) 10 MG tablet TAKE 1 TABLET(10 MG) BY MOUTH DAILY   clobetasol (TEMOVATE) 0.05 % GEL Apply 1 application topically 2 (two) times daily.   lisinopril (ZESTRIL) 20 MG tablet TAKE 1 TABLET(10 MG) BY MOUTH DAILY   meloxicam (MOBIC) 7.5 MG tablet TAKE 1 TABLET(7.5 MG) BY MOUTH DAILY   Tildrakizumab-asmn (ILUMYA [place]) Inject into the skin every 3 (three) months.   triamcinolone (KENALOG) 0.025 % cream Apply topically 2 (two) times daily.       08/12/2022   11:28 AM 07/09/2022   10:50 AM 03/28/2022    8:22 AM 12/21/2021   10:24 AM  GAD 7 : Generalized Anxiety Score  Nervous, Anxious, on Edge 0 1 1 0  Control/stop worrying 0 1 1 0  Worry too much - different things 0 0 1 0  Trouble relaxing 0 0 0 0  Restless 0 0 0 0  Easily annoyed or irritable 0 2 1 0  Afraid - awful might happen 0 1 1 0  Total GAD 7 Score 0 5 5 0  Anxiety Difficulty Not difficult at all Not difficult at all  Not difficult at all       08/12/2022   11:27 AM 07/09/2022   10:50 AM 03/28/2022    8:22 AM  Depression screen PHQ 2/9  Decreased Interest 0 1 0  Down, Depressed, Hopeless 0 1 0  PHQ - 2 Score 0 2 0  Altered sleeping 0 1 1  Tired, decreased energy '2 3 3  ' Change in appetite 0 0 0  Feeling bad or failure about yourself  0 0 0  Trouble concentrating 0 0 0  Moving slowly or  fidgety/restless 0 0 0  Suicidal thoughts 0 0 0  PHQ-9 Score '2 6 4  ' Difficult doing work/chores Not difficult at all Not difficult at all Not difficult at all    BP Readings from Last 3 Encounters:  08/12/22 114/68  08/08/22 (!) 151/78  07/09/22 112/70    Physical Exam Vitals and nursing note reviewed.  Constitutional:      General: He is not in acute distress.    Appearance: He is well-developed.  HENT:     Head: Normocephalic and atraumatic.  Cardiovascular:     Rate and Rhythm: Normal rate and regular rhythm.  Pulmonary:     Effort: Pulmonary effort is  normal. No respiratory distress.     Breath sounds: No wheezing or rhonchi.  Musculoskeletal:     Right lower leg: Edema present.     Left lower leg: Edema present.     Comments: Trace edema  Lymphadenopathy:     Cervical: No cervical adenopathy.  Skin:    General: Skin is warm and dry.     Findings: No rash.  Neurological:     Mental Status: He is alert and oriented to person, place, and time.  Psychiatric:        Mood and Affect: Mood normal.        Behavior: Behavior normal.     Wt Readings from Last 3 Encounters:  08/12/22 (!) 313 lb (142 kg)  08/08/22 (!) 310 lb 3.2 oz (140.7 kg)  07/09/22 (!) 316 lb (143.3 kg)    BP 114/68   Pulse 71   Ht '6\' 3"'  (1.905 m)   Wt (!) 313 lb (142 kg)   SpO2 95%   BMI 39.12 kg/m   Assessment and Plan: 1. Essential hypertension Clinically stable exam with well controlled BP. Tolerating medications without side effects at this time. Pt to continue current regimen and low sodium diet; benefits of regular exercise as able discussed.  2. DM type 2, controlled, with complication ([place]) Clinically stable by exam and report without s/s of hypoglycemia. DM complicated by hypertension and dyslipidemia. Treated with diet alone - Hemoglobin A1c - Comprehensive metabolic panel  3. Fatigue, unspecified type Hx of B12 injections as a child - will get level to confirm and  advise - Vitamin B12   Partially dictated using Editor, commissioning. Any errors are unintentional.  Halina Maidens, MD [facility]  08/12/2022

## 2022-08-13 LAB — COMPREHENSIVE METABOLIC PANEL
ALT: 29 IU/L (ref 0–44)
AST: 20 IU/L (ref 0–40)
Albumin/Globulin Ratio: 1.8 (ref 1.2–2.2)
Albumin: 4.8 g/dL (ref 3.9–4.9)
Alkaline Phosphatase: 71 IU/L (ref 44–121)
BUN/Creatinine Ratio: 16 (ref 10–24)
BUN: 15 mg/dL (ref 8–27)
Bilirubin Total: 0.3 mg/dL (ref 0.0–1.2)
CO2: 20 mmol/L (ref 20–29)
Calcium: 9.8 mg/dL (ref 8.6–10.2)
Chloride: 103 mmol/L (ref 96–106)
Creatinine, Ser: 0.94 mg/dL (ref 0.76–1.27)
Globulin, Total: 2.6 g/dL (ref 1.5–4.5)
Glucose: 111 mg/dL — ABNORMAL HIGH (ref 70–99)
Potassium: 4.5 mmol/L (ref 3.5–5.2)
Sodium: 138 mmol/L (ref 134–144)
Total Protein: 7.4 g/dL (ref 6.0–8.5)
eGFR: 88 mL/min/{1.73_m2} (ref >59)

## 2022-08-13 LAB — VITAMIN B12: Vitamin B-12: 402 pg/mL (ref 232–1245)

## 2022-08-13 LAB — HEMOGLOBIN A1C
Est. average glucose Bld gHb Est-mCnc: 140 mg/dL
Hgb A1c MFr Bld: 6.5 % — ABNORMAL HIGH (ref 4.8–5.6)

## 2022-08-29 DIAGNOSIS — L408 Other psoriasis: Secondary | ICD-10-CM | POA: Diagnosis not present

## 2022-08-29 DIAGNOSIS — Z79899 Other long term (current) drug therapy: Secondary | ICD-10-CM | POA: Diagnosis not present

## 2022-08-29 DIAGNOSIS — L309 Dermatitis, unspecified: Secondary | ICD-10-CM | POA: Diagnosis not present

## 2022-09-09 ENCOUNTER — Encounter: Payer: Self-pay | Admitting: Cardiology

## 2022-09-09 ENCOUNTER — Ambulatory Visit: Payer: Medicare Other | Attending: Cardiology | Admitting: Cardiology

## 2022-09-09 VITALS — BP 156/100 | HR 66 | Ht 75.0 in | Wt 309.2 lb

## 2022-09-09 DIAGNOSIS — R600 Localized edema: Secondary | ICD-10-CM

## 2022-09-09 DIAGNOSIS — Z6838 Body mass index (BMI) 38.0-38.9, adult: Secondary | ICD-10-CM

## 2022-09-09 DIAGNOSIS — I10 Essential (primary) hypertension: Secondary | ICD-10-CM

## 2022-09-09 MED ORDER — OZEMPIC (0.25 OR 0.5 MG/DOSE) 2 MG/1.5ML ~~LOC~~ SOPN: PEN_INJECTOR | SUBCUTANEOUS | 1 refills | Status: DC

## 2022-09-09 MED ORDER — HYDROCHLOROTHIAZIDE 12.5 MG PO CAPS: 12.5000 mg | ORAL_CAPSULE | Freq: Every day | ORAL | 3 refills | Status: DC

## 2022-09-09 NOTE — Patient Instructions (Addendum)
Medication Instructions:   Your physician has recommended you make the following change in your medication:    STOP taking your Amlodipine (Norvasc).  2.    START taking Hydrochlorothiazide 12.5 MG once a day.  3.    Start taking Ozempic:  Inject 0.25 MG into skin once a week, for 4 weeks.     Inject 0.50 MG into skin once a week, for 4 weeks.  (Call us for your next refill of the 1 MG dose)     Inject 1.0 MG into skin once a week, for 4 weeks.     Inject 2.0 MG into skin once a week, and continue at this dose.      *If you need a refill on your cardiac medications before your next appointment, please call your pharmacy*   Testing/Procedures:  Your physician has requested that you have an echocardiogram. Echocardiography is a painless test that uses sound waves to create images of your heart. It provides your doctor with information about the size and shape of your heart and how well your heart's chambers and valves are working. This procedure takes approximately one hour. There are no restrictions for this procedure.    Follow-Up: At [facility], you and your health needs are our priority.  As part of our continuing mission to provide you with exceptional heart care, we have created designated Provider Care Teams.  These Care Teams include your primary Cardiologist (physician) and Advanced Practice Providers (APPs -  Physician Assistants and Nurse Practitioners) who all work together to provide you with the care you need, when you need it.  We recommend signing up for the patient portal called "MyChart".  Sign up information is provided on this After Visit Summary.  MyChart is used to connect with patients for Virtual Visits (Telemedicine).  Patients are able to view lab/test results, encounter notes, upcoming appointments, etc.  Non-urgent messages can be sent to your provider as well.   To learn more about what you can do with MyChart, go to [email or web address].     Your next appointment:   6-7 week(s)  The format for your next appointment:   In Person  Provider:    ONLY WITH Debbe Odea, MD     Important Information About Sugar

## 2022-09-09 NOTE — Progress Notes (Signed)
Cardiology Office Note:    Date:  09/09/2022   ID:  Benjamin Medina, DOB April 07, 1951, MRN [id number]  PCP:  Reubin Milan, MD   [facility] Providers Cardiologist:  None     Referring MD: Reubin Milan, MD   Chief Complaint  Patient presents with   Other    Fatigue/edema no complaints today. Meds reviewed verbally with pt.   Benjamin Medina is a 71 y.o. male who is being seen today for the evaluation of edema at the request of Reubin Milan, MD.   History of Present Illness:    Benjamin Medina is a 71 y.o. male with a hx of hypertension, hyperlipidemia, prediabetes who presents due to leg edema.  States having swelling in his legs ongoing over the past 6 to 12 months.  Symptoms usually occur after patient has been on his feet for too long.  Also complains of fatigue and lack of energy.  Denies chest pain.  Smokes apparently previously x7 years.  Takes BP medications as prescribed.  Past Medical History:  Diagnosis Date   Arthritis    Auditory impairment    BPH with urinary obstruction    Contracture of hand    PALMAR FASCIA   Diabetes mellitus without complication (HCC)    Hyperlipidemia    Hypertension    Psoriasis     Past Surgical History:  Procedure Laterality Date   BLEPHAROPLASTY  2016   COLONOSCOPY WITH PROPOFOL N/A 05/01/2016   Procedure: COLONOSCOPY WITH PROPOFOL;  Surgeon: Earline Mayotte, MD;  Location: [facility] ENDOSCOPY;  Service: Endoscopy;  Laterality: N/A;   KNEE ARTHROSCOPY Left 01/14/2019   Procedure: ARTHROSCOPY KNEE WITH DEBRIDEMENT AND PARTIAL MEDIAL MENISCECTOMY;  Surgeon: Christena Flake, MD;  Location: [facility] ORS;  Service: Orthopedics;  Laterality: Left;   KNEE ARTHROSCOPY WITH MEDIAL MENISECTOMY Right 11/27/2016   Procedure: KNEE ARTHROSCOPY WITH MEDIAL MENISECTOMY;  Surgeon: Christena Flake, MD;  [facility];  Service: Orthopedics;  Laterality: Right;   SINUS SURGERY WITH INSTATRAK     VASECTOMY      Current  Medications: Current Meds  Medication Sig   atorvastatin (LIPITOR) 10 MG tablet TAKE 1 TABLET(10 MG) BY MOUTH DAILY   clobetasol (TEMOVATE) 0.05 % GEL Apply 1 application topically 2 (two) times daily.   hydrochlorothiazide (MICROZIDE) 12.5 MG capsule Take 1 capsule (12.5 mg total) by mouth daily.   lisinopril (ZESTRIL) 20 MG tablet TAKE 1 TABLET(10 MG) BY MOUTH DAILY   meloxicam (MOBIC) 7.5 MG tablet TAKE 1 TABLET(7.5 MG) BY MOUTH DAILY   Tildrakizumab-asmn (ILUMYA [place]) Inject into the skin every 3 (three) months.   triamcinolone (KENALOG) 0.025 % cream Apply topically 2 (two) times daily.   [DISCONTINUED] amLODipine (NORVASC) 5 MG tablet TAKE 1 TABLET(5 MG) BY MOUTH DAILY   [DISCONTINUED] Semaglutide,0.25 or 0.5MG /DOS, (OZEMPIC, 0.25 OR 0.5 MG/DOSE,) 2 MG/1.5ML SOPN Inject 0.25 MG into skin once a week for 4 weeks. Then Inject 0.50 MG into skin once a week, for 4 weeks.         Allergies:   Patient has no known allergies.   Social History   Socioeconomic History   Marital status: Married    Spouse name: Not on file   Number of children: 1   Years of education: Not on file   Highest education level: High school graduate  Occupational History   Not on file  Tobacco Use   Smoking status: Former    Types: Cigarettes  Quit date: 12/30/1985    Years since quitting: 36.7   Smokeless tobacco: Never  Vaping Use   Vaping Use: Never used  Substance and Sexual Activity   Alcohol use: Yes    Alcohol/week: 4.0 standard drinks of alcohol    Types: 4 Standard drinks or equivalent per week    Comment: social   Drug use: No   Sexual activity: Yes    Birth control/protection: None  Other Topics Concern   Not on file  Social History Narrative   Works part time as truck Personnel officer Determinants of Corporate investment banker Strain: Low Risk  (03/13/2022)   Overall Financial Resource Strain (CARDIA)    Difficulty of Paying Living Expenses: Not hard at all  Food Insecurity: No  Food Insecurity (03/13/2022)   Hunger Vital Sign    Worried About Running Out of Food in the Last Year: Never true    Ran Out of Food in the Last Year: Never true  Transportation Needs: No Transportation Needs (03/13/2022)   PRAPARE - Administrator, Civil Service (Medical): No    Lack of Transportation (Non-Medical): No  Physical Activity: Insufficiently Active (03/13/2022)   Exercise Vital Sign    Days of Exercise per Week: 3 days    Minutes of Exercise per Session: 30 min  Stress: No Stress Concern Present (03/13/2022)   [employer] of Occupational Health - Occupational Stress Questionnaire    Feeling of Stress : Only a little  Social Connections: Moderately Isolated (03/13/2022)   Social Connection and Isolation Panel [NHANES]    Frequency of Communication with Friends and Family: More than three times a week    Frequency of Social Gatherings with Friends and Family: Twice a week    Attends Religious Services: Never    Database administrator or Organizations: No    Attends Engineer, structural: Never    Marital Status: Married     Family History: The patient's family history includes Colon cancer (age of onset: 56) in his mother; Lymphoma in his father.  ROS:   Please see the history of present illness.     All other systems reviewed and are negative.  EKGs/Labs/Other Studies Reviewed:    The following studies were reviewed today:   EKG:  EKG is  ordered today.  The ekg ordered today demonstrates normal sinus rhythm, normal ECG  Recent Labs: 03/28/2022: Hemoglobin 14.7; Platelets 248 07/09/2022: TSH 1.040 08/12/2022: ALT 29; BUN 15; Creatinine, Ser 0.94; Potassium 4.5; Sodium 138  Recent Lipid Panel    Component Value Date/Time   CHOL 176 03/28/2022 0907   TRIG 143 03/28/2022 0907   HDL 45 03/28/2022 0907   CHOLHDL 3.9 03/28/2022 0907   LDLCALC 106 (H) 03/28/2022 0907     Risk Assessment/Calculations:     HYPERTENSION CONTROL Vitals:    09/09/22 0842 09/09/22 0848  BP: (!) 158/100 (!) 156/100    The patient's blood pressure is elevated above target today.  In order to address the patient's elevated BP: A current anti-hypertensive medication was adjusted today.            Physical Exam:    VS:  BP (!) 156/100 (BP Location: Right Arm, Patient Position: Sitting, Cuff Size: Large)   Pulse 66   Ht 6\' 3"  (1.905 m)   Wt (!) 309 lb 4 oz (140.3 kg)   SpO2 98%   BMI 38.65 kg/m     Wt Readings from Last  3 Encounters:  09/09/22 (!) 309 lb 4 oz (140.3 kg)  08/12/22 (!) 313 lb (142 kg)  08/08/22 (!) 310 lb 3.2 oz (140.7 kg)     GEN:  Well nourished, well developed in no acute distress HEENT: Normal NECK: No JVD; No carotid bruits CARDIAC: RRR, no murmurs, rubs, gallops RESPIRATORY:  Clear to auscultation without rales, wheezing or rhonchi  ABDOMEN: Soft, non-tender, non-distended MUSCULOSKELETAL:  No edema; left ankle varicose veins noted SKIN: Warm and dry NEUROLOGIC:  Alert and oriented x 3 PSYCHIATRIC:  Normal affect   ASSESSMENT:    1. Leg edema   2. Primary hypertension   3. BMI 38.0-38.9,adult    PLAN:    In order of problems listed above:  Leg edema, appears dependent.  Takes Norvasc.  Stop Norvasc, start HCTZ 12.5 mg daily.  Get echocardiogram to evaluate any structural abnormalities. Hypertension, start HCTZ as above, continue lisinopril.  Titrate BP meds as needed for adequate BP control. Obesity, prediabetic/diabetic.  Low-calorie diet, weight loss advised.  Start Ozempic.  Follow-up in 6 weeks after repeat echo     Medication Adjustments/Labs and Tests Ordered: Current medicines are reviewed at length with the patient today.  Concerns regarding medicines are outlined above.  Orders Placed This Encounter  Procedures   ECHOCARDIOGRAM COMPLETE   Meds ordered this encounter  Medications   DISCONTD: Semaglutide,0.25 or 0.5MG /DOS, (OZEMPIC, 0.25 OR 0.5 MG/DOSE,) 2 MG/1.5ML SOPN    Sig: Inject  0.25 MG into skin once a week for 4 weeks. Then Inject 0.50 MG into skin once a week, for 4 weeks.        Dispense:  1.5 mL    Refill:  1   hydrochlorothiazide (MICROZIDE) 12.5 MG capsule    Sig: Take 1 capsule (12.5 mg total) by mouth daily.    Dispense:  30 capsule    Refill:  3   Semaglutide,0.25 or 0.5MG /DOS, (OZEMPIC, 0.25 OR 0.5 MG/DOSE,) 2 MG/1.5ML SOPN    Sig: Inject 0.25 MG into skin once a week for 4 weeks, Then Inject 0.50 MG into skin once a week, for 4 weeks.    Dispense:  1.5 mL    Refill:  1    Patient Instructions  Medication Instructions:   Your physician has recommended you make the following change in your medication:    STOP taking your Amlodipine (Norvasc).  2.    START taking Hydrochlorothiazide 12.5 MG once a day.  3.    Start taking Ozempic:  Inject 0.25 MG into skin once a week, for 4 weeks.     Inject 0.50 MG into skin once a week, for 4 weeks.  (Call us for your next refill of the 1 MG dose)     Inject 1.0 MG into skin once a week, for 4 weeks.     Inject 2.0 MG into skin once a week, and continue at this dose.      *If you need a refill on your cardiac medications before your next appointment, please call your pharmacy*   Testing/Procedures:  Your physician has requested that you have an echocardiogram. Echocardiography is a painless test that uses sound waves to create images of your heart. It provides your doctor with information about the size and shape of your heart and how well your heart's chambers and valves are working. This procedure takes approximately one hour. There are no restrictions for this procedure.    Follow-Up: At [facility], you and your health needs are our  priority.  As part of our continuing mission to provide you with exceptional heart care, we have created designated Provider Care Teams.  These Care Teams include your primary Cardiologist (physician) and Advanced Practice Providers (APPs -  Physician  Assistants and Nurse Practitioners) who all work together to provide you with the care you need, when you need it.  We recommend signing up for the patient portal called "MyChart".  Sign up information is provided on this After Visit Summary.  MyChart is used to connect with patients for Virtual Visits (Telemedicine).  Patients are able to view lab/test results, encounter notes, upcoming appointments, etc.  Non-urgent messages can be sent to your provider as well.   To learn more about what you can do with MyChart, go to [email or web address].    Your next appointment:   6-7 week(s)  The format for your next appointment:   In Person  Provider:    ONLY WITH Kate Sable, MD     Important Information About Sugar         Signed, Kate Sable, MD  09/09/2022 9:43 AM    [place]

## 2022-09-10 ENCOUNTER — Telehealth: Payer: Self-pay

## 2022-09-10 NOTE — Telephone Encounter (Signed)
Prior authorization initiated in [email or web address]  KEY: [id number] Response: Your information has been submitted to [facility] [place]. [employer] [place] will review the request and notify you of the determination decision directly, typically within 3 business days of your submission and once all necessary information is received.  You will also receive your request decision electronically. To check for an update later, open the request again from your dashboard.  If [employer] [place] has not responded within the specified timeframe or if you have any questions about your PA submission, contact Blue Cross [place] directly at [facility]) [phone] or (PDP) [phone].  Wait for Determination Please wait for [facility] to return a determination.

## 2022-09-11 NOTE — Telephone Encounter (Signed)
Per fax received from [facility] Notice of Approval for Medicare prescription Drug, Ozempic 0.25 or 0.5 mg/dose has been approved. The authorization for coverage of the drug will end on 09/11/23.

## 2022-09-13 NOTE — Addendum Note (Signed)
Addended by: Kendrick Fries on: 09/13/2022 08:09 AM   Modules accepted: Orders

## 2022-09-16 DIAGNOSIS — M1711 Unilateral primary osteoarthritis, right knee: Secondary | ICD-10-CM | POA: Diagnosis not present

## 2022-09-19 ENCOUNTER — Other Ambulatory Visit: Payer: Self-pay | Admitting: Internal Medicine

## 2022-09-19 DIAGNOSIS — I10 Essential (primary) hypertension: Secondary | ICD-10-CM

## 2022-09-19 DIAGNOSIS — E1169 Type 2 diabetes mellitus with other specified complication: Secondary | ICD-10-CM

## 2022-09-19 NOTE — Telephone Encounter (Signed)
Requested Prescriptions  Pending Prescriptions Disp Refills  . lisinopril (ZESTRIL) 20 MG tablet [Pharmacy Med Name: LISINOPRIL 20MG  TABLETS] 90 tablet 1    Sig: TAKE 1 TABLET BY MOUTH EVERY DAY     Cardiovascular:  ACE Inhibitors Failed - 09/19/2022  7:16 AM      Failed - Last BP in normal range    BP Readings from Last 1 Encounters:  09/09/22 (!) 156/100         Passed - Cr in normal range and within 180 days    Creatinine, Ser  Date Value Ref Range Status  08/12/2022 0.94 0.76 - 1.27 mg/dL Final         Passed - K in normal range and within 180 days    Potassium  Date Value Ref Range Status  08/12/2022 4.5 3.5 - 5.2 mmol/L Final         Passed - Patient is not pregnant      Passed - Valid encounter within last 6 months    Recent Outpatient Visits          1 month ago Essential hypertension   [place] at [facility], Jesse Sans, MD   2 months ago Fatigue, unspecified type   [facility] at [place], [place], MD   5 months ago Annual physical exam   [facility] at [facility], Jesse Sans, MD   9 months ago COVID-19 virus infection   [facility] Care and Sports Medicine at [facility], Jesse Sans, MD   9 months ago Gross hematuria   [facility] at [facility], Jesse Sans, MD      Future Appointments            In 1 month Agbor-Etang, Aaron Edelman, MD [place] [place]   In 2 months Army Melia, Jesse Sans, MD [facility] Primary Care and Sports Medicine at [facility], [facility]   In 5 months Army Melia, Jesse Sans, MD [facility] Primary Care and Sports Medicine at [facility], [facility]           . atorvastatin (LIPITOR) 10 MG tablet [Pharmacy Med Name: ATORVASTATIN 10MG  TABLETS] 90 tablet 0    Sig: TAKE 1 TABLET(10 MG) BY MOUTH DAILY      Cardiovascular:  Antilipid - Statins Failed - 09/19/2022  7:16 AM      Failed - Lipid Panel in normal range within the last 12 months    Cholesterol, Total  Date Value Ref Range Status  03/28/2022 176 100 - 199 mg/dL Final   LDL Chol Calc (NIH)  Date Value Ref Range Status  03/28/2022 106 (H) 0 - 99 mg/dL Final   HDL  Date Value Ref Range Status  03/28/2022 45 >39 mg/dL Final   Triglycerides  Date Value Ref Range Status  03/28/2022 143 0 - 149 mg/dL Final         Passed - Patient is not pregnant      Passed - Valid encounter within last 12 months    Recent Outpatient Visits          1 month ago Essential hypertension   [place] [facility] at [facility], Jesse Sans, MD   2 months ago Fatigue, unspecified type   [facility] at [facility], MD   5 months ago Annual physical exam   [facility] and [facility] Medicine at [facility], Nyoka Cowden, MD   9 months ago COVID-19 virus infection   [facility] at [facility], Nyoka Cowden, MD   9 months ago Gross hematuria   [facility] at [facility], Nyoka Cowden, MD      Future Appointments            In 1 month Agbor-Etang, Arlys John, MD [facility] [place]   In 2 months Judithann Graves, Nyoka Cowden, MD [facility] Primary Care and Sports Medicine at [facility], [facility]   In 5 months Judithann Graves, Nyoka Cowden, MD [facility] Primary Care and Sports Medicine at [facility], [facility]

## 2022-10-17 ENCOUNTER — Ambulatory Visit: Payer: Medicare Other | Attending: Cardiology

## 2022-10-17 DIAGNOSIS — R600 Localized edema: Secondary | ICD-10-CM | POA: Diagnosis not present

## 2022-10-17 LAB — ECHOCARDIOGRAM COMPLETE
AR max vel: 3.89 cm2
AV Area VTI: 3.75 cm2
AV Area mean vel: 3.76 cm2
AV Mean grad: 5 mmHg
AV Peak grad: 9 mmHg
AV Vena cont: 0.5 cm
Ao pk vel: 1.5 m/s
Area-P 1/2: 2.73 cm2
S' Lateral: 2.7 cm

## 2022-10-17 MED ORDER — PERFLUTREN LIPID MICROSPHERE
1.0000 mL | INTRAVENOUS | Status: AC | PRN
  Administered 2022-10-17: 2 mL via INTRAVENOUS

## 2022-10-18 ENCOUNTER — Telehealth: Payer: Self-pay | Admitting: Cardiology

## 2022-10-18 MED ORDER — OZEMPIC (0.25 OR 0.5 MG/DOSE) 2 MG/1.5ML ~~LOC~~ SOPN: PEN_INJECTOR | SUBCUTANEOUS | 1 refills | Status: DC

## 2022-10-18 NOTE — Telephone Encounter (Signed)
*  STAT* If patient is at the pharmacy, call can be transferred to refill team.   1. Which medications need to be refilled? (please list name of each medication and dose if known)   Semaglutide,0.25 or 0.5MG /DOS, (OZEMPIC, 0.25 OR 0.5 MG/DOSE,) 2 MG/1.5ML SOPN  2. Which pharmacy/location (including street and city if local pharmacy) is medication to be sent to?  [facility] [place], [address] AT [facility] [address]  3. Do they need a 30 day or 90 day supply? 90 day  Patient stated he is completely out of needles for his Ozempic and he will be out of the medication soon as well.

## 2022-10-28 ENCOUNTER — Encounter (INDEPENDENT_AMBULATORY_CARE_PROVIDER_SITE_OTHER): Payer: Self-pay

## 2022-10-31 ENCOUNTER — Encounter: Payer: Self-pay | Admitting: Cardiology

## 2022-10-31 ENCOUNTER — Ambulatory Visit: Payer: Medicare Other | Attending: Cardiology | Admitting: Cardiology

## 2022-10-31 VITALS — BP 142/92 | HR 79 | Ht 76.0 in | Wt 305.8 lb

## 2022-10-31 DIAGNOSIS — I10 Essential (primary) hypertension: Secondary | ICD-10-CM | POA: Diagnosis not present

## 2022-10-31 DIAGNOSIS — R600 Localized edema: Secondary | ICD-10-CM | POA: Diagnosis not present

## 2022-10-31 DIAGNOSIS — Z6837 Body mass index (BMI) 37.0-37.9, adult: Secondary | ICD-10-CM

## 2022-10-31 MED ORDER — LISINOPRIL 40 MG PO TABS: ORAL_TABLET | ORAL | 3 refills | Status: DC

## 2022-10-31 NOTE — Patient Instructions (Signed)
Medication Instructions:   Your physician has recommended you make the following change in your medication:   INCREASE your Lisinopril to 40 MG once a day.   *If you need a refill on your cardiac medications before your next appointment, please call your pharmacy*    Follow-Up: At [facility], you and your health needs are our priority.  As part of our continuing mission to provide you with exceptional heart care, we have created designated Provider Care Teams.  These Care Teams include your primary Cardiologist (physician) and Advanced Practice Providers (APPs -  Physician Assistants and Nurse Practitioners) who all work together to provide you with the care you need, when you need it.  We recommend signing up for the patient portal called "MyChart".  Sign up information is provided on this After Visit Summary.  MyChart is used to connect with patients for Virtual Visits (Telemedicine).  Patients are able to view lab/test results, encounter notes, upcoming appointments, etc.  Non-urgent messages can be sent to your provider as well.   To learn more about what you can do with MyChart, go to [email or web address].    Your next appointment:   3-4 month(s)  The format for your next appointment:   In Person  Provider:    ONLY WITH Kate Sable, MD    Other Instructions   Important Information About Sugar

## 2022-10-31 NOTE — Progress Notes (Signed)
Cardiology Office Note:    Date:  10/31/2022   ID:  Benjamin Medina, DOB Aug 25, 1951, MRN [id number]  PCP:  Glean Hess, MD   [place] Providers Cardiologist:  None     Referring MD: Glean Hess, MD   Chief Complaint  Patient presents with   Follow-up    6-8 week follow up     History of Present Illness:    Benjamin Medina is a 71 y.o. male with a hx of hypertension, hyperlipidemia, prediabetes who presents for follow-up.  Previously seen due to leg edema.  Patient was on HCTZ, echocardiogram obtained to evaluate any significant structural abnormalities.  Ozempic also started to help for both blood sugar and weight control.  Edema is much controlled, he does not check his BP frequently at home.  Feels well, has lost some weight since starting Ozempic.  Prior notes/studies Echo 10/17/2022 EF 60 to 65%, impaired relaxation.  Past Medical History:  Diagnosis Date   Arthritis    Auditory impairment    BPH with urinary obstruction    Contracture of hand    PALMAR FASCIA   Diabetes mellitus without complication ([place])    Hyperlipidemia    Hypertension    Psoriasis     Past Surgical History:  Procedure Laterality Date   BLEPHAROPLASTY  2016   COLONOSCOPY WITH PROPOFOL N/A 05/01/2016   Procedure: COLONOSCOPY WITH PROPOFOL;  Surgeon: Robert Bellow, MD;  Location: [facility] ENDOSCOPY;  Service: Endoscopy;  Laterality: N/A;   KNEE ARTHROSCOPY Left 01/14/2019   Procedure: ARTHROSCOPY KNEE WITH DEBRIDEMENT AND PARTIAL MEDIAL MENISCECTOMY;  Surgeon: Corky Mull, MD;  Location: [facility] ORS;  Service: Orthopedics;  Laterality: Left;   KNEE ARTHROSCOPY WITH MEDIAL MENISECTOMY Right 11/27/2016   Procedure: KNEE ARTHROSCOPY WITH MEDIAL MENISECTOMY;  Surgeon: Corky Mull, MD;  Location: [place];  Service: Orthopedics;  Laterality: Right;   SINUS SURGERY WITH INSTATRAK     VASECTOMY      Current Medications: Current Meds  Medication Sig    atorvastatin (LIPITOR) 10 MG tablet TAKE 1 TABLET(10 MG) BY MOUTH DAILY   clobetasol (TEMOVATE) 0.05 % GEL Apply 1 application topically 2 (two) times daily.   hydrochlorothiazide (MICROZIDE) 12.5 MG capsule Take 1 capsule (12.5 mg total) by mouth daily.   meloxicam (MOBIC) 7.5 MG tablet TAKE 1 TABLET(7.5 MG) BY MOUTH DAILY   Semaglutide,0.25 or 0.5MG /DOS, (OZEMPIC, 0.25 OR 0.5 MG/DOSE,) 2 MG/1.5ML SOPN Inject 0.25 MG into skin once a week for 4 weeks, Then Inject 0.50 MG into skin once a week, for 4 weeks.   Tildrakizumab-asmn (ILUMYA [place]) Inject into the skin every 3 (three) months.   triamcinolone (KENALOG) 0.025 % cream Apply topically 2 (two) times daily.   [DISCONTINUED] lisinopril (ZESTRIL) 20 MG tablet TAKE 1 TABLET BY MOUTH EVERY DAY     Allergies:   Patient has no known allergies.   Social History   Socioeconomic History   Marital status: Married    Spouse name: Not on file   Number of children: 1   Years of education: Not on file   Highest education level: High school graduate  Occupational History   Not on file  Tobacco Use   Smoking status: Former    Types: Cigarettes    Quit date: 12/30/1985    Years since quitting: 36.8   Smokeless tobacco: Never  Vaping Use   Vaping Use: Never used  Substance and Sexual Activity   Alcohol use: Yes  Alcohol/week: 4.0 standard drinks of alcohol    Types: 4 Standard drinks or equivalent per week    Comment: social   Drug use: No   Sexual activity: Yes    Birth control/protection: None  Other Topics Concern   Not on file  Social History Narrative   Works part time as truck Personnel officer Determinants of Corporate investment banker Strain: Low Risk  (03/13/2022)   Overall Financial Resource Strain (CARDIA)    Difficulty of Paying Living Expenses: Not hard at all  Food Insecurity: No Food Insecurity (03/13/2022)   Hunger Vital Sign    Worried About Running Out of Food in the Last Year: Never true    Ran Out of Food in the  Last Year: Never true  Transportation Needs: No Transportation Needs (03/13/2022)   PRAPARE - Administrator, Civil Service (Medical): No    Lack of Transportation (Non-Medical): No  Physical Activity: Insufficiently Active (03/13/2022)   Exercise Vital Sign    Days of Exercise per Week: 3 days    Minutes of Exercise per Session: 30 min  Stress: No Stress Concern Present (03/13/2022)   [employer] of Occupational Health - Occupational Stress Questionnaire    Feeling of Stress : Only a little  Social Connections: Moderately Isolated (03/13/2022)   Social Connection and Isolation Panel [NHANES]    Frequency of Communication with Friends and Family: More than three times a week    Frequency of Social Gatherings with Friends and Family: Twice a week    Attends Religious Services: Never    Database administrator or Organizations: No    Attends Engineer, structural: Never    Marital Status: Married     Family History: The patient's family history includes Colon cancer (age of onset: 14) in his mother; Lymphoma in his father.  ROS:   Please see the history of present illness.     All other systems reviewed and are negative.  EKGs/Labs/Other Studies Reviewed:    The following studies were reviewed today:   EKG:  EKG not ordered today.    Recent Labs: 03/28/2022: Hemoglobin 14.7; Platelets 248 07/09/2022: TSH 1.040 08/12/2022: ALT 29; BUN 15; Creatinine, Ser 0.94; Potassium 4.5; Sodium 138  Recent Lipid Panel    Component Value Date/Time   CHOL 176 03/28/2022 0907   TRIG 143 03/28/2022 0907   HDL 45 03/28/2022 0907   CHOLHDL 3.9 03/28/2022 0907   LDLCALC 106 (H) 03/28/2022 0907     Risk Assessment/Calculations:         Physical Exam:    VS:  BP (!) 142/92 (BP Location: Left Arm, Patient Position: Sitting, Cuff Size: Large)   Pulse 79   Ht 6\' 4"  (1.93 m)   Wt (!) 305 lb 12.8 oz (138.7 kg)   SpO2 97%   BMI 37.22 kg/m     Wt Readings from Last 3  Encounters:  10/31/22 (!) 305 lb 12.8 oz (138.7 kg)  09/09/22 (!) 309 lb 4 oz (140.3 kg)  08/12/22 (!) 313 lb (142 kg)     GEN:  Well nourished, well developed in no acute distress HEENT: Normal NECK: No JVD; No carotid bruits CARDIAC: RRR, no murmurs, rubs, gallops RESPIRATORY:  Clear to auscultation without rales, wheezing or rhonchi  ABDOMEN: Soft, non-tender, non-distended MUSCULOSKELETAL:  No edema; left ankle varicose veins noted SKIN: Warm and dry NEUROLOGIC:  Alert and oriented x 3 PSYCHIATRIC:  Normal affect   ASSESSMENT:  1. Leg edema   2. Primary hypertension   3. BMI 37.0-37.9, adult   4. Essential hypertension     PLAN:    In order of problems listed above:  Leg edema, currently resolved, echo shows normal systolic function EF 60 to 65%.  Etiology likely from varicose veins, taking Norvasc.  Hypertension, BP is elevated.  Increase lisinopril to 40 mg daily, continue HCTZ 12.5 mg daily.  Obesity, prediabetic/diabetic.  Lost 4 pounds since last visit, continue Ozempic.  Continue low calorie diet with exercise  Follow-up in 3 to 4 months     Medication Adjustments/Labs and Tests Ordered: Current medicines are reviewed at length with the patient today.  Concerns regarding medicines are outlined above.  No orders of the defined types were placed in this encounter.  Meds ordered this encounter  Medications   lisinopril (ZESTRIL) 40 MG tablet    Sig: TAKE 1 TABLET BY MOUTH EVERY DAY    Dispense:  30 tablet    Refill:  3    Patient Instructions  Medication Instructions:   Your physician has recommended you make the following change in your medication:   INCREASE your Lisinopril to 40 MG once a day.   *If you need a refill on your cardiac medications before your next appointment, please call your pharmacy*    Follow-Up: At [facility], you and your health needs are our priority.  As part of our continuing mission to provide you with  exceptional heart care, we have created designated Provider Care Teams.  These Care Teams include your primary Cardiologist (physician) and Advanced Practice Providers (APPs -  Physician Assistants and Nurse Practitioners) who all work together to provide you with the care you need, when you need it.  We recommend signing up for the patient portal called "MyChart".  Sign up information is provided on this After Visit Summary.  MyChart is used to connect with patients for Virtual Visits (Telemedicine).  Patients are able to view lab/test results, encounter notes, upcoming appointments, etc.  Non-urgent messages can be sent to your provider as well.   To learn more about what you can do with MyChart, go to [email or web address].    Your next appointment:   3-4 month(s)  The format for your next appointment:   In Person  Provider:    ONLY WITH Debbe Odea, MD    Other Instructions   Important Information About Sugar         Signed, Debbe Odea, MD  10/31/2022 10:47 AM    [facility]

## 2022-11-12 ENCOUNTER — Other Ambulatory Visit: Payer: Self-pay

## 2022-11-12 MED ORDER — OZEMPIC (2 MG/DOSE) 8 MG/3ML ~~LOC~~ SOPN: 2.0000 mg | PEN_INJECTOR | SUBCUTANEOUS | 1 refills | Status: AC

## 2022-11-12 MED ORDER — SEMAGLUTIDE (1 MG/DOSE) 4 MG/3ML ~~LOC~~ SOPN: 1.0000 mg | PEN_INJECTOR | SUBCUTANEOUS | 0 refills | Status: AC

## 2022-11-13 ENCOUNTER — Other Ambulatory Visit: Payer: Self-pay | Admitting: Surgery

## 2022-11-19 ENCOUNTER — Encounter
Admission: RE | Admit: 2022-11-19 | Discharge: 2022-11-19 | Disposition: A | Discharge status: Home / Self Care | Payer: Medicare Other | Source: Ambulatory Visit | Attending: Surgery | Admitting: Surgery

## 2022-11-19 VITALS — BP 150/89 | HR 85 | Temp 98.1°F | Resp 20 | Ht 76.0 in | Wt 305.6 lb

## 2022-11-19 DIAGNOSIS — Z01818 Encounter for other preprocedural examination: Secondary | ICD-10-CM | POA: Diagnosis not present

## 2022-11-19 DIAGNOSIS — Z01812 Encounter for preprocedural laboratory examination: Secondary | ICD-10-CM

## 2022-11-19 DIAGNOSIS — E118 Type 2 diabetes mellitus with unspecified complications: Secondary | ICD-10-CM

## 2022-11-19 LAB — COMPREHENSIVE METABOLIC PANEL
ALT: 29 U/L (ref 0–44)
AST: 26 U/L (ref 15–41)
Albumin: 4.3 g/dL (ref 3.5–5.0)
Alkaline Phosphatase: 63 U/L (ref 38–126)
Anion gap: 8 (ref 5–15)
BUN: 17 mg/dL (ref 8–23)
CO2: 20 mmol/L — ABNORMAL LOW (ref 22–32)
Calcium: 9.5 mg/dL (ref 8.9–10.3)
Chloride: 109 mmol/L (ref 98–111)
Creatinine, Ser: 0.98 mg/dL (ref 0.61–1.24)
GFR, Estimated: >60 mL/min (ref >60)
Glucose, Bld: 166 mg/dL — ABNORMAL HIGH (ref 70–99)
Potassium: 3.9 mmol/L (ref 3.5–5.1)
Sodium: 137 mmol/L (ref 135–145)
Total Bilirubin: 0.7 mg/dL (ref 0.3–1.2)
Total Protein: 7.8 g/dL (ref 6.5–8.1)

## 2022-11-19 LAB — URINALYSIS, ROUTINE W REFLEX MICROSCOPIC
Bacteria, UA: NONE SEEN
Bilirubin Urine: NEGATIVE
Glucose, UA: NEGATIVE mg/dL
Hgb urine dipstick: NEGATIVE
Ketones, ur: NEGATIVE mg/dL
Leukocytes,Ua: NEGATIVE
Nitrite: NEGATIVE
Protein, ur: 30 mg/dL — AB
Specific Gravity, Urine: 1.021 (ref 1.005–1.030)
pH: 5 (ref 5.0–8.0)

## 2022-11-19 LAB — TYPE AND SCREEN
ABO/RH(D): O POS
Antibody Screen: NEGATIVE

## 2022-11-19 LAB — CBC WITH DIFFERENTIAL/PLATELET
Abs Immature Granulocytes: 0.02 10*3/uL (ref 0.00–0.07)
Basophils Absolute: 0.1 10*3/uL (ref 0.0–0.1)
Basophils Relative: 1 %
Eosinophils Absolute: 0.3 10*3/uL (ref 0.0–0.5)
Eosinophils Relative: 4 %
HCT: 43.5 % (ref 39.0–52.0)
Hemoglobin: 14.7 g/dL (ref 13.0–17.0)
Immature Granulocytes: 0 %
Lymphocytes Relative: 22 %
Lymphs Abs: 1.7 10*3/uL (ref 0.7–4.0)
MCH: 30.9 pg (ref 26.0–34.0)
MCHC: 33.8 g/dL (ref 30.0–36.0)
MCV: 91.6 fL (ref 80.0–100.0)
Monocytes Absolute: 0.5 10*3/uL (ref 0.1–1.0)
Monocytes Relative: 6 %
Neutro Abs: 5.2 10*3/uL (ref 1.7–7.7)
Neutrophils Relative %: 67 %
Platelets: 270 10*3/uL (ref 150–400)
RBC: 4.75 MIL/uL (ref 4.22–5.81)
RDW: 12.8 % (ref 11.5–15.5)
WBC: 7.7 10*3/uL (ref 4.0–10.5)
nRBC: 0 % (ref 0.0–0.2)

## 2022-11-19 LAB — SURGICAL PCR SCREEN
MRSA, PCR: NEGATIVE
Staphylococcus aureus: NEGATIVE

## 2022-11-19 NOTE — Patient Instructions (Addendum)
Your procedure is scheduled on: Thursday November 28, 2022. Report to Day Surgery inside [facility] 2nd floor, stop by registration desk before getting on elevator.  To find out your arrival time please call [phone] between 1PM - 3PM on Wednesday November 27, 2022.  Remember: Instructions that are not followed completely may result in serious medical risk,  up to and including death, or upon the discretion of your surgeon and anesthesiologist your  surgery may need to be rescheduled.     _X__ 1. Do not eat food after midnight the night before your procedure.                 No chewing gum or hard candies. You may drink clear liquids up to 2 hours                 before you are scheduled to arrive for your surgery- DO not drink clear                 liquids within 2 hours of the start of your surgery.                 Clear Liquids include:  water, apple juice without pulp, clear Gatorade, G2 or                  Gatorade Zero (avoid Red/Purple/Blue), Black Coffee or Tea (Do not add                 anything to coffee or tea).  __X__2.   Complete the "Ensure Clear Pre-surgery Clear Carbohydrate Drink" provided to you, 2 hours before arrival. **If you       are diabetic you will be provided with an alternative drink, Gatorade Zero or G2.  __X__3.  On the morning of surgery brush your teeth with toothpaste and water, you                may rinse your mouth with mouthwash if you wish.  Do not swallow any toothpaste or mouthwash.     _X__ 4.  No Alcohol for 24 hours before or after surgery.   _X__ 5.  Do Not Smoke or use e-cigarettes For 24 Hours Prior to Your Surgery.                 Do not use any chewable tobacco products for at least 6 hours prior to                 Surgery.  _X__  6.  Do not use any recreational drugs (marijuana, cocaine, heroin, ecstasy, MDMA or other)                For at least one week prior to your surgery.  Combination of these drugs  with anesthesia                May have life threatening results.  ____  7  Bring all medications with you on the day of surgery if instructed.   __x__8.  Notify your doctor if there is any change in your medical condition      (cold, fever, infections).     Do not wear jewelry, make-up, hairpins, clips or nail polish. Do not wear lotions, powders, or perfumes. You may wear deodorant. Do not shave 48 hours prior to surgery. Men may shave face and neck. Do not bring valuables to the hospital.    [facility] is not responsible for  any belongings or valuables.  Contacts, dentures or bridgework may not be worn into surgery. Leave your suitcase in the car. After surgery it may be brought to your room. For patients admitted to the hospital, discharge time is determined by your treatment team.   Patients discharged the day of surgery will not be allowed to drive home.   Make arrangements for someone to be with you for the first 24 hours of your Same Day Discharge.   __X__ Take these medicines the morning of surgery with A SIP OF WATER:    1.atorvastatin (LIPITOR) 10 MG   2.   3.   4.  5.  6.  ____ Fleet Enema (as directed)   __X__ Use CHG Soap (or wipes) as directed  ____ Use Benzoyl Peroxide Gel as instructed  ____ Use inhalers on the day of surgery  __X__ Stop Semaglutide, 2 MG/DOSE, (OZEMPIC, 2 MG/DOSE,) 8 MG/3ML SOPN 7 days prior to surgery    ____ Take 1/2 of usual insulin dose the night before surgery. No insulin the morning          of surgery.   ____ Call your PCP, cardiologist, or Pulmonologist if taking Coumadin/Plavix/aspirin and ask when to stop before your surgery.   __X__ One Week prior to surgery- Stop Anti-inflammatories such as Ibuprofen, Aleve, Advil, Motrin, meloxicam (MOBIC), diclofenac, etodolac, ketorolac, Toradol, Daypro, piroxicam, Goody's or BC powders. OK TO USE TYLENOL IF NEEDED   __X__ One week prior to surgery stop supplements until after  surgery.    ____ Bring C-Pap to the hospital.    If you have any questions regarding your pre-procedure instructions,  Please call Pre-admit Testing at [phone]     Preparing for Surgery with CHLORHEXIDINE GLUCONATE (CHG) Soap  Chlorhexidine Gluconate (CHG) Soap  o An antiseptic cleaner that kills germs and bonds with the skin to continue killing germs even after washing  o Used for showering the night before surgery and morning of surgery  Before surgery, you can play an important role by reducing the number of germs on your skin.  CHG (Chlorhexidine gluconate) soap is an antiseptic cleanser which kills germs and bonds with the skin to continue killing germs even after washing.  Please do not use if you have an allergy to CHG or antibacterial soaps. If your skin becomes reddened/irritated stop using the CHG.  1. Shower the NIGHT BEFORE SURGERY and the MORNING OF SURGERY with CHG soap.  2. If you choose to wash your hair, wash your hair first as usual with your normal shampoo.  3. After shampooing, rinse your hair and body thoroughly to remove the shampoo.  4. Use CHG as you would any other liquid soap. You can apply CHG directly to the skin and wash gently with a scrungie or a clean washcloth.  5. Apply the CHG soap to your body only from the neck down. Do not use on open wounds or open sores. Avoid contact with your eyes, ears, mouth, and genitals (private parts). Wash face and genitals (private parts) with your normal soap.  6. Wash thoroughly, paying special attention to the area where your surgery will be performed.  7. Thoroughly rinse your body with warm water.  8. Do not shower/wash with your normal soap after using and rinsing off the CHG soap.  9. Pat yourself dry with a clean towel.  10. Wear clean pajamas to bed the night before surgery.  12. Place clean sheets on your bed the night of your  first shower and do not sleep with pets.  13. Shower again with  the CHG soap on the day of surgery prior to arriving at the hospital.  14. Do not apply any deodorants/lotions/powders.  15. Please wear clean clothes to the hospital.

## 2022-11-25 DIAGNOSIS — M1711 Unilateral primary osteoarthritis, right knee: Secondary | ICD-10-CM | POA: Diagnosis not present

## 2022-11-28 ENCOUNTER — Encounter: Payer: Self-pay | Admitting: Surgery

## 2022-11-28 ENCOUNTER — Encounter
Admission: RE | Disposition: A | Discharge status: Home / Self Care | Payer: Self-pay | Source: Home / Self Care | Attending: Surgery

## 2022-11-28 ENCOUNTER — Ambulatory Visit
Admission: RE | Admit: 2022-11-28 | Discharge: 2022-11-28 | Disposition: A | Discharge status: Home / Self Care | Payer: Medicare Other | Attending: Surgery | Admitting: Surgery

## 2022-11-28 ENCOUNTER — Other Ambulatory Visit: Payer: Self-pay

## 2022-11-28 ENCOUNTER — Ambulatory Visit: Payer: Medicare Other | Admitting: Urgent Care

## 2022-11-28 ENCOUNTER — Ambulatory Visit: Payer: Medicare Other

## 2022-11-28 DIAGNOSIS — Z87891 Personal history of nicotine dependence: Secondary | ICD-10-CM | POA: Insufficient documentation

## 2022-11-28 DIAGNOSIS — I10 Essential (primary) hypertension: Secondary | ICD-10-CM | POA: Diagnosis not present

## 2022-11-28 DIAGNOSIS — E785 Hyperlipidemia, unspecified: Secondary | ICD-10-CM | POA: Diagnosis not present

## 2022-11-28 DIAGNOSIS — Z6837 Body mass index (BMI) 37.0-37.9, adult: Secondary | ICD-10-CM | POA: Insufficient documentation

## 2022-11-28 DIAGNOSIS — E119 Type 2 diabetes mellitus without complications: Secondary | ICD-10-CM | POA: Diagnosis not present

## 2022-11-28 DIAGNOSIS — Z79899 Other long term (current) drug therapy: Secondary | ICD-10-CM | POA: Diagnosis not present

## 2022-11-28 DIAGNOSIS — M1711 Unilateral primary osteoarthritis, right knee: Secondary | ICD-10-CM | POA: Insufficient documentation

## 2022-11-28 DIAGNOSIS — L409 Psoriasis, unspecified: Secondary | ICD-10-CM | POA: Diagnosis not present

## 2022-11-28 DIAGNOSIS — E669 Obesity, unspecified: Secondary | ICD-10-CM | POA: Diagnosis not present

## 2022-11-28 DIAGNOSIS — R600 Localized edema: Secondary | ICD-10-CM | POA: Diagnosis not present

## 2022-11-28 DIAGNOSIS — Z7985 Long-term (current) use of injectable non-insulin antidiabetic drugs: Secondary | ICD-10-CM | POA: Diagnosis not present

## 2022-11-28 DIAGNOSIS — Z96651 Presence of right artificial knee joint: Secondary | ICD-10-CM | POA: Diagnosis not present

## 2022-11-28 DIAGNOSIS — Z471 Aftercare following joint replacement surgery: Secondary | ICD-10-CM | POA: Diagnosis not present

## 2022-11-28 DIAGNOSIS — E118 Type 2 diabetes mellitus with unspecified complications: Secondary | ICD-10-CM

## 2022-11-28 HISTORY — PX: TOTAL KNEE ARTHROPLASTY: SHX125

## 2022-11-28 LAB — GLUCOSE, CAPILLARY
Glucose-Capillary: 133 mg/dL — ABNORMAL HIGH (ref 70–99)
Glucose-Capillary: 149 mg/dL — ABNORMAL HIGH (ref 70–99)

## 2022-11-28 LAB — ABO/RH: ABO/RH(D): O POS

## 2022-11-28 SURGERY — ARTHROPLASTY, KNEE, TOTAL
Anesthesia: General | Site: Knee | Laterality: Right

## 2022-11-28 MED ORDER — OXYCODONE HCL 5 MG PO TABS
5.0000 mg | ORAL_TABLET | ORAL | Status: DC | PRN
  Administered 2022-11-28 (×2): 10 mg via ORAL

## 2022-11-28 MED ORDER — OXYCODONE HCL 5 MG PO TABS
ORAL_TABLET | ORAL | Status: AC
  Filled 2022-11-28: qty 2

## 2022-11-28 MED ORDER — EPHEDRINE SULFATE (PRESSORS) 50 MG/ML IJ SOLN
INTRAMUSCULAR | Status: DC | PRN
  Administered 2022-11-28: 10 mg via INTRAVENOUS

## 2022-11-28 MED ORDER — SODIUM CHLORIDE FLUSH 0.9 % IV SOLN
INTRAVENOUS | Status: AC
  Filled 2022-11-28: qty 40

## 2022-11-28 MED ORDER — METOCLOPRAMIDE HCL 5 MG/ML IJ SOLN: 5.0000 mg | Freq: Three times a day (TID) | INTRAMUSCULAR | Status: DC | PRN

## 2022-11-28 MED ORDER — FENTANYL CITRATE (PF) 250 MCG/5ML IJ SOLN
INTRAMUSCULAR | Status: AC
  Filled 2022-11-28: qty 5

## 2022-11-28 MED ORDER — OXYCODONE HCL 5 MG PO TABS: 5.0000 mg | ORAL_TABLET | ORAL | 0 refills | Status: DC | PRN

## 2022-11-28 MED ORDER — BUPIVACAINE-EPINEPHRINE (PF) 0.5% -1:200000 IJ SOLN
INTRAMUSCULAR | Status: AC
  Filled 2022-11-28: qty 30

## 2022-11-28 MED ORDER — CEFAZOLIN IN SODIUM CHLORIDE 3-0.9 GM/100ML-% IV SOLN
3.0000 g | Freq: Four times a day (QID) | INTRAVENOUS | Status: DC
  Administered 2022-11-28: 3 g via INTRAVENOUS
  Filled 2022-11-28: qty 100

## 2022-11-28 MED ORDER — FENTANYL CITRATE (PF) 100 MCG/2ML IJ SOLN
INTRAMUSCULAR | Status: AC
  Administered 2022-11-28: 25 ug via INTRAVENOUS
  Filled 2022-11-28: qty 2

## 2022-11-28 MED ORDER — KETOROLAC TROMETHAMINE 15 MG/ML IJ SOLN: 15.0000 mg | Freq: Once | INTRAMUSCULAR | Status: AC

## 2022-11-28 MED ORDER — ROCURONIUM BROMIDE 100 MG/10ML IV SOLN
INTRAVENOUS | Status: DC | PRN
  Administered 2022-11-28: 80 mg via INTRAVENOUS

## 2022-11-28 MED ORDER — ONDANSETRON HCL 4 MG/2ML IJ SOLN
INTRAMUSCULAR | Status: DC | PRN
  Administered 2022-11-28: 4 mg via INTRAVENOUS

## 2022-11-28 MED ORDER — ONDANSETRON HCL 4 MG/2ML IJ SOLN: 4.0000 mg | Freq: Once | INTRAMUSCULAR | Status: DC | PRN

## 2022-11-28 MED ORDER — 0.9 % SODIUM CHLORIDE (POUR BTL) OPTIME
TOPICAL | Status: DC | PRN
  Administered 2022-11-28: 500 mL

## 2022-11-28 MED ORDER — APIXABAN 2.5 MG PO TABS: 2.5000 mg | ORAL_TABLET | Freq: Two times a day (BID) | ORAL | 0 refills | Status: DC

## 2022-11-28 MED ORDER — EPHEDRINE 5 MG/ML INJ
INTRAVENOUS | Status: AC
  Filled 2022-11-28: qty 5

## 2022-11-28 MED ORDER — FAMOTIDINE 20 MG PO TABS: 20.0000 mg | ORAL_TABLET | Freq: Once | ORAL | Status: AC

## 2022-11-28 MED ORDER — KETOROLAC TROMETHAMINE 15 MG/ML IJ SOLN
INTRAMUSCULAR | Status: AC
  Administered 2022-11-28: 15 mg via INTRAVENOUS
  Filled 2022-11-28: qty 1

## 2022-11-28 MED ORDER — KETAMINE HCL 50 MG/5ML IJ SOSY
PREFILLED_SYRINGE | INTRAMUSCULAR | Status: AC
  Filled 2022-11-28: qty 5

## 2022-11-28 MED ORDER — KETAMINE HCL 10 MG/ML IJ SOLN
INTRAMUSCULAR | Status: DC | PRN
  Administered 2022-11-28: 20 mg via INTRAVENOUS
  Administered 2022-11-28: 30 mg via INTRAVENOUS

## 2022-11-28 MED ORDER — SODIUM CHLORIDE 0.9 % BOLUS PEDS
250.0000 mL | Freq: Once | INTRAVENOUS | Status: AC
  Administered 2022-11-28: 250 mL via INTRAVENOUS

## 2022-11-28 MED ORDER — SODIUM CHLORIDE 0.9 % IV SOLN: INTRAVENOUS | Status: DC

## 2022-11-28 MED ORDER — ACETAMINOPHEN 10 MG/ML IV SOLN
INTRAVENOUS | Status: AC
  Filled 2022-11-28: qty 100

## 2022-11-28 MED ORDER — FENTANYL CITRATE (PF) 100 MCG/2ML IJ SOLN
25.0000 ug | INTRAMUSCULAR | Status: DC | PRN
  Administered 2022-11-28 (×4): 25 ug via INTRAVENOUS

## 2022-11-28 MED ORDER — TRIAMCINOLONE ACETONIDE 40 MG/ML IJ SUSP
INTRAMUSCULAR | Status: DC | PRN
  Administered 2022-11-28: 93 mL

## 2022-11-28 MED ORDER — CHLORHEXIDINE GLUCONATE 0.12 % MT SOLN: 15.0000 mL | Freq: Once | OROMUCOSAL | Status: AC

## 2022-11-28 MED ORDER — TRIAMCINOLONE ACETONIDE 40 MG/ML IJ SUSP
INTRAMUSCULAR | Status: AC
  Filled 2022-11-28: qty 2

## 2022-11-28 MED ORDER — SODIUM CHLORIDE 0.9 % IR SOLN
Status: DC | PRN
  Administered 2022-11-28: 3000 mL

## 2022-11-28 MED ORDER — SUGAMMADEX SODIUM 500 MG/5ML IV SOLN
INTRAVENOUS | Status: AC
  Filled 2022-11-28: qty 5

## 2022-11-28 MED ORDER — CHLORHEXIDINE GLUCONATE 0.12 % MT SOLN
OROMUCOSAL | Status: AC
  Administered 2022-11-28: 15 mL via OROMUCOSAL
  Filled 2022-11-28: qty 15

## 2022-11-28 MED ORDER — FENTANYL CITRATE (PF) 100 MCG/2ML IJ SOLN
INTRAMUSCULAR | Status: DC | PRN
  Administered 2022-11-28 (×2): 50 ug via INTRAVENOUS
  Administered 2022-11-28: 150 ug via INTRAVENOUS
  Administered 2022-11-28: 100 ug via INTRAVENOUS

## 2022-11-28 MED ORDER — BUPIVACAINE LIPOSOME 1.3 % IJ SUSP
INTRAMUSCULAR | Status: AC
  Filled 2022-11-28: qty 20

## 2022-11-28 MED ORDER — DEXMEDETOMIDINE HCL IN NACL 80 MCG/20ML IV SOLN
INTRAVENOUS | Status: DC | PRN
  Administered 2022-11-28: 20 ug via BUCCAL
  Administered 2022-11-28 (×5): 8 ug via BUCCAL

## 2022-11-28 MED ORDER — ONDANSETRON HCL 4 MG/2ML IJ SOLN: 4.0000 mg | Freq: Four times a day (QID) | INTRAMUSCULAR | Status: DC | PRN

## 2022-11-28 MED ORDER — ONDANSETRON HCL 4 MG PO TABS: 4.0000 mg | ORAL_TABLET | Freq: Four times a day (QID) | ORAL | Status: DC | PRN

## 2022-11-28 MED ORDER — PROPOFOL 1000 MG/100ML IV EMUL
INTRAVENOUS | Status: AC
  Filled 2022-11-28: qty 100

## 2022-11-28 MED ORDER — DEXAMETHASONE SODIUM PHOSPHATE 10 MG/ML IJ SOLN
INTRAMUSCULAR | Status: AC
  Filled 2022-11-28: qty 1

## 2022-11-28 MED ORDER — MIDAZOLAM HCL 2 MG/2ML IJ SOLN
INTRAMUSCULAR | Status: AC
  Filled 2022-11-28: qty 2

## 2022-11-28 MED ORDER — METOCLOPRAMIDE HCL 10 MG PO TABS: 5.0000 mg | ORAL_TABLET | Freq: Three times a day (TID) | ORAL | Status: DC | PRN

## 2022-11-28 MED ORDER — ONDANSETRON HCL 4 MG/2ML IJ SOLN
INTRAMUSCULAR | Status: AC
  Filled 2022-11-28: qty 2

## 2022-11-28 MED ORDER — FAMOTIDINE 20 MG PO TABS
ORAL_TABLET | ORAL | Status: AC
  Administered 2022-11-28: 20 mg via ORAL
  Filled 2022-11-28: qty 1

## 2022-11-28 MED ORDER — SODIUM CHLORIDE (PF) 0.9 % IJ SOLN: INTRAMUSCULAR | Status: DC | PRN

## 2022-11-28 MED ORDER — LIDOCAINE HCL (CARDIAC) PF 100 MG/5ML IV SOSY
PREFILLED_SYRINGE | INTRAVENOUS | Status: DC | PRN
  Administered 2022-11-28: 100 mg via INTRAVENOUS

## 2022-11-28 MED ORDER — FENTANYL CITRATE (PF) 100 MCG/2ML IJ SOLN
INTRAMUSCULAR | Status: AC
  Filled 2022-11-28: qty 2

## 2022-11-28 MED ORDER — TRANEXAMIC ACID 1000 MG/10ML IV SOLN
INTRAVENOUS | Status: DC | PRN
  Administered 2022-11-28: 1000 mg via TOPICAL

## 2022-11-28 MED ORDER — DEXMEDETOMIDINE HCL IN NACL 200 MCG/50ML IV SOLN: INTRAVENOUS | Status: DC | PRN

## 2022-11-28 MED ORDER — MIDAZOLAM HCL 5 MG/5ML IJ SOLN
INTRAMUSCULAR | Status: DC | PRN
  Administered 2022-11-28: 2 mg via INTRAVENOUS

## 2022-11-28 MED ORDER — PROPOFOL 10 MG/ML IV BOLUS
INTRAVENOUS | Status: DC | PRN
  Administered 2022-11-28: 100 mg via INTRAVENOUS
  Administered 2022-11-28: 50 mg via INTRAVENOUS
  Administered 2022-11-28: 250 mg via INTRAVENOUS

## 2022-11-28 MED ORDER — TRANEXAMIC ACID-NACL 1000-0.7 MG/100ML-% IV SOLN
INTRAVENOUS | Status: AC
  Filled 2022-11-28: qty 100

## 2022-11-28 MED ORDER — ORAL CARE MOUTH RINSE: 15.0000 mL | Freq: Once | OROMUCOSAL | Status: AC

## 2022-11-28 MED ORDER — CEFAZOLIN IN SODIUM CHLORIDE 3-0.9 GM/100ML-% IV SOLN
3.0000 g | INTRAVENOUS | Status: AC
  Administered 2022-11-28: 3 g via INTRAVENOUS
  Filled 2022-11-28: qty 100

## 2022-11-28 MED ORDER — DEXAMETHASONE SODIUM PHOSPHATE 10 MG/ML IJ SOLN
INTRAMUSCULAR | Status: DC | PRN
  Administered 2022-11-28: 10 mg via INTRAVENOUS

## 2022-11-28 MED ORDER — TRANEXAMIC ACID 1000 MG/10ML IV SOLN
INTRAVENOUS | Status: AC
  Filled 2022-11-28: qty 10

## 2022-11-28 MED ORDER — SUGAMMADEX SODIUM 500 MG/5ML IV SOLN
INTRAVENOUS | Status: DC | PRN
  Administered 2022-11-28: 300 mg via INTRAVENOUS

## 2022-11-28 MED ORDER — ACETAMINOPHEN 10 MG/ML IV SOLN
INTRAVENOUS | Status: DC | PRN
  Administered 2022-11-28: 1000 mg via INTRAVENOUS

## 2022-11-28 SURGICAL SUPPLY — 61 items
BIT DRILL QUICK REL 1/8 2PK SL (DRILL) IMPLANT
BLADE SAW SAG 25X90X1.19 (BLADE) ×1 IMPLANT
BLADE SURG SZ20 CARB STEEL (BLADE) ×1 IMPLANT
BNDG ELASTIC 6X5.8 VLCR NS LF (GAUZE/BANDAGES/DRESSINGS) ×1 IMPLANT
CEMENT BONE R 1X40 (Cement) ×2 IMPLANT
CEMENT VACUUM MIXING SYSTEM (MISCELLANEOUS) ×1 IMPLANT
CHLORAPREP W/TINT 26 (MISCELLANEOUS) ×1 IMPLANT
COMP FEMORAL CRUC RIGHT 75MM (Joint) ×1 IMPLANT
COMPONENT FEMRL CRUC RT 75MM (Joint) IMPLANT
COOLER POLAR GLACIER W/PUMP (MISCELLANEOUS) ×1 IMPLANT
COVER MAYO STAND REUSABLE (DRAPES) ×1 IMPLANT
CUFF TOURN SGL QUICK 24 (TOURNIQUET CUFF)
CUFF TOURN SGL QUICK 34 (TOURNIQUET CUFF)
CUFF TRNQT CYL 24X4X16.5-23 (TOURNIQUET CUFF) IMPLANT
CUFF TRNQT CYL 34X4.125X (TOURNIQUET CUFF) IMPLANT
DRAPE 3/4 80X56 (DRAPES) ×1 IMPLANT
DRAPE IMP U-DRAPE 54X76 (DRAPES) ×1 IMPLANT
DRAPE U-SHAPE 47X51 STRL (DRAPES) ×1 IMPLANT
DRILL QUICK RELEASE 1/8 INCH (DRILL) ×3
DRSG MEPILEX SACRM 8.7X9.8 (GAUZE/BANDAGES/DRESSINGS) IMPLANT
DRSG OPSITE POSTOP 4X10 (GAUZE/BANDAGES/DRESSINGS) ×1 IMPLANT
DRSG OPSITE POSTOP 4X8 (GAUZE/BANDAGES/DRESSINGS) ×1 IMPLANT
ELECT REM PT RETURN 9FT ADLT (ELECTROSURGICAL) ×1
ELECTRODE REM PT RTRN 9FT ADLT (ELECTROSURGICAL) ×1 IMPLANT
GAUZE XEROFORM 1X8 LF (GAUZE/BANDAGES/DRESSINGS) ×1 IMPLANT
GLOVE BIO SURGEON STRL SZ7.5 (GLOVE) ×4 IMPLANT
GLOVE BIO SURGEON STRL SZ8 (GLOVE) ×4 IMPLANT
GLOVE BIOGEL PI IND STRL 8 (GLOVE) ×1 IMPLANT
GLOVE SURG UNDER LTX SZ8 (GLOVE) ×1 IMPLANT
GOWN STRL REUS W/ TWL LRG LVL3 (GOWN DISPOSABLE) ×1 IMPLANT
GOWN STRL REUS W/ TWL XL LVL3 (GOWN DISPOSABLE) ×1 IMPLANT
GOWN STRL REUS W/TWL LRG LVL3 (GOWN DISPOSABLE) ×1
GOWN STRL REUS W/TWL XL LVL3 (GOWN DISPOSABLE) ×1
HOOD PEEL AWAY T7 (MISCELLANEOUS) ×3 IMPLANT
IMPL PATELLA POST 37X8.5 (Miscellaneous) IMPLANT
IMPLANT PATELLA POST 37X8.5 (Miscellaneous) ×1 IMPLANT
INSERT TIB BEARING 83X12 (Insert) IMPLANT
IV NS IRRIG 3000ML ARTHROMATIC (IV SOLUTION) ×1 IMPLANT
KIT TURNOVER KIT A (KITS) ×1 IMPLANT
MANIFOLD NEPTUNE II (INSTRUMENTS) ×1 IMPLANT
NDL SPNL 20GX3.5 QUINCKE YW (NEEDLE) ×1 IMPLANT
NEEDLE SPNL 20GX3.5 QUINCKE YW (NEEDLE) ×1 IMPLANT
NS IRRIG 1000ML POUR BTL (IV SOLUTION) ×1 IMPLANT
PACK TOTAL KNEE (MISCELLANEOUS) ×1 IMPLANT
PAD WRAPON POLAR KNEE (MISCELLANEOUS) ×1 IMPLANT
PENCIL SMOKE EVACUATOR (MISCELLANEOUS) ×1 IMPLANT
PLATE TIBIAL INTERLOK 83 (Plate) IMPLANT
PULSAVAC PLUS IRRIG FAN TIP (DISPOSABLE) ×1
STAPLER SKIN PROX 35W (STAPLE) ×1 IMPLANT
SUCTION FRAZIER HANDLE 10FR (MISCELLANEOUS) ×1
SUCTION TUBE FRAZIER 10FR DISP (MISCELLANEOUS) ×1 IMPLANT
SUT VIC AB 0 CT1 36 (SUTURE) ×3 IMPLANT
SUT VIC AB 2-0 CT1 27 (SUTURE) ×3
SUT VIC AB 2-0 CT1 TAPERPNT 27 (SUTURE) ×3 IMPLANT
SYR 10ML LL (SYRINGE) ×1 IMPLANT
SYR 20ML LL LF (SYRINGE) ×1 IMPLANT
SYR 30ML LL (SYRINGE) IMPLANT
TIP FAN IRRIG PULSAVAC PLUS (DISPOSABLE) ×1 IMPLANT
TRAP FLUID SMOKE EVACUATOR (MISCELLANEOUS) ×2 IMPLANT
WATER STERILE IRR 500ML POUR (IV SOLUTION) ×1 IMPLANT
WRAPON POLAR PAD KNEE (MISCELLANEOUS) ×1

## 2022-11-28 NOTE — H&P (Signed)
History of Present Illness: Benjamin Medina is a 71 y.o. male who presents today for his surgical history and physical for upcoming right total knee arthroplasty. Patient is scheduled for a right knee total knee arthroplasty with Dr. Roland Rack on 11/28/2022. The patient denies any changes in his medical history since he was last evaluated. He is a diabetic and does take Ozempic, he has been instructed to stop this medication and has already done so. His last take this medication was 11/23/2022.Marland Kitchen He denies any personal history of heart attack or stroke. He denies any history of asthma or COPD. No personal history of blood clots. The patient does have a history of lymphedema. The patient continues reporting moderate aching and throbbing pain in the right knee. Pain score today is a 6 out of 10. He denies any falls or injury affecting the right knee since his last evaluation.  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 Roland Rack)  Arthroscopic partial medial meniscectomy with abrasion chondroplasty of grade 3 chondromalacial changes of the medial femoral condyle, left knee. Left 01/14/2019 (Dr. Roland Rack)  BLEPHAROPLASTY  COLONOSCOPY  FUNCTIONAL ENDOSCOPIC SINUS SURGERY  VASECTOMY   Past Family History: No Known Problems Mother  No Known Problems Father   Medications: atorvastatin (LIPITOR) 10 MG tablet Take 10 mg by mouth once daily 1  clobetasol (CORMAX) 0.05 % external solution 1 APPLICATION SOLUTION, EXTERNAL 2 TIMES A DAY 3  hydroCHLOROthiazide (MICROZIDE) 12.5 mg capsule Take 12.5 mg by mouth once daily   ibuprofen (ADVIL,MOTRIN) 200 MG tablet Take 200 mg by mouth every 6 (six) hours as needed for Pain.  lisinopriL (ZESTRIL) 40 MG tablet Take 40 mg by mouth once daily  meloxicam (MOBIC) 7.5 MG tablet TAKE 1 TABLET BY MOUTH EVERY DAY 30 tablet 3  OZEMPIC 0.25 mg or 0.5 mg (2 mg/3 mL) pen injector  semaglutide (OZEMPIC) 0.25 mg or 0.5 mg(2 mg/1.5 mL) pen injector Inject 0.25 mg subcutaneously once a week  [START ON 12/12/2022] semaglutide (OZEMPIC) 2 mg/dose (8 mg/3 mL) pen injector Inject subcutaneously  triamcinolone 0.025 % cream Apply topically 2 (two) times daily  BINAXNOW COVID-19 AG SELF TEST Kit as directed (Patient not taking: Reported on 11/25/2022)  clobetasol (TEMOVATE) 0.05 % topical gel APPLY TO THE AFFECTED AREA(S) 2 TIMES A DAY (Patient not taking: Reported on 11/25/2022) 3  LAGEVRIO, EUA, 200 mg capsule (Patient not taking: Reported on 11/25/2022)  lisinopril (ZESTRIL) 10 MG tablet Take by mouth (Patient not taking: Reported on 11/25/2022)  lisinopriL (ZESTRIL) 20 MG tablet Take 20 mg by mouth once daily (Patient not taking: Reported on 11/25/2022)  promethazine-dextromethorphan (PROMETHAZINE-DM) 6.25-15 mg/5 mL syrup TAKE 5 ML BY MOUTH FOUR TIMES DAILY AS NEEDED FOR COUGH (Patient not taking: Reported on 11/25/2022)  sulfamethoxazole-trimethoprim (BACTRIM DS) 800-160 mg tablet Take 1 tablet by mouth 2 (two) times daily (Patient not taking: Reported on 11/25/2022)   Allergies: No Known Allergies   Review of Systems:  A comprehensive 14 point ROS was performed, reviewed by me today, and the pertinent orthopaedic findings are documented in the HPI.  Physical Exam: BP 134/82  Ht 193 cm (_0 )  BMI 37.49 kg/m  General/Constitutional: The patient appears to be well-nourished, well-developed,  and in no acute distress. Neuro/Psych: Normal mood and affect, oriented to person, place and time. Eyes: Non-icteric. Pupils are equal, round, and reactive to light, and exhibit  synchronous movement. ENT: Unremarkable. Lymphatic: No palpable adenopathy. Respiratory: Lungs clear to auscultation, Normal chest excursion, No wheezes, and Non-labored breathing Cardiovascular: Regular rate and rhythm. No murmurs. and No edema, swelling or tenderness, except as noted in detailed exam. Integumentary: No impressive skin lesions present, except as noted in detailed exam. Musculoskeletal: Unremarkable, except as noted in detailed exam.  Right knee exam: GAIT: Minimal limp, favoring his right leg, but is not using any assistive devices. ALIGNMENT: Mild varus SKIN: Well-healed arthroscopic portal sites, otherwise unremarkable SWELLING: Minimal EFFUSION: At most a trace WARMTH: None TENDERNESS: Mild tenderness along the medial joint line ROM: 0-120 degrees without pain McMURRAY'S: Negative PATELLOFEMORAL: Normal tracking with no peri-patellar tenderness and negative apprehension sign CREPITUS: Trace patellofemoral crepitance LACHMAN'S: Negative PIVOT SHIFT: Negative ANTERIOR DRAWER: Negative POSTERIOR DRAWER: Negative VARUS/VALGUS: Minimal pseudolaxity to varus stressing.  He remains neurovascularly intact to the right lower extremity and foot.  X-rays/MRI/Lab data:  Standing AP and lateral x-rays of the right knee, as well as a sunrise view, are obtained. These films demonstrate moderate severe degenerative joint disease, primarily involving the medial compartment, with approximate 90% loss of the medial compartment clear space. Lesser degenerative changes of the lateral compartment also are noted. The patellofemoral compartment space appears to be well-maintained. There is mild varus alignment to the knee. No fractures or lytic lesions are identified.  Impression: 1. Primary osteoarthritis of right knee. 2. Severe obesity (BMI 35.0-39.9) with comorbidity (CMS-HCC).  Plan:  1. Treatment options were discussed today with the patient. 2. The patient is scheduled for a  right total knee arthroplasty with Dr. Roland Rack on 11/28/2022. 3. The patient was instructed on the risk and benefits of surgery and wishes to proceed at this time. This document will serve as a surgical history and physical for the patient. 4. The patient will follow-up per standard postop protocol. They can call the clinic they have any questions, new symptoms develop or symptoms worsen.  The procedure was discussed with the patient, as were the potential risks (including bleeding, infection, nerve and/or blood vessel injury, persistent or recurrent pain, failure of the hardware, dislocation, heterotopic ossification, need for further surgery, blood clots, strokes, heart attacks and/or arhythmias, pneumonia, etc.) and benefits. The patient states his understanding and wishes to proceed.    H&P reviewed and patient re-examined. No changes.

## 2022-11-28 NOTE — Op Note (Signed)
11/28/2022  10:40 AM  Patient:   Benjamin Medina  Pre-Op Diagnosis:   Degenerative joint disease, right knee.  Post-Op Diagnosis:   Same  Procedure:   Right TKA using all-cemented [employer] Vanguard system with a 75 mm PCR femur, an  83  mm tibial tray with a 12 mm anterior stabilized E-poly insert, and a 37 x 8.6 mm all-poly 3-pegged domed patella.  Surgeon:   Maryagnes Amos, MD  Assistant:   Horris Latino, PA-C   Anesthesia:   Attempted spinal --> GET  Findings:   As above  Complications:   None  EBL:   10 cc  Fluids:   1300 cc crystalloid  UOP:   None  TT:   95 minutes at 300 mmHg  Drains:   None  Closure:   Staples  Implants:   As above  Brief Clinical Note:   The patient is a 71 year old male with a long history of progressively worsening right knee pain. The patient's symptoms have progressed despite medications, activity modification, injections, etc. The patient's history and examination were consistent with advanced degenerative joint disease of the right knee confirmed by plain radiographs. The patient presents at this time for a right total knee arthroplasty.  Procedure:   The patient was brought into the operating room. After an attempt at spinal anesthesia was unsuccessful, the patient was lain in the supine position before undergoing general endotracheal intubation and anesthesia.  The right lower extremity was prepped with ChloraPrep solution and draped sterilely. Preoperative antibiotics were administered. A timeout was performed to verify the appropriate surgical site before the limb was exsanguinated with an Esmarch and the tourniquet inflated to 300 mmHg.   A standard anterior approach to the knee was made through an approximately 7 inch incision. The incision was carried down through the subcutaneous tissues to expose superficial retinaculum. This was split the length of the incision and the medial flap elevated sufficiently to expose the medial retinaculum.  The medial retinaculum was incised, leaving a 3-4 mm cuff of tissue on the patella. This was extended distally along the medial border of the patellar tendon and proximally through the medial third of the quadriceps tendon. A subtotal fat pad excision was performed before the soft tissues were elevated off the anteromedial and anterolateral aspects of the proximal tibia to the level of the collateral ligaments. The anterior portions of the medial and lateral menisci were removed, as was the anterior cruciate ligament. With the knee flexed to 90, the external tibial guide was positioned and the appropriate proximal tibial cut made. This piece was taken to the back table where it was measured and found to be optimally replicated by an 83 mm component.  Attention was directed to the distal femur. The intramedullary canal was accessed through a 3/8" drill hole. The intramedullary guide was inserted and positioned in order to obtain a neutral flexion gap. The intercondylar block was positioned with care taken to avoid notching the anterior cortex of the femur. The appropriate cut was made. Next, the distal cutting block was placed at 5 of valgus alignment. Using the 9 mm slot, the distal cut was made. The distal femur was measured and found to be optimally replicated by the 75 mm component. The 75 mm 4-in-1 cutting block was positioned and first the posterior, then the posterior chamfer, the anterior chamfer, and finally the anterior cuts were made. At this point, the posterior portions medial and lateral menisci were removed. A trial reduction was  performed using the appropriate femoral and tibial components with first the 10 mm and then the 12 mm insert. The 12 mm insert demonstrated excellent stability to varus and valgus stressing both in flexion and extension while permitting full extension. Patella tracking was assessed and found to be excellent. Therefore, the tibial guide position was marked on the proximal  tibia. The patella thickness was measured and found to be 25 mm. Therefore, the appropriate cut was made. The patellar surface was measured and found to be optimally replicated by the 37 mm component. The three peg holes were drilled in place before the trial button was inserted. Patella tracking was assessed and found to be excellent, passing the "no thumb test". The lug holes were drilled into the distal femur before the trial component was removed, leaving only the tibial tray. The keel was then created using the appropriate tower, reamer, and punch.  The bony surfaces were prepared for cementing by irrigating them thoroughly with sterile saline solution via the jet lavage system. A bone plug was fashioned from some of the bone that had been removed previously and used to plug the distal femoral canal. In addition, 20 cc of Exparel diluted out to 60 cc with normal saline and 30 cc of 0.5% Sensorcaine were injected into the postero-medial and postero-lateral aspects of the knee, the medial and lateral gutter regions, and the peri-incisional tissues to help with postoperative analgesia. Meanwhile, the cement was being mixed on the back table. When it was ready, the tibial tray was cemented in first. The excess cement was removed using Personal assistant. Next, the femoral component was impacted into place. Again, the excess cement was removed using Personal assistant. The 12 mm trial insert was positioned and the knee brought into extension while the cement hardened. Finally, the patella was cemented into place and secured using the patellar clamp. Again, the excess cement was removed using Personal assistant. Once the cement had hardened, the knee was placed through a range of motion with the findings as described above. Therefore, the trial insert was removed and, after verifying that no cement had been retained posteriorly, the permanent 12 mm anterior stabilized E-polyethylene insert was positioned and secured using  the appropriate key locking mechanism. Again the knee was placed through a range of motion with the findings as described above.  The wound was copiously irrigated with sterile saline solution using the jet lavage system before the quadriceps tendon and retinacular layer were reapproximated using #0 Vicryl interrupted sutures. The superficial retinacular layer also was closed using a running #0 Vicryl suture. A total of 10 cc of transexemic acid (TXA) was injected intra-articularly before the subcutaneous tissues were closed in several layers using 2-0 Vicryl interrupted sutures. The skin was closed using staples. A sterile honeycomb dressing was applied to the skin before the leg was wrapped with an Ace wrap to accommodate the Polar Care device. The patient was then awakened, extubated, and returned to the recovery room in satisfactory condition after tolerating the procedure well.

## 2022-11-28 NOTE — Progress Notes (Signed)
Physical Therapy Treatment Patient Details Name: Benjamin Medina MRN: [id number] DOB: [birth year] Today's Date: 11/28/2022   History of Present Illness Patient is a 71 year old male with degenerative joint disease, right knee. S/p right TKA.    PT Comments    Pt tolerated 2nd PT session well. Answer all pt/pt's spouse's questions and both endorse feeling confident with Dcing home. Rn in at conclusion of session to discuss DC summery. Pt was able to exit bed, stand, and ambulate without safety concern. Safely performed ascending/descending stairs. Author observed pt+ spouse safely ambulate to BR and successfully perform ADL task without assist from staff. Cleared from an acute PT standpoint to DC home with HHPT to start Saturday.   Recommendations for follow up therapy are one component of a multi-disciplinary discharge planning process, led by the attending physician.  Recommendations may be updated based on patient status, additional functional criteria and insurance authorization.  Follow Up Recommendations  Home health PT     Assistance Recommended at Discharge Set up Supervision/Assistance  Patient can return home with the following Help with stairs or ramp for entrance;Assist for transportation   Equipment Recommendations  None recommended by PT (pt has personal equipment already)       Precautions / Restrictions Precautions Precautions: Fall;Knee Precaution Booklet Issued: Yes (comment) Restrictions Weight Bearing Restrictions: Yes RLE Weight Bearing: Weight bearing as tolerated     Mobility  Bed Mobility Overal bed mobility: Needs Assistance Bed Mobility: Supine to Sit, Sit to Supine  Supine to sit: Supervision Sit to supine: Supervision     Transfers Overall transfer level: Needs assistance Equipment used: Rolling walker (2 wheels) Transfers: Sit to/from Stand Sit to Stand: Min guard     Ambulation/Gait Ambulation/Gait assistance: Supervision, Min guard Gait  Distance (Feet): 50 Feet Assistive device: Rolling walker (2 wheels) Gait Pattern/deviations: Step-to pattern, Step-through pattern, Antalgic Gait velocity: decreased     General Gait Details: No LOB or safety concern with ambulation to stairs and return. Both pt and spouse voice confidence in abilities   Stairs Stairs: Yes Stairs assistance: Min guard Stair Management: No rails, Backwards, Step to pattern Number of Stairs: 4 General stair comments: pt and spouse was able to ambulate up/down stairs with CGA only.    Balance Overall balance assessment: Needs assistance Sitting-balance support: Feet supported Sitting balance-Leahy Scale: Good     Standing balance support: Bilateral upper extremity supported Standing balance-Leahy Scale: Good Standing balance comment: reliant on RW       Cognition Arousal/Alertness: Awake/alert Behavior During Therapy: WFL for tasks assessed/performed Overall Cognitive Status: Within Functional Limits for tasks assessed      General Comments: Pt is A and O x 4           General Comments General comments (skin integrity, edema, etc.): reviewed all exercises, precautions, polar care, and expectations at DC      Pertinent Vitals/Pain Pain Assessment Pain Assessment: 0-10 Pain Score: 6  Pain Location: R knee Pain Descriptors / Indicators: Discomfort Pain Intervention(s): Limited activity within patient's tolerance, Monitored during session, Premedicated before session, Repositioned    Home Living Family/patient expects to be discharged to:: Private residence Living Arrangements: Spouse/significant other Available Help at Discharge: Family Type of Home: House Home Access: Stairs to enter Entrance Stairs-Rails: None Entrance Stairs-Number of Steps: 3-4   Home Layout: One level Home Equipment: None          PT Goals (current goals can now be found in the care plan section)  Acute Rehab PT Goals Patient Stated Goal: to go  home PT Goal Formulation: With patient Time For Goal Achievement: 12/12/22 Potential to Achieve Goals: Good Progress towards PT goals: Progressing toward goals    Frequency    BID      PT Plan Current plan remains appropriate       AM-PAC PT "6 Clicks" Mobility   Outcome Measure  Help needed turning from your back to your side while in a flat bed without using bedrails?: None Help needed moving from lying on your back to sitting on the side of a flat bed without using bedrails?: A Little Help needed moving to and from a bed to a chair (including a wheelchair)?: A Little Help needed standing up from a chair using your arms (e.g., wheelchair or bedside chair)?: A Little Help needed to walk in hospital room?: A Little Help needed climbing 3-5 steps with a railing? : A Little 6 Click Score: 19    End of Session Equipment Utilized During Treatment: Gait belt (issued gait bed) Activity Tolerance: Patient tolerated treatment well Patient left: with call bell/phone within reach;in bed;with family/visitor present Nurse Communication: Mobility status PT Visit Diagnosis: Muscle weakness (generalized) (M62.81);Difficulty in walking, not elsewhere classified (R26.2)     Time: 4503-8882 PT Time Calculation (min) (ACUTE ONLY): 32 min  Charges:  $Gait Training: 8-22 mins $Therapeutic Exercise: 8-22 mins $Therapeutic Activity: 8-22 mins                    Jetta Lout PTA 11/28/22, 4:48 PM

## 2022-11-28 NOTE — Evaluation (Addendum)
Physical Therapy Evaluation Patient Details Name: Benjamin Medina MRN: [id number] DOB: [birth year] Today's Date: 11/28/2022  History of Present Illness  Patient is a 71 year old male with degenerative joint disease, right knee. S/p right TKA.  Clinical Impression  Patient is agreeable to PT evaluation. Supportive spouse at the bedside. Patient is independent at baseline with mobility but with right knee pain with fatigue with activity.  The patient reports feeling mildly dizzy with sitting upright on the side of the bed. Therapeutic exercises initiated with handout provided. Education on positioning of RLE to promote knee extension. Patient reports 5/10 pain at rest increasing to 7/10 pain with increased activity. Gait training initiated and patient walked 50ft with rolling walker with slow but steady cadence. Offered stair training, however patient declined due to fatigue. Spouse requested for PT to return later for stair training and possible d/c home today, will try to accommodate this. Recommend to continue PT to maximize independence and decrease caregiver burden. Patient could benefit from staying another night for continued mobility training tomorrow.      Recommendations for follow up therapy are one component of a multi-disciplinary discharge planning process, led by the attending physician.  Recommendations may be updated based on patient status, additional functional criteria and insurance authorization.  Follow Up Recommendations Home health PT      Assistance Recommended at Discharge Set up Supervision/Assistance  Patient can return home with the following  Help with stairs or ramp for entrance;Assist for transportation    Equipment Recommendations Rolling walker (2 wheels);BSC/3in1  Recommendations for Other Services       Functional Status Assessment Patient has had a recent decline in their functional status and demonstrates the ability to make significant improvements in  function in a reasonable and predictable amount of time.     Precautions / Restrictions Precautions Precautions: Fall;Knee Precaution Booklet Issued: Yes (comment) Restrictions Weight Bearing Restrictions: Yes RLE Weight Bearing: Weight bearing as tolerated      Mobility  Bed Mobility Overal bed mobility: Needs Assistance Bed Mobility: Supine to Sit, Sit to Supine     Supine to sit: Supervision Sit to supine: Min assist   General bed mobility comments: assistance for RLE support    Transfers Overall transfer level: Needs assistance Equipment used: Rolling walker (2 wheels) Transfers: Sit to/from Stand Sit to Stand: Min guard           General transfer comment: verbal cues for hand placement for safety    Ambulation/Gait Ambulation/Gait assistance: Min guard Gait Distance (Feet): 90 Feet Assistive device: Rolling walker (2 wheels) Gait Pattern/deviations: Step-to pattern, Decreased stride length, Decreased stance time - right Gait velocity: decreased     General Gait Details: verbal cues for rolling walker and BLE sequencing. slow but steady cadence  Stairs Stairs:  (patient declined stair training and spouse is requesting for PT to return later today to try. verbally educated patient on sequencing to go up/down the steps)          Wheelchair Mobility    Modified Rankin (Stroke Patients Only)       Balance Overall balance assessment: Needs assistance Sitting-balance support: Feet supported Sitting balance-Leahy Scale: Good     Standing balance support: Bilateral upper extremity supported Standing balance-Leahy Scale: Fair                               Pertinent Vitals/Pain Pain Assessment Pain Assessment: Faces Pain Score:  7  Pain Location: R knee Pain Descriptors / Indicators: Discomfort Pain Intervention(s): Limited activity within patient's tolerance, Monitored during session, Repositioned (polar care re-applied at end of  session)    Home Living Family/patient expects to be discharged to:: Private residence Living Arrangements: Spouse/significant other Available Help at Discharge: Family Type of Home: House Home Access: Stairs to enter Entrance Stairs-Rails: None Secretary/administrator of Steps: 3-4   Home Layout: One level Home Equipment: None      Prior Function Prior Level of Function : Independent/Modified Independent                     Hand Dominance        Extremity/Trunk Assessment   Upper Extremity Assessment Upper Extremity Assessment: Overall WFL for tasks assessed    Lower Extremity Assessment Lower Extremity Assessment: RLE deficits/detail RLE Deficits / Details: patient able to complete SLR without assistance. he can activtae hip and knee ROM with pain at the knee       Communication   Communication: No difficulties  Cognition Arousal/Alertness: Awake/alert Behavior During Therapy: WFL for tasks assessed/performed Overall Cognitive Status: Within Functional Limits for tasks assessed                                          General Comments      Exercises Total Joint Exercises Ankle Circles/Pumps: AROM, Strengthening, Right, 10 reps, Supine Quad Sets: AROM, Strengthening, Right, 5 reps, Supine Straight Leg Raises: AROM, Strengthening, Right, 5 reps, Supine Goniometric ROM: R knee 9-82 degrees   Assessment/Plan    PT Assessment Patient needs continued PT services  PT Problem List Decreased strength;Decreased range of motion;Decreased activity tolerance;Decreased balance;Decreased mobility;Decreased safety awareness       PT Treatment Interventions DME instruction;Gait training;Stair training;Functional mobility training;Therapeutic activities;Therapeutic exercise;Balance training;Neuromuscular re-education;Cognitive remediation;Patient/family education    PT Goals (Current goals can be found in the Care Plan section)  Acute Rehab PT  Goals Patient Stated Goal: to go home PT Goal Formulation: With patient Time For Goal Achievement: 12/12/22 Potential to Achieve Goals: Good    Frequency BID     Co-evaluation               AM-PAC PT "6 Clicks" Mobility  Outcome Measure Help needed turning from your back to your side while in a flat bed without using bedrails?: None Help needed moving from lying on your back to sitting on the side of a flat bed without using bedrails?: A Little Help needed moving to and from a bed to a chair (including a wheelchair)?: A Little Help needed standing up from a chair using your arms (e.g., wheelchair or bedside chair)?: A Little Help needed to walk in hospital room?: A Little Help needed climbing 3-5 steps with a railing? : A Little 6 Click Score: 19    End of Session Equipment Utilized During Treatment: Gait belt Activity Tolerance: Patient tolerated treatment well Patient left: in bed;with call bell/phone within reach;with family/visitor present (polar care re-applied) Nurse Communication: Mobility status PT Visit Diagnosis: Muscle weakness (generalized) (M62.81);Difficulty in walking, not elsewhere classified (R26.2)    Time: 9381-8299 PT Time Calculation (min) (ACUTE ONLY): 42 min   Charges:   PT Evaluation $PT Eval Low Complexity: 1 Low PT Treatments $Gait Training: 8-22 mins        Donna Bernard, PT, MPT   [place]  Roxan Hockey 11/28/2022, 3:05 PM

## 2022-11-28 NOTE — Anesthesia Procedure Notes (Signed)
Procedure Name: Intubation Date/Time: 11/28/2022 8:06 AM  Performed by: Katherine Basset, CRNAPre-anesthesia Checklist: Patient identified, Emergency Drugs available, Suction available and Patient being monitored Patient Re-evaluated:Patient Re-evaluated prior to induction Oxygen Delivery Method: Circle system utilized Preoxygenation: Pre-oxygenation with 100% oxygen Induction Type: IV induction Ventilation: Oral airway inserted - appropriate to patient size and Two handed mask ventilation required Laryngoscope Size: McGraph and 3 Grade View: Grade I Tube type: Oral Tube size: 8.0 mm Number of attempts: 1 Airway Equipment and Method: Stylet, Oral airway and Bite block Placement Confirmation: ETT inserted through vocal cords under direct vision, positive ETCO2 and breath sounds checked- equal and bilateral Secured at: 23 cm Tube secured with: Tape Dental Injury: Teeth and Oropharynx as per pre-operative assessment

## 2022-11-28 NOTE — Discharge Instructions (Addendum)
Orthopedic discharge instructions: May shower with intact OpSite dressing. Apply ice frequently to knee or use Polar Care. Start Eliquis 1 tablet (2.5 mg) twice daily on Friday, 11/29/2022, for 2 weeks, then take aspirin 325 mg twice daily for 4 weeks. Take pain medication as prescribed when needed.  May supplement with ES Tylenol if necessary. May weight-bear as tolerated on right leg - use walker for balance and support. Follow-up in 10-14 days or as scheduled.   AMBULATORY SURGERY  DISCHARGE INSTRUCTIONS   The drugs that you were given will stay in your system until tomorrow so for the next 24 hours you should not:  Drive an automobile Make any legal decisions Drink any alcoholic beverage   You may resume regular meals tomorrow.  Today it is better to start with liquids and gradually work up to solid foods.  You may eat anything you prefer, but it is better to start with liquids, then soup and crackers, and gradually work up to solid foods.   Please notify your doctor immediately if you have any unusual bleeding, trouble breathing, redness and pain at the surgery site, drainage, fever, or pain not relieved by medication.    Additional Instructions:        Please contact your physician with any problems or Same Day Surgery at [phone], Monday through Friday 6 am to 4 pm, or [place] at [facility] number at [phone].   POLAR CARE INFORMATION  MassAdvertisement.it  How to use Breg Polar Care [facility] Therapy System?  YouTube   ShippingScam.co.uk  OPERATING INSTRUCTIONS  Start the product With dry hands, connect the transformer to the electrical connection located on the top of the cooler. Next, plug the transformer into an appropriate electrical outlet. The unit will automatically start running at this point.  To stop the pump, disconnect electrical power.  Unplug to stop the product when not in use. Unplugging the Polar Care  unit turns it off. Always unplug immediately after use. Never leave it plugged in while unattended. Remove pad.    FIRST ADD WATER TO FILL LINE, THEN ICE---Replace ice when existing ice is almost melted  1 Discuss Treatment with your Licensed Health Care Practitioner and Use Only as Prescribed 2 Apply Insulation Barrier & Cold Therapy Pad 3 Check for Moisture 4 Inspect Skin Regularly  Tips and Trouble Shooting Usage Tips 1. Use cubed or chunked ice for optimal performance. 2. It is recommended to drain the Pad between uses. To drain the pad, hold the Pad upright with the hose pointed toward the ground. Depress the black plunger and allow water to drain out. 3. You may disconnect the Pad from the unit without removing the pad from the affected area by depressing the silver tabs on the hose coupling and gently pulling the hoses apart. The Pad and unit will seal itself and will not leak. Note: Some dripping during release is normal. 4. DO NOT RUN PUMP WITHOUT WATER! The pump in this unit is designed to run with water. Running the unit without water will cause permanent damage to the pump. 5. Unplug unit before removing lid.  TROUBLESHOOTING GUIDE Pump not running, Water not flowing to the pad, Pad is not getting cold 1. Make sure the transformer is plugged into the wall outlet. 2. Confirm that the ice and water are filled to the indicated levels. 3. Make sure there are no kinks in the pad. 4. Gently pull on the blue tube to make sure the tube/pad junction is  straight. 5. Remove the pad from the treatment site and ll it while the pad is lying at; then reapply. 6. Confirm that the pad couplings are securely attached to the unit. Listen for the double clicks (Figure 1) to confirm the pad couplings are securely attached.  Leaks    Note: Some condensation on the lines, controller, and pads is unavoidable, especially in warmer climates. 1. If using a Breg Polar Care Cold Therapy unit with a  detachable Cold Therapy Pad, and a leak exists (other than condensation on the lines) disconnect the pad couplings. Make sure the silver tabs on the couplings are depressed before reconnecting the pad to the pump hose; then confirm both sides of the coupling are properly clicked in. 2. If the coupling continues to leak or a leak is detected in the pad itself, stop using it and call [facility] at [phone].  Cleaning After use, empty and dry the unit with a soft cloth. Warm water and mild detergent may be used occasionally to clean the pump and tubes.  WARNING: The [place] can be cold enough to cause serious injury, including full skin necrosis. Follow these Operating Instructions, and carefully read the Product Insert (see pouch on side of unit) and the Cold Therapy Pad Fitting Instructions (provided with each Cold Therapy Pad) prior to use.    AMBULATORY SURGERY  DISCHARGE INSTRUCTIONS   The drugs that you were given will stay in your system until tomorrow so for the next 24 hours you should not:  Drive an automobile Make any legal decisions Drink any alcoholic beverage   You may resume regular meals tomorrow.  Today it is better to start with liquids and gradually work up to solid foods.  You may eat anything you prefer, but it is better to start with liquids, then soup and crackers, and gradually work up to solid foods.   Please notify your doctor immediately if you have any unusual bleeding, trouble breathing, redness and pain at the surgery site, drainage, fever, or pain not relieved by medication.    Additional Instructions:        Please contact your physician with any problems or Same Day Surgery at [phone], Monday through Friday 6 am to 4 pm, or  at [facility] number at [phone].

## 2022-11-28 NOTE — Anesthesia Postprocedure Evaluation (Signed)
Anesthesia Post Note  Patient: Benjamin Medina  Procedure(s) Performed: TOTAL KNEE ARTHROPLASTY (Right: Knee)  Patient location during evaluation: PACU Anesthesia Type: General Level of consciousness: awake and alert Pain management: pain level controlled Vital Signs Assessment: post-procedure vital signs reviewed and stable Respiratory status: spontaneous breathing, nonlabored ventilation, respiratory function stable and patient connected to nasal cannula oxygen Cardiovascular status: blood pressure returned to baseline and stable Postop Assessment: no apparent nausea or vomiting Anesthetic complications: no   No notable events documented.   Last Vitals:  Vitals:   11/28/22 1100 11/28/22 1115  BP: 124/71 111/72  Pulse: 77 78  Resp: 11 18  Temp:    SpO2: 93% 92%    Last Pain:  Vitals:   11/28/22 1100  TempSrc:   PainSc: Asleep                 Yevette Edwards

## 2022-11-28 NOTE — Transfer of Care (Signed)
Immediate Anesthesia Transfer of Care Note  Patient: Benjamin Medina  Procedure(s) Performed: TOTAL KNEE ARTHROPLASTY (Right: Knee)  Patient Location: PACU  Anesthesia Type:General  Level of Consciousness: drowsy  Airway & Oxygen Therapy: Patient Spontanous Breathing and Patient connected to face mask oxygen  Post-op Assessment: Report given to RN, Post -op Vital signs reviewed and stable, and Patient moving all extremities  Post vital signs: Reviewed and stable  Last Vitals:  Vitals Value Taken Time  BP 104/56 11/28/22 1016  Temp 36.6 C 11/28/22 1015  Pulse 74 11/28/22 1021  Resp 18 11/28/22 1021  SpO2 98 % 11/28/22 1021  Vitals shown include unvalidated device data.  Last Pain:  Vitals:   11/28/22 0627  TempSrc: Oral  PainSc: 0-No pain         Complications: No notable events documented.

## 2022-11-28 NOTE — Anesthesia Procedure Notes (Signed)
Spinal  Patient location during procedure: OR Start time: 11/28/2022 7:40 AM End time: 11/28/2022 8:00 AM Reason for block: surgical anesthesia Staffing Performed: anesthesiologist and resident/CRNA  Anesthesiologist: Yevette Edwards, MD Resident/CRNA: Katherine Basset, CRNA Performed by: Katherine Basset, CRNA Authorized by: Yevette Edwards, MD   Preanesthetic Checklist Completed: patient identified, IV checked, site marked, risks and benefits discussed, surgical consent, monitors and equipment checked, pre-op evaluation and timeout performed Spinal Block Patient position: sitting Prep: ChloraPrep Patient monitoring: heart rate, continuous pulse ox and blood pressure Approach: midline Location: L3-4 Injection technique: single-shot Needle Needle type: Quincke  Needle gauge: 22 G Needle length: 15 cm Assessment Events: second provider Additional Notes 1st 2 attempts by CRNA Erich Kochan unsuccessful, Dr. Pernell Dupre in room 2 attempts unsuccessful, decision made by Dr. Pernell Dupre to proceed w/GETA d/t difficulty in getting spinal

## 2022-11-28 NOTE — TOC Progression Note (Signed)
Transition of Care [facility]) - Progression Note    Patient Details  Name: Benjamin Medina MRN: [id number] Date of Birth: [birth year]  Transition of Care [facility]) CM/SW Contact  Marlowe Sax, RN Phone Number: 11/28/2022, 1:01 PM  Clinical Narrative:    [facility] was set up prior to surgery by surgeons office HE reported during Joint class that he will need a rolling walker and a 3 in 1, [employer] to deliver to the bedside His wife will assist him at home        Expected Discharge Plan and Services           Expected Discharge Date: 11/28/22                                     Social Determinants of Health (SDOH) Interventions    Readmission Risk Interventions     No data to display

## 2022-11-28 NOTE — Progress Notes (Cosign Needed)
Patient is not able to walk the distance required to go the bathroom, or he/she is unable to safely negotiate stairs required to access the bathroom.  A 3in1 BSC will alleviate this problem  

## 2022-11-28 NOTE — Anesthesia Preprocedure Evaluation (Signed)
Anesthesia Evaluation  Patient identified by MRN, date of birth, ID band Patient awake    Reviewed: Allergy & Precautions, H&P , NPO status , Patient's Chart, lab work & pertinent test results, reviewed documented beta blocker date and time   Airway Mallampati: III   Neck ROM: full    Dental  (+) Poor Dentition   Pulmonary neg pulmonary ROS, former smoker   Pulmonary exam normal        Cardiovascular Exercise Tolerance: Good hypertension, On Medications negative cardio ROS Normal cardiovascular exam Rhythm:regular Rate:Normal     Neuro/Psych negative neurological ROS  negative psych ROS   GI/Hepatic negative GI ROS,,,(+) Hepatitis -  Endo/Other  diabetes, Well Controlled  Morbid obesity  Renal/GU negative Renal ROS  negative genitourinary   Musculoskeletal   Abdominal   Peds  Hematology negative hematology ROS (+)   Anesthesia Other Findings Past Medical History: No date: Arthritis No date: 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 No date: Diabetes mellitus without complication (HCC) No date: Hyperlipidemia No date: Hypertension No date: Psoriasis Past Surgical History: 12/30/2014: BLEPHAROPLASTY; Bilateral 05/01/2016: COLONOSCOPY WITH PROPOFOL; N/A     Comment:  Procedure: COLONOSCOPY WITH PROPOFOL;  Surgeon: Earline Mayotte, MD;  Location: [facility] ENDOSCOPY;  Service:               Endoscopy;  Laterality: N/A; 01/14/2019: KNEE ARTHROSCOPY; Left     Comment:  Procedure: ARTHROSCOPY KNEE WITH DEBRIDEMENT AND PARTIAL              MEDIAL MENISCECTOMY;  Surgeon: Christena Flake, MD;                Location: [facility] ORS;  Service: Orthopedics;  Laterality:               Left; 11/27/2016: KNEE ARTHROSCOPY WITH MEDIAL MENISECTOMY; Right     Comment:  Procedure: KNEE ARTHROSCOPY WITH MEDIAL MENISECTOMY;                Surgeon:  Christena Flake, MD;  Location: [facility] SURGERY               [facility];  Service: Orthopedics;  Laterality: Right; No date: SINUS SURGERY WITH INSTATRAK No date: VASECTOMY BMI    Body Mass Index: 37.13 kg/m     Reproductive/Obstetrics negative OB ROS                             Anesthesia Physical Anesthesia Plan  ASA: 3  Anesthesia Plan: Spinal   Post-op Pain Management:    Induction:   PONV Risk Score and Plan: 2  Airway Management Planned:   Additional Equipment:   Intra-op Plan:   Post-operative Plan:   Informed Consent: I have reviewed the patients History and Physical, chart, labs and discussed the procedure including the risks, benefits and alternatives for the proposed anesthesia with the patient or authorized representative who has indicated his/her understanding and acceptance.     Dental Advisory Given  Plan Discussed with: CRNA  Anesthesia Plan Comments:        Anesthesia Quick Evaluation

## 2022-11-29 DIAGNOSIS — Z471 Aftercare following joint replacement surgery: Secondary | ICD-10-CM | POA: Diagnosis not present

## 2022-11-29 DIAGNOSIS — N400 Enlarged prostate without lower urinary tract symptoms: Secondary | ICD-10-CM | POA: Diagnosis not present

## 2022-11-29 DIAGNOSIS — E785 Hyperlipidemia, unspecified: Secondary | ICD-10-CM | POA: Diagnosis not present

## 2022-11-29 DIAGNOSIS — Z9181 History of falling: Secondary | ICD-10-CM | POA: Diagnosis not present

## 2022-11-29 DIAGNOSIS — M1612 Unilateral primary osteoarthritis, left hip: Secondary | ICD-10-CM | POA: Diagnosis not present

## 2022-11-29 DIAGNOSIS — Z7901 Long term (current) use of anticoagulants: Secondary | ICD-10-CM | POA: Diagnosis not present

## 2022-11-29 DIAGNOSIS — I10 Essential (primary) hypertension: Secondary | ICD-10-CM | POA: Diagnosis not present

## 2022-11-29 DIAGNOSIS — Z96651 Presence of right artificial knee joint: Secondary | ICD-10-CM | POA: Diagnosis not present

## 2022-11-29 DIAGNOSIS — L409 Psoriasis, unspecified: Secondary | ICD-10-CM | POA: Diagnosis not present

## 2022-12-09 DIAGNOSIS — Z471 Aftercare following joint replacement surgery: Secondary | ICD-10-CM | POA: Diagnosis not present

## 2022-12-12 ENCOUNTER — Ambulatory Visit: Payer: Medicare Other | Admitting: Internal Medicine

## 2022-12-13 DIAGNOSIS — Z96651 Presence of right artificial knee joint: Secondary | ICD-10-CM | POA: Diagnosis not present

## 2022-12-13 DIAGNOSIS — M25561 Pain in right knee: Secondary | ICD-10-CM | POA: Diagnosis not present

## 2022-12-16 DIAGNOSIS — M25561 Pain in right knee: Secondary | ICD-10-CM | POA: Diagnosis not present

## 2022-12-16 DIAGNOSIS — Z96651 Presence of right artificial knee joint: Secondary | ICD-10-CM | POA: Diagnosis not present

## 2022-12-18 ENCOUNTER — Other Ambulatory Visit: Payer: Self-pay | Admitting: Internal Medicine

## 2022-12-18 DIAGNOSIS — M25561 Pain in right knee: Secondary | ICD-10-CM | POA: Diagnosis not present

## 2022-12-18 DIAGNOSIS — Z96651 Presence of right artificial knee joint: Secondary | ICD-10-CM | POA: Diagnosis not present

## 2022-12-18 DIAGNOSIS — E1169 Type 2 diabetes mellitus with other specified complication: Secondary | ICD-10-CM

## 2022-12-20 DIAGNOSIS — Z96651 Presence of right artificial knee joint: Secondary | ICD-10-CM | POA: Diagnosis not present

## 2022-12-20 DIAGNOSIS — M25561 Pain in right knee: Secondary | ICD-10-CM | POA: Diagnosis not present

## 2022-12-27 DIAGNOSIS — Z96651 Presence of right artificial knee joint: Secondary | ICD-10-CM | POA: Diagnosis not present

## 2022-12-27 DIAGNOSIS — M25561 Pain in right knee: Secondary | ICD-10-CM | POA: Diagnosis not present

## 2023-01-01 DIAGNOSIS — Z96651 Presence of right artificial knee joint: Secondary | ICD-10-CM | POA: Diagnosis not present

## 2023-01-03 ENCOUNTER — Encounter: Payer: Self-pay | Admitting: Surgery

## 2023-01-03 DIAGNOSIS — Z96651 Presence of right artificial knee joint: Secondary | ICD-10-CM | POA: Diagnosis not present

## 2023-01-03 DIAGNOSIS — M25561 Pain in right knee: Secondary | ICD-10-CM | POA: Diagnosis not present

## 2023-01-08 DIAGNOSIS — Z96651 Presence of right artificial knee joint: Secondary | ICD-10-CM | POA: Diagnosis not present

## 2023-01-08 DIAGNOSIS — M25561 Pain in right knee: Secondary | ICD-10-CM | POA: Diagnosis not present

## 2023-01-10 DIAGNOSIS — M25561 Pain in right knee: Secondary | ICD-10-CM | POA: Diagnosis not present

## 2023-01-10 DIAGNOSIS — Z96651 Presence of right artificial knee joint: Secondary | ICD-10-CM | POA: Diagnosis not present

## 2023-01-14 DIAGNOSIS — M25561 Pain in right knee: Secondary | ICD-10-CM | POA: Diagnosis not present

## 2023-01-14 DIAGNOSIS — Z96651 Presence of right artificial knee joint: Secondary | ICD-10-CM | POA: Diagnosis not present

## 2023-01-15 ENCOUNTER — Telehealth: Payer: Self-pay | Admitting: Cardiology

## 2023-01-15 NOTE — Telephone Encounter (Signed)
I spoke with the patient. He advised that he had just started Ozempic 2 mg prior to his knee surgery. His last dose was 11/18/22. He would like to know about resuming this.  I have advised the patient as I am not as familiar with the Ozempic, I will need to review with Dr. Garen Lah to find out if he will need to start at a baseline dose after being off the medication for ~ 8 weeks.  He is aware we will reach back out to him once further recommendations are received from Dr. Garen Lah. The patient voices understanding and is agreeable.  He is using [facility] in [place].

## 2023-01-15 NOTE — Telephone Encounter (Signed)
Pt c/o medication issue:  1. Name of Medication: Ozempic   2. How are you currently taking this medication (dosage and times per day)? Stopped taking 11/18/22 a week before knee surgery   3. Are you having a reaction (difficulty breathing--STAT)?   4. What is your medication issue? Pt would like to resume this medication and would like a call back to discuss further.

## 2023-01-17 DIAGNOSIS — Z96651 Presence of right artificial knee joint: Secondary | ICD-10-CM | POA: Diagnosis not present

## 2023-01-17 MED ORDER — OZEMPIC (2 MG/DOSE) 8 MG/3ML ~~LOC~~ SOPN: 1.0000 | PEN_INJECTOR | SUBCUTANEOUS | 5 refills | Status: DC

## 2023-01-17 NOTE — Telephone Encounter (Signed)
Called patient.  Made patient aware of Dr. Garen Lah recommendations.  Patient requested refill of his Ozempic Ozempic sent.

## 2023-01-20 DIAGNOSIS — L400 Psoriasis vulgaris: Secondary | ICD-10-CM | POA: Diagnosis not present

## 2023-01-20 DIAGNOSIS — Z79899 Other long term (current) drug therapy: Secondary | ICD-10-CM | POA: Diagnosis not present

## 2023-01-20 DIAGNOSIS — L408 Other psoriasis: Secondary | ICD-10-CM | POA: Diagnosis not present

## 2023-01-22 DIAGNOSIS — Z96651 Presence of right artificial knee joint: Secondary | ICD-10-CM | POA: Diagnosis not present

## 2023-01-28 DIAGNOSIS — M25561 Pain in right knee: Secondary | ICD-10-CM | POA: Diagnosis not present

## 2023-01-28 DIAGNOSIS — Z96651 Presence of right artificial knee joint: Secondary | ICD-10-CM | POA: Diagnosis not present

## 2023-01-30 DIAGNOSIS — M25561 Pain in right knee: Secondary | ICD-10-CM | POA: Diagnosis not present

## 2023-01-30 DIAGNOSIS — Z96651 Presence of right artificial knee joint: Secondary | ICD-10-CM | POA: Diagnosis not present

## 2023-02-04 DIAGNOSIS — M25561 Pain in right knee: Secondary | ICD-10-CM | POA: Diagnosis not present

## 2023-02-04 DIAGNOSIS — Z96651 Presence of right artificial knee joint: Secondary | ICD-10-CM | POA: Diagnosis not present

## 2023-02-06 DIAGNOSIS — Z96651 Presence of right artificial knee joint: Secondary | ICD-10-CM | POA: Diagnosis not present

## 2023-02-07 ENCOUNTER — Other Ambulatory Visit: Payer: Self-pay | Admitting: *Deleted

## 2023-02-07 DIAGNOSIS — E1169 Type 2 diabetes mellitus with other specified complication: Secondary | ICD-10-CM

## 2023-02-07 MED ORDER — ATORVASTATIN CALCIUM 10 MG PO TABS: ORAL_TABLET | ORAL | 0 refills | Status: DC

## 2023-02-11 DIAGNOSIS — Z96651 Presence of right artificial knee joint: Secondary | ICD-10-CM | POA: Diagnosis not present

## 2023-02-11 DIAGNOSIS — M25561 Pain in right knee: Secondary | ICD-10-CM | POA: Diagnosis not present

## 2023-02-14 ENCOUNTER — Ambulatory Visit: Payer: Medicare Other | Admitting: Cardiology

## 2023-02-26 DIAGNOSIS — K080 Exfoliation of teeth due to systemic causes: Secondary | ICD-10-CM | POA: Diagnosis not present

## 2023-02-28 ENCOUNTER — Ambulatory Visit: Payer: Medicare Other | Admitting: Cardiology

## 2023-03-03 DIAGNOSIS — E118 Type 2 diabetes mellitus with unspecified complications: Secondary | ICD-10-CM | POA: Diagnosis not present

## 2023-03-03 DIAGNOSIS — M1711 Unilateral primary osteoarthritis, right knee: Secondary | ICD-10-CM | POA: Diagnosis not present

## 2023-03-03 DIAGNOSIS — Z96651 Presence of right artificial knee joint: Secondary | ICD-10-CM | POA: Diagnosis not present

## 2023-03-07 ENCOUNTER — Encounter: Payer: Medicare Other | Admitting: Internal Medicine

## 2023-03-13 ENCOUNTER — Telehealth: Payer: Self-pay | Admitting: Cardiology

## 2023-03-13 ENCOUNTER — Other Ambulatory Visit: Payer: Self-pay

## 2023-03-13 ENCOUNTER — Encounter: Payer: Medicare Other | Admitting: Internal Medicine

## 2023-03-13 MED ORDER — OZEMPIC (2 MG/DOSE) 8 MG/3ML ~~LOC~~ SOPN: 1.0000 | PEN_INJECTOR | SUBCUTANEOUS | 0 refills | Status: DC

## 2023-03-13 NOTE — Telephone Encounter (Signed)
Pt c/o medication issue:  1. Name of Medication: Semaglutide, 2 MG/DOSE, (OZEMPIC, 2 MG/DOSE,) 8 MG/3ML SOPN   2. How are you currently taking this medication (dosage and times per day)?   MG/3ML SOPN Inject 1 Pen into the skin 2 (two) times a week.    3. Are you having a reaction (difficulty breathing--STAT)? No   4. What is your medication issue? [facility] is calling to get clarification on the dosage. States that is normally just once a week not two. Please advise

## 2023-03-13 NOTE — Telephone Encounter (Signed)
Pharmacy called to verify order of Semaglutide, 2 MG/DOSE, (OZEMPIC, 2 MG/DOSE,) 8 MG/3ML SOPN. Informed staff that it is once weekly and not twice. Updated in [facility]

## 2023-03-14 ENCOUNTER — Ambulatory Visit (INDEPENDENT_AMBULATORY_CARE_PROVIDER_SITE_OTHER): Payer: Medicare Other | Admitting: Internal Medicine

## 2023-03-14 ENCOUNTER — Encounter: Payer: Self-pay | Admitting: Internal Medicine

## 2023-03-14 VITALS — BP 112/78 | HR 80 | Ht 76.0 in | Wt 289.0 lb

## 2023-03-14 DIAGNOSIS — Z0000 Encounter for general adult medical examination without abnormal findings: Secondary | ICD-10-CM

## 2023-03-14 DIAGNOSIS — E785 Hyperlipidemia, unspecified: Secondary | ICD-10-CM

## 2023-03-14 DIAGNOSIS — Z23 Encounter for immunization: Secondary | ICD-10-CM | POA: Diagnosis not present

## 2023-03-14 DIAGNOSIS — E118 Type 2 diabetes mellitus with unspecified complications: Secondary | ICD-10-CM

## 2023-03-14 DIAGNOSIS — E1169 Type 2 diabetes mellitus with other specified complication: Secondary | ICD-10-CM

## 2023-03-14 DIAGNOSIS — N138 Other obstructive and reflux uropathy: Secondary | ICD-10-CM

## 2023-03-14 DIAGNOSIS — N401 Enlarged prostate with lower urinary tract symptoms: Secondary | ICD-10-CM

## 2023-03-14 DIAGNOSIS — I10 Essential (primary) hypertension: Secondary | ICD-10-CM | POA: Diagnosis not present

## 2023-03-14 NOTE — Patient Instructions (Signed)
Please schedule your eye exam with Dr. Ellin Mayhew.

## 2023-03-14 NOTE — Assessment & Plan Note (Addendum)
Seen by Urology in the past.  Not on any medications. He has occasional nocturia depending on his fluid intake. No obstructive symptoms   Lab Results  Component Value Date   PSA1 1.3 03/28/2022   PSA1 1.6 03/26/2021   PSA1 1.2 03/24/2020   PSA 0.9 04/25/2015

## 2023-03-14 NOTE — Assessment & Plan Note (Addendum)
Tolerating statin medications without concerns LDL is  Lab Results  Component Value Date   LDLCALC 106 (H) 03/28/2022  On atorvastatin 10 mg. with a goal of < 70. Current dose will be adjusted if needed.

## 2023-03-14 NOTE — Assessment & Plan Note (Addendum)
Clinically stable without s/s of hypoglycemia.  Diet controlled at last visit. Ozempic prescribed by Cardiology.  He has lost about 25 lbs. Lab Results  Component Value Date   HGBA1C 6.5 (H) 08/12/2022  Due for Eye exam and U/Cr Foot exam done today

## 2023-03-14 NOTE — Assessment & Plan Note (Signed)
Clinically stable exam with well controlled BP on lisinopril and hctz. Tolerating medications without side effects. Pt to continue current regimen and low sodium diet.  

## 2023-03-14 NOTE — Progress Notes (Signed)
Date:  03/14/2023   Name:  Benjamin Medina   DOB:  01/03/1951   MRN:  [id number]   Chief Complaint: Annual Exam Benjamin Medina is a 72 y.o. male who presents today for his Complete Annual Exam. He feels well. He reports exercising walking 3 days a week. He reports he is sleeping well.   Colonoscopy: 04/2016 repeat 10 yr  Immunization History  Administered Date(s) Administered   COVID-19, mRNA, vaccine(Comirnaty)12 years and older 03/14/2023   Fluad Quad(high Dose 65+) 09/25/2020, 03/14/2023   Influenza,inj,Quad PF,6+ Mos 12/10/2015, 10/15/2018, 10/09/2019   Influenza-Unspecified 09/27/2021   Moderna Sars-Covid-2 Vaccination 11/29/2020, 04/19/2021   PFIZER(Purple Top)SARS-COV-2 Vaccination 02/13/2020, 03/07/2020   Pneumococcal Conjugate-13 10/15/2018   Pneumococcal Polysaccharide-23 09/25/2020   Tdap 05/08/2011   Zoster, Live 05/08/2011   Health Maintenance Due  Topic Date Due   Zoster Vaccines- Shingrix (1 of 2) Never done   DTaP/Tdap/Td (2 - Td or Tdap) 05/07/2021   OPHTHALMOLOGY EXAM  12/03/2022   HEMOGLOBIN A1C  02/12/2023   Medicare Annual Wellness (AWV)  03/14/2023   Diabetic kidney evaluation - Urine ACR  03/29/2023    Lab Results  Component Value Date   PSA1 1.3 03/28/2022   PSA1 1.6 03/26/2021   PSA1 1.2 03/24/2020   PSA 0.9 04/25/2015     Hypertension This is a chronic problem. The problem is controlled. Pertinent negatives include no chest pain, headaches, palpitations or shortness of breath.  Hyperlipidemia This is a chronic problem. The problem is uncontrolled. Pertinent negatives include no chest pain, myalgias or shortness of breath. Current antihyperlipidemic treatment includes statins. The current treatment provides moderate improvement of lipids.  Diabetes He presents for his follow-up diabetic visit. He has type 2 diabetes mellitus. His disease course has been stable. Pertinent negatives for hypoglycemia include no dizziness, headaches or  nervousness/anxiousness. Pertinent negatives for diabetes include no chest pain and no fatigue. Current diabetic treatments: ozempic prescribed by Cardiology - has lost 24 lbs.    Lab Results  Component Value Date   NA 137 11/19/2022   K 3.9 11/19/2022   CO2 20 (L) 11/19/2022   GLUCOSE 166 (H) 11/19/2022   BUN 17 11/19/2022   CREATININE 0.98 11/19/2022   CALCIUM 9.5 11/19/2022   EGFR 88 08/12/2022   GFRNONAA >60 11/19/2022   Lab Results  Component Value Date   CHOL 176 03/28/2022   HDL 45 03/28/2022   LDLCALC 106 (H) 03/28/2022   TRIG 143 03/28/2022   CHOLHDL 3.9 03/28/2022   Lab Results  Component Value Date   TSH 1.040 07/09/2022   Lab Results  Component Value Date   HGBA1C 6.5 (H) 08/12/2022   Lab Results  Component Value Date   WBC 7.7 11/19/2022   HGB 14.7 11/19/2022   HCT 43.5 11/19/2022   MCV 91.6 11/19/2022   PLT 270 11/19/2022   Lab Results  Component Value Date   ALT 29 11/19/2022   AST 26 11/19/2022   ALKPHOS 63 11/19/2022   BILITOT 0.7 11/19/2022   No results found for: "25OHVITD2", "25OHVITD3", "VD25OH"   Review of Systems  Constitutional:  Positive for unexpected weight change (since starting ozempic). Negative for appetite change, chills, diaphoresis and fatigue.  HENT:  Negative for hearing loss, tinnitus, trouble swallowing and voice change.   Eyes:  Negative for visual disturbance.  Respiratory:  Negative for choking, shortness of breath and wheezing.   Cardiovascular:  Negative for chest pain, palpitations and leg swelling.  Gastrointestinal:  Negative for  abdominal pain, blood in stool, constipation and diarrhea.  Genitourinary:  Positive for frequency (nocturia 1-4 times per night). Negative for difficulty urinating and dysuria.  Musculoskeletal:  Positive for arthralgias (right knee surgery improving). Negative for back pain and myalgias.  Skin:  Negative for color change and rash.  Neurological:  Negative for dizziness, syncope and  headaches.  Hematological:  Negative for adenopathy.  Psychiatric/Behavioral:  Negative for dysphoric mood and sleep disturbance. The patient is not nervous/anxious.     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   Generalized psoriasis 11/04/2015   Periodic limb movement 11/04/2015    No Known Allergies  Past Surgical History:  Procedure Laterality Date   BLEPHAROPLASTY Bilateral 12/30/2014   COLONOSCOPY WITH PROPOFOL N/A 05/01/2016   Procedure: COLONOSCOPY WITH PROPOFOL;  Surgeon: Robert Bellow, MD;  Location: [facility] ENDOSCOPY;  Service: Endoscopy;  Laterality: N/A;   KNEE ARTHROSCOPY Left 01/14/2019   Procedure: ARTHROSCOPY KNEE WITH DEBRIDEMENT AND PARTIAL MEDIAL MENISCECTOMY;  Surgeon: Corky Mull, MD;  Location: [facility] ORS;  Service: Orthopedics;  Laterality: Left;   KNEE ARTHROSCOPY WITH MEDIAL MENISECTOMY Right 11/27/2016   Procedure: KNEE ARTHROSCOPY WITH MEDIAL MENISECTOMY;  Surgeon: Corky Mull, MD;  Location: [facility];  Service: Orthopedics;  Laterality: Right;   SINUS SURGERY WITH INSTATRAK     TOTAL KNEE ARTHROPLASTY Right 11/28/2022   Procedure: TOTAL KNEE ARTHROPLASTY;  Surgeon: Corky Mull, MD;  Location: [facility] ORS;  Service: Orthopedics;  Laterality: Right;   VASECTOMY      Social History   Tobacco Use   Smoking status: Former    Types: Cigarettes    Quit date: 12/30/1985    Years since quitting: 37.2   Smokeless tobacco: Never  Vaping Use   Vaping Use: Never used  Substance Use Topics   Alcohol use: Yes    Alcohol/week: 4.0 standard drinks of alcohol    Types: 4  Standard drinks or equivalent per week    Comment: social   Drug use: No     Medication list has been reviewed and updated.  Current Meds  Medication Sig   atorvastatin (LIPITOR) 10 MG tablet TAKE 1 TABLET(10 MG) BY MOUTH DAILY   clobetasol (TEMOVATE) 0.05 % GEL Apply 1 application topically 2 (two) times daily.   hydrochlorothiazide (MICROZIDE) 12.5 MG capsule Take 1 capsule (12.5 mg total) by mouth daily.   lisinopril (ZESTRIL) 40 MG tablet TAKE 1 TABLET BY MOUTH EVERY DAY   Semaglutide, 2 MG/DOSE, (OZEMPIC, 2 MG/DOSE,) 8 MG/3ML SOPN Inject 1 Pen into the skin once a week.   triamcinolone (KENALOG) 0.025 % cream Apply topically 2 (two) times daily.   [DISCONTINUED] apixaban (ELIQUIS) 2.5 MG TABS tablet Take 1 tablet (2.5 mg total) by mouth 2 (two) times daily. (Patient taking differently: Take 2.5 mg by mouth daily.)   [DISCONTINUED] oxyCODONE (ROXICODONE) 5 MG immediate release tablet Take 1-2 tablets (5-10 mg total) by mouth every 4 (four) hours as needed for moderate pain or severe pain.       03/14/2023   11:09 AM 08/12/2022   11:28 AM 07/09/2022   10:50 AM 03/28/2022  8:22 AM  GAD 7 : Generalized Anxiety Score  Nervous, Anxious, on Edge 0 0 1 1  Control/stop worrying 0 0 1 1  Worry too much - different things 0 0 0 1  Trouble relaxing 0 0 0 0  Restless 0 0 0 0  Easily annoyed or irritable 1 0 2 1  Afraid - awful might happen 0 0 1 1  Total GAD 7 Score 1 0 5 5  Anxiety Difficulty Not difficult at all Not difficult at all Not difficult at all        03/14/2023   11:09 AM 08/12/2022   11:27 AM 07/09/2022   10:50 AM  Depression screen PHQ 2/9  Decreased Interest 1 0 1  Down, Depressed, Hopeless 0 0 1  PHQ - 2 Score 1 0 2  Altered sleeping 1 0 1  Tired, decreased energy 1 2 3   Change in appetite 0 0 0  Feeling bad or failure about yourself  0 0 0  Trouble concentrating 0 0 0  Moving slowly or fidgety/restless 0 0 0  Suicidal thoughts 0 0 0  PHQ-9 Score 3 2 6    Difficult doing work/chores Not difficult at all Not difficult at all Not difficult at all    BP Readings from Last 3 Encounters:  03/14/23 112/78  11/28/22 133/69  11/19/22 (!) 150/89    Physical Exam Vitals and nursing note reviewed.  Constitutional:      Appearance: Normal appearance. He is well-developed.  HENT:     Head: Normocephalic.     Right Ear: Tympanic membrane, ear canal and external ear normal.     Left Ear: Tympanic membrane, ear canal and external ear normal.     Nose: Nose normal.  Eyes:     Conjunctiva/sclera: Conjunctivae normal.     Pupils: Pupils are equal, round, and reactive to light.  Neck:     Thyroid: No thyromegaly.     Vascular: No carotid bruit.  Cardiovascular:     Rate and Rhythm: Normal rate and regular rhythm.     Heart sounds: Normal heart sounds.  Pulmonary:     Effort: Pulmonary effort is normal.     Breath sounds: Normal breath sounds. No wheezing.  Chest:  Breasts:    Right: No mass.     Left: No mass.  Abdominal:     General: Bowel sounds are normal.     Palpations: Abdomen is soft.     Tenderness: There is no abdominal tenderness.  Musculoskeletal:        General: Normal range of motion.     Cervical back: Normal range of motion and neck supple.  Lymphadenopathy:     Cervical: No cervical adenopathy.  Skin:    General: Skin is warm and dry.  Neurological:     Mental Status: He is alert and oriented to person, place, and time.     Deep Tendon Reflexes: Reflexes are normal and symmetric.  Psychiatric:        Attention and Perception: Attention normal.        Mood and Affect: Mood normal.        Thought Content: Thought content normal.    Diabetic Foot Exam - Simple   Simple Foot Form Diabetic Foot exam was performed with the following findings: Yes 03/14/2023 11:23 AM  Visual Inspection No deformities, no ulcerations, no other skin breakdown bilaterally: Yes Sensation Testing Intact to touch and monofilament testing  bilaterally: Yes Pulse Check Posterior Tibialis and  Dorsalis pulse intact bilaterally: Yes Comments      Wt Readings from Last 3 Encounters:  03/14/23 289 lb (131.1 kg)  11/28/22 (!) 305 lb (138.3 kg)  11/19/22 (!) 305 lb 9.6 oz (138.6 kg)    BP 112/78   Pulse 80   Ht 6\' 4"  (1.93 m)   Wt 289 lb (131.1 kg)   SpO2 99%   BMI 35.18 kg/m   Assessment and Plan:  Problem List Items Addressed This Visit       Cardiovascular and Mediastinum   Essential hypertension (Chronic)    Clinically stable exam with well controlled BP on lisinopril and hctz. Tolerating medications without side effects. Pt to continue current regimen and low sodium diet.       Relevant Orders   CBC with Differential/Platelet     Endocrine   DM type 2, controlled, with complication ([place]) (Chronic)    Clinically stable without s/s of hypoglycemia.  Diet controlled at last visit. Ozempic prescribed by Cardiology.  He has lost about 25 lbs. Lab Results  Component Value Date   HGBA1C 6.5 (H) 08/12/2022  Due for Eye exam and U/Cr Foot exam done today       Relevant Orders   Comprehensive metabolic panel   Hemoglobin A1c   Microalbumin / creatinine urine ratio   Hyperlipidemia associated with type 2 diabetes mellitus ([facility]) (Chronic)    Tolerating statin medications without concerns LDL is  Lab Results  Component Value Date   LDLCALC 106 (H) 03/28/2022  On atorvastatin 10 mg. with a goal of < 70. Current dose will be adjusted if needed.       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  Not on any medications. He has occasional nocturia depending on his fluid intake. No obstructive symptoms   Lab Results  Component Value Date   PSA1 1.3 03/28/2022   PSA1 1.6 03/26/2021   PSA1 1.2 03/24/2020   PSA 0.9 04/25/2015         Relevant Orders   PSA   Other Visit Diagnoses      Annual physical exam    -  Primary   up to date on screenings; immunizations updated today continue weight loss/dietary modifications   Need for immunization against influenza       Relevant Orders   Flu Vaccine QUAD High Dose(Fluad) (Completed)   Encounter for immunization       Relevant Orders   Pfizer Fall 2023 Covid-19 Vaccine 103yrs and older (Completed)       Return in about 4 months (around 07/14/2023) for DM, HTN.   Partially dictated using Dexter, any errors are not intentional.  Glean Hess, MD [place], [place]

## 2023-03-17 LAB — COMPREHENSIVE METABOLIC PANEL
ALT: 32 IU/L (ref 0–44)
AST: 17 IU/L (ref 0–40)
Albumin/Globulin Ratio: 1.6 (ref 1.2–2.2)
Albumin: 4.6 g/dL (ref 3.9–4.9)
Alkaline Phosphatase: 97 IU/L (ref 44–121)
BUN/Creatinine Ratio: 13 (ref 10–24)
BUN: 14 mg/dL (ref 8–27)
Bilirubin Total: <0.2 mg/dL (ref 0.0–1.2)
CO2: 20 mmol/L (ref 20–29)
Calcium: 9.7 mg/dL (ref 8.6–10.2)
Chloride: 106 mmol/L (ref 96–106)
Creatinine, Ser: 1.06 mg/dL (ref 0.76–1.27)
Globulin, Total: 2.8 g/dL (ref 1.5–4.5)
Glucose: 94 mg/dL (ref 70–99)
Potassium: 4.9 mmol/L (ref 3.5–5.2)
Sodium: 142 mmol/L (ref 134–144)
Total Protein: 7.4 g/dL (ref 6.0–8.5)
eGFR: 75 mL/min/{1.73_m2} (ref >59)

## 2023-03-17 LAB — LIPID PANEL
Chol/HDL Ratio: 3.8 ratio (ref 0.0–5.0)
Cholesterol, Total: 150 mg/dL (ref 100–199)
HDL: 39 mg/dL — ABNORMAL LOW (ref >39)
LDL Chol Calc (NIH): 77 mg/dL (ref 0–99)
Triglycerides: 202 mg/dL — ABNORMAL HIGH (ref 0–149)
VLDL Cholesterol Cal: 34 mg/dL (ref 5–40)

## 2023-03-17 LAB — CBC WITH DIFFERENTIAL/PLATELET
Basophils Absolute: 0 10*3/uL (ref 0.0–0.2)
Basos: 1 %
EOS (ABSOLUTE): 0.2 10*3/uL (ref 0.0–0.4)
Eos: 3 %
Hematocrit: 43.9 % (ref 37.5–51.0)
Hemoglobin: 14.5 g/dL (ref 13.0–17.7)
Immature Grans (Abs): 0 10*3/uL (ref 0.0–0.1)
Immature Granulocytes: 0 %
Lymphocytes Absolute: 1.9 10*3/uL (ref 0.7–3.1)
Lymphs: 21 %
MCH: 30.8 pg (ref 26.6–33.0)
MCHC: 33 g/dL (ref 31.5–35.7)
MCV: 93 fL (ref 79–97)
Monocytes Absolute: 1.2 10*3/uL — ABNORMAL HIGH (ref 0.1–0.9)
Monocytes: 14 %
Neutrophils Absolute: 5.5 10*3/uL (ref 1.4–7.0)
Neutrophils: 61 %
Platelets: 356 10*3/uL (ref 150–450)
RBC: 4.71 x10E6/uL (ref 4.14–5.80)
RDW: 13.5 % (ref 11.6–15.4)
WBC: 8.8 10*3/uL (ref 3.4–10.8)

## 2023-03-17 LAB — MICROALBUMIN / CREATININE URINE RATIO
Creatinine, Urine: 170.9 mg/dL
Microalb/Creat Ratio: 64 mg/g creat — ABNORMAL HIGH (ref 0–29)
Microalbumin, Urine: 109.2 ug/mL

## 2023-03-17 LAB — HEMOGLOBIN A1C
Est. average glucose Bld gHb Est-mCnc: 134 mg/dL
Hgb A1c MFr Bld: 6.3 % — ABNORMAL HIGH (ref 4.8–5.6)

## 2023-03-17 LAB — PSA: Prostate Specific Ag, Serum: 1.8 ng/mL (ref 0.0–4.0)

## 2023-03-19 ENCOUNTER — Telehealth: Payer: Self-pay | Admitting: Cardiology

## 2023-03-19 ENCOUNTER — Ambulatory Visit (INDEPENDENT_AMBULATORY_CARE_PROVIDER_SITE_OTHER): Payer: Medicare Other

## 2023-03-19 VITALS — Ht 76.0 in | Wt 289.0 lb

## 2023-03-19 DIAGNOSIS — Z0000 Encounter for general adult medical examination without abnormal findings: Secondary | ICD-10-CM

## 2023-03-19 DIAGNOSIS — I10 Essential (primary) hypertension: Secondary | ICD-10-CM

## 2023-03-19 MED ORDER — LISINOPRIL 40 MG PO TABS: ORAL_TABLET | ORAL | 0 refills | Status: DC

## 2023-03-19 NOTE — Patient Instructions (Signed)
[name] , Thank you for taking time to come for your Medicare Wellness Visit. I appreciate your ongoing commitment to your health goals. Please review the following plan we discussed and let me know if I can assist you in the future.   These are the goals we discussed:  Goals      DIET - EAT MORE FRUITS AND VEGETABLES     DIET - INCREASE WATER INTAKE     Recommend drinking 6-8 glasses of water per day         This is a list of the screening recommended for you and due dates:  Health Maintenance  Topic Date Due   Zoster (Shingles) Vaccine (1 of 2) Never done   DTaP/Tdap/Td vaccine (2 - Td or Tdap) 05/07/2021   Eye exam for diabetics  12/03/2022   Hemoglobin A1C  09/14/2023   Yearly kidney function blood test for diabetes  03/13/2024   Yearly kidney health urinalysis for diabetes  03/13/2024   Complete foot exam   03/13/2024   Medicare Annual Wellness Visit  03/18/2024   Colon Cancer Screening  05/01/2026   Pneumonia Vaccine  Completed   Flu Shot  Completed   COVID-19 Vaccine  Completed   Hepatitis C Screening: USPSTF Recommendation to screen - Ages 18-79 yo.  Completed   HPV Vaccine  Aged Out    Advanced directives: no  Conditions/risks identified: none  Next appointment: Follow up in one year for your annual wellness visit. 03/24/24 @ 10:15 am by phone  Preventive Care 65 Years and Older, Male  Preventive care refers to lifestyle choices and visits with your health care provider that can promote health and wellness. What does preventive care include? A yearly physical exam. This is also called an annual well check. Dental exams once or twice a year. Routine eye exams. Ask your health care provider how often you should have your eyes checked. Personal lifestyle choices, including: Daily care of your teeth and gums. Regular physical activity. Eating a healthy diet. Avoiding tobacco and drug use. Limiting alcohol use. Practicing safe sex. Taking low doses of aspirin  every day. Taking vitamin and mineral supplements as recommended by your health care provider. What happens during an annual well check? The services and screenings done by your health care provider during your annual well check will depend on your age, overall health, lifestyle risk factors, and family history of disease. Counseling  Your health care provider may ask you questions about your: Alcohol use. Tobacco use. Drug use. Emotional well-being. Home and relationship well-being. Sexual activity. Eating habits. History of falls. Memory and ability to understand (cognition). Work and work Statistician. Screening  You may have the following tests or measurements: Height, weight, and BMI. Blood pressure. Lipid and cholesterol levels. These may be checked every 5 years, or more frequently if you are over 30 years old. Skin check. Lung cancer screening. You may have this screening every year starting at age 66 if you have a 30-pack-year history of smoking and currently smoke or have quit within the past 15 years. Fecal occult blood test (FOBT) of the stool. You may have this test every year starting at age 44. Flexible sigmoidoscopy or colonoscopy. You may have a sigmoidoscopy every 5 years or a colonoscopy every 10 years starting at age 105. Prostate cancer screening. Recommendations will vary depending on your family history and other risks. Hepatitis C blood test. Hepatitis B blood test. Sexually transmitted disease (STD) testing. Diabetes screening. This is  done by checking your blood sugar (glucose) after you have not eaten for a while (fasting). You may have this done every 1-3 years. Abdominal aortic aneurysm (AAA) screening. You may need this if you are a current or former smoker. Osteoporosis. You may be screened starting at age 55 if you are at high risk. Talk with your health care provider about your test results, treatment options, and if necessary, the need for more  tests. Vaccines  Your health care provider may recommend certain vaccines, such as: Influenza vaccine. This is recommended every year. Tetanus, diphtheria, and acellular pertussis (Tdap, Td) vaccine. You may need a Td booster every 10 years. Zoster vaccine. You may need this after age 50. Pneumococcal 13-valent conjugate (PCV13) vaccine. One dose is recommended after age 84. Pneumococcal polysaccharide (PPSV23) vaccine. One dose is recommended after age 31. Talk to your health care provider about which screenings and vaccines you need and how often you need them. This information is not intended to replace advice given to you by your health care provider. Make sure you discuss any questions you have with your health care provider. Document Released: 01/12/2016 Document Revised: 09/04/2016 Document Reviewed: 10/17/2015 Elsevier Interactive Patient Education  2017 [place] Prevention in the Home Falls can cause injuries. They can happen to people of all ages. There are many things you can do to make your home safe and to help prevent falls. What can I do on the outside of my home? Regularly fix the edges of walkways and driveways and fix any cracks. Remove anything that might make you trip as you walk through a door, such as a raised step or threshold. Trim any bushes or trees on the path to your home. Use bright outdoor lighting. Clear any walking paths of anything that might make someone trip, such as rocks or tools. Regularly check to see if handrails are loose or broken. Make sure that both sides of any steps have handrails. Any raised decks and porches should have guardrails on the edges. Have any leaves, snow, or ice cleared regularly. Use sand or salt on walking paths during winter. Clean up any spills in your garage right away. This includes oil or grease spills. What can I do in the bathroom? Use night lights. Install grab bars by the toilet and in the tub and shower.  Do not use towel bars as grab bars. Use non-skid mats or decals in the tub or shower. If you need to sit down in the shower, use a plastic, non-slip stool. Keep the floor dry. Clean up any water that spills on the floor as soon as it happens. Remove soap buildup in the tub or shower regularly. Attach bath mats securely with double-sided non-slip rug tape. Do not have throw rugs and other things on the floor that can make you trip. What can I do in the bedroom? Use night lights. Make sure that you have a light by your bed that is easy to reach. Do not use any sheets or blankets that are too big for your bed. They should not hang down onto the floor. Have a firm chair that has side arms. You can use this for support while you get dressed. Do not have throw rugs and other things on the floor that can make you trip. What can I do in the kitchen? Clean up any spills right away. Avoid walking on wet floors. Keep items that you use a lot in easy-to-reach places. If you need  to reach something above you, use a strong step stool that has a grab bar. Keep electrical cords out of the way. Do not use floor polish or wax that makes floors slippery. If you must use wax, use non-skid floor wax. Do not have throw rugs and other things on the floor that can make you trip. What can I do with my stairs? Do not leave any items on the stairs. Make sure that there are handrails on both sides of the stairs and use them. Fix handrails that are broken or loose. Make sure that handrails are as long as the stairways. Check any carpeting to make sure that it is firmly attached to the stairs. Fix any carpet that is loose or worn. Avoid having throw rugs at the top or bottom of the stairs. If you do have throw rugs, attach them to the floor with carpet tape. Make sure that you have a light switch at the top of the stairs and the bottom of the stairs. If you do not have them, ask someone to add them for you. What else  can I do to help prevent falls? Wear shoes that: Do not have high heels. Have rubber bottoms. Are comfortable and fit you well. Are closed at the toe. Do not wear sandals. If you use a stepladder: Make sure that it is fully opened. Do not climb a closed stepladder. Make sure that both sides of the stepladder are locked into place. Ask someone to hold it for you, if possible. Clearly mark and make sure that you can see: Any grab bars or handrails. First and last steps. Where the edge of each step is. Use tools that help you move around (mobility aids) if they are needed. These include: Canes. Walkers. Scooters. Crutches. Turn on the lights when you go into a dark area. Replace any light bulbs as soon as they burn out. Set up your furniture so you have a clear path. Avoid moving your furniture around. If any of your floors are uneven, fix them. If there are any pets around you, be aware of where they are. Review your medicines with your doctor. Some medicines can make you feel dizzy. This can increase your chance of falling. Ask your doctor what other things that you can do to help prevent falls. This information is not intended to replace advice given to you by your health care provider. Make sure you discuss any questions you have with your health care provider. Document Released: 10/12/2009 Document Revised: 05/23/2016 Document Reviewed: 01/20/2015 Elsevier Interactive Patient Education  2017 [employer].

## 2023-03-19 NOTE — Telephone Encounter (Signed)
*  STAT* If patient is at the pharmacy, call can be transferred to refill team.   1. Which medications need to be refilled? (please list name of each medication and dose if known) lisinopril (ZESTRIL) 40 MG tablet   2. Which pharmacy/location (including street and city if local pharmacy) is medication to be sent to? [facility] [place], [address] AT [facility] OF [address] [place]   3. Do they need a 30 day or 90 day supply? 90 day

## 2023-03-19 NOTE — Progress Notes (Signed)
I connected with  De Nurse on 03/19/23 by a audio enabled telemedicine application and verified that I am speaking with the correct person using two identifiers.  Patient Location: Home  Provider Location: Office/Clinic  I discussed the limitations of evaluation and management by telemedicine. The patient expressed understanding and agreed to proceed.  Subjective:   TERRILL MAKOVEC is a 72 y.o. male who presents for Medicare Annual/Subsequent preventive examination.  Review of Systems     Cardiac Risk Factors include: advanced age (>68men, >79 women);diabetes mellitus;male gender;hypertension;dyslipidemia;obesity (BMI >30kg/m2)     Objective:    There were no vitals filed for this visit. There is no height or weight on file to calculate BMI.     03/19/2023   11:31 AM 11/28/2022    6:31 AM 11/19/2022   11:40 AM 03/13/2022   10:49 AM 01/14/2019   11:22 AM 11/27/2016   12:18 PM 06/12/2016   10:39 AM  Advanced Directives  Does Patient Have a Medical Advance Directive? No Yes Yes No No No Yes  Type of Corporate treasurer of [place];Living will Living will    [place];Living will  Copy of [place] in Chart?  No - copy requested       Would patient like information on creating a medical advance directive? No - Patient declined No - Patient declined  Yes (MAU/Ambulatory/Procedural Areas - Information given) No - Patient declined No - Patient declined     Current Medications (verified) Outpatient Encounter Medications as of 03/19/2023  Medication Sig   atorvastatin (LIPITOR) 10 MG tablet TAKE 1 TABLET(10 MG) BY MOUTH DAILY   clobetasol (TEMOVATE) 0.05 % GEL Apply 1 application topically 2 (two) times daily.   hydrochlorothiazide (MICROZIDE) 12.5 MG capsule Take 1 capsule (12.5 mg total) by mouth daily.   lisinopril (ZESTRIL) 40 MG tablet TAKE 1 TABLET BY MOUTH EVERY DAY   Semaglutide, 2 MG/DOSE, (OZEMPIC, 2 MG/DOSE,) 8  MG/3ML SOPN Inject 1 Pen into the skin once a week.   triamcinolone (KENALOG) 0.025 % cream Apply topically 2 (two) times daily.   No facility-administered encounter medications on file as of 03/19/2023.    Allergies (verified) Patient has no known allergies.   History: Past Medical History:  Diagnosis Date   Arthritis    Auditory impairment    BPH with urinary obstruction    Contracture of hand    PALMAR FASCIA   Diabetes mellitus without complication ([place])    Hyperlipidemia    Hypertension    Psoriasis    Past Surgical History:  Procedure Laterality Date   BLEPHAROPLASTY Bilateral 12/30/2014   COLONOSCOPY WITH PROPOFOL N/A 05/01/2016   Procedure: COLONOSCOPY WITH PROPOFOL;  Surgeon: Robert Bellow, MD;  Location: [facility];  Service: Endoscopy;  Laterality: N/A;   KNEE ARTHROSCOPY Left 01/14/2019   Procedure: ARTHROSCOPY KNEE WITH DEBRIDEMENT AND PARTIAL MEDIAL MENISCECTOMY;  Surgeon: Corky Mull, MD;  Location: [facility] OR[facility];  Service: Orthopedics;  Laterality: Left;   KNEE ARTHROSCOPY WITH MEDIAL MENISECTOMY Right 11/27/2016   Procedure: KNEE ARTHROSCOPY WITH MEDIAL MENISECTOMY;  Surgeon: Corky Mull, MD;  Location: [place];  Service: Orthopedics;  Laterality: Right;   SINUS SURGERY WITH INSTATRAK     TOTAL KNEE ARTHROPLASTY Right 11/28/2022   Procedure: TOTAL KNEE ARTHROPLASTY;  Surgeon: Corky Mull, MD;  Location: [facility] OR[facility];  Service: Orthopedics;  Laterality: Right;   VASECTOMY     Family History  Problem Relation Age of  Onset   Colon cancer Mother 64   Lymphoma Father    Social History   Socioeconomic History   Marital status: Married    Spouse name: Not on file   Number of children: 1   Years of education: Not on file   Highest education level: High school graduate  Occupational History   Not on file  Tobacco Use   Smoking status: Former    Types: Cigarettes    Quit date: 12/30/1985    Years since quitting: 37.2   Smokeless tobacco:  Never  Vaping Use   Vaping Use: Never used  Substance and Sexual Activity   Alcohol use: Yes    Alcohol/week: 4.0 standard drinks of alcohol    Types: 4 Standard drinks or equivalent per week    Comment: social   Drug use: No   Sexual activity: Yes    Birth control/protection: None  Other Topics Concern   Not on file  Social History Narrative   Works part time as truck Surveyor, quantity Determinants of Radio broadcast assistant Strain: Low Risk  (03/19/2023)   Overall Financial Resource Strain (CARDIA)    Difficulty of Paying Living Expenses: Not hard at all  Food Insecurity: No Food Insecurity (03/19/2023)   Hunger Vital Sign    Worried About Running Out of Food in the Last Year: Never true    [place] in the Last Year: Never true  Transportation Needs: No Transportation Needs (03/19/2023)   PRAPARE - Hydrologist (Medical): No    Lack of Transportation (Non-Medical): No  Physical Activity: Sufficiently Active (03/19/2023)   Exercise Vital Sign    Days of Exercise per Week: 3 days    Minutes of Exercise per Session: 60 min  Stress: No Stress Concern Present (03/19/2023)   [place]    Feeling of Stress : Not at all  Social Connections: Moderately Isolated (03/19/2023)   Social Connection and Isolation Panel [NHANES]    Frequency of Communication with Friends and Family: More than three times a week    Frequency of Social Gatherings with Friends and Family: Twice a week    Attends Religious Services: Never    Marine scientist or Organizations: No    Attends Music therapist: Never    Marital Status: Married    Tobacco Counseling Counseling given: Not Answered   Clinical Intake:  Pre-visit preparation completed: Yes  Pain : No/denies pain     Nutritional Risks: None Diabetes: No  How often do you need to have someone help you when you read  instructions, pamphlets, or other written materials from your doctor or pharmacy?: 1 - Never  Diabetic?yes Nutrition Risk Assessment:  Has the patient had any N/V/D within the last 2 months?  No  Does the patient have any non-healing wounds?  No  Has the patient had any unintentional weight loss or weight gain?  No   Diabetes:  Is the patient diabetic?  Yes  If diabetic, was a CBG obtained today?  No  Did the patient bring in their glucometer from home?  No  How often do you monitor your CBG's? never.   Financial Strains and Diabetes Management:  Are you having any financial strains with the device, your supplies or your medication? No .  Does the patient want to be seen by Chronic Care Management for management of their diabetes?  No  Would the patient like to be referred to a Nutritionist or for Diabetic Management?  No   Diabetic Exams:  Diabetic Eye Exam: Completed 12/03/21. Overdue for diabetic eye exam. Pt has been advised about the importance in completing this exam.  Diabetic Foot Exam: Completed 03/14/23. Pt has been advised about the importance in completing this exam.   Interpreter Needed?: No  Information entered by :: Kirke Shaggy, LPN   Activities of Daily Living    03/19/2023   11:32 AM 11/28/2022    7:12 AM  In your present state of health, do you have any difficulty performing the following activities:  Hearing? 0   Vision? 0   Difficulty concentrating or making decisions? 0   Walking or climbing stairs? 0   Dressing or bathing? 0   Doing errands, shopping? 0 0  Preparing Food and eating ? N   Using the Toilet? N   In the past six months, have you accidently leaked urine? N   Do you have problems with loss of bowel control? N   Managing your Medications? N   Managing your Finances? N   Housekeeping or managing your Housekeeping? N     Patient Care Team: Glean Hess, MD as PCP - General (Internal Medicine) [facility]  (Optometry) Ree Edman, MD (Dermatology) Poggi, Marshall Cork, MD as Consulting Physician (Orthopedic Surgery) Billey Co, MD as Consulting Physician (Urology) Elijio Miles, RN as Registered Nurse  Indicate any recent Medical Services you may have received from other than [facility] providers in the past year (date may be approximate).     Assessment:   This is a routine wellness examination for [place].  Hearing/Vision screen Hearing Screening - Comments:: Has aids Vision Screening - Comments:: Readers- Dr.Woodard  Dietary issues and exercise activities discussed: Current Exercise Habits: Home exercise routine, Type of exercise: walking, Time (Minutes): 60, Frequency (Times/Week): 3, Weekly Exercise (Minutes/Week): 180, Intensity: Mild   Goals Addressed             This Visit's Progress    DIET - EAT MORE FRUITS AND VEGETABLES         Depression Screen    03/19/2023   11:30 AM 03/14/2023   11:09 AM 08/12/2022   11:27 AM 07/09/2022   10:50 AM 03/28/2022    8:22 AM 03/13/2022   10:47 AM 12/21/2021   10:24 AM  PHQ 2/9 Scores  PHQ - 2 Score 0 1 0 2 0 0 0  PHQ- 9 Score 0 3 2 6 4   0    Fall Risk    03/19/2023   11:32 AM 03/14/2023   11:09 AM 08/12/2022   11:28 AM 07/09/2022   10:49 AM 03/28/2022    8:22 AM  Fall Risk   Falls in the past year? 0 0 0 0 0  Number falls in past yr: 0 0 0 0 0  Injury with Fall? 0 0 0 0 0  Risk for fall due to : No Fall Risks No Fall Risks No Fall Risks No Fall Risks No Fall Risks  Follow up Falls prevention discussed;Falls evaluation completed Falls evaluation completed Falls evaluation completed Falls evaluation completed Falls evaluation completed    FALL RISK PREVENTION PERTAINING TO THE HOME:  Any stairs in or around the home? Yes  If so, are there any without handrails? No  Home free of loose throw rugs in walkways, pet beds, electrical cords, etc? Yes  Adequate lighting in your home  to reduce risk of falls? Yes   ASSISTIVE  DEVICES UTILIZED TO PREVENT FALLS:  Life alert? No  Use of a cane, walker or w/c? No  Grab bars in the bathroom? No  Shower chair or bench in shower? No  Elevated toilet seat or a handicapped toilet? No   Cognitive Function:        03/19/2023   11:35 AM 03/12/2021   11:07 AM  6CIT Screen  What Year? 0 points 0 points  What month? 0 points 0 points  What time? 0 points 0 points  Count back from 20 0 points 0 points  Months in reverse 4 points 4 points  Repeat phrase 0 points 0 points  Total Score 4 points 4 points    Immunizations Immunization History  Administered Date(s) Administered   COVID-19, mRNA, vaccine(Comirnaty)12 years and older 03/14/2023   Fluad Quad(high Dose 65+) 09/25/2020, 03/14/2023   Influenza,inj,Quad PF,6+ Mos 12/10/2015, 10/15/2018, 10/09/2019   Influenza-Unspecified 09/27/2021   Moderna Sars-Covid-2 Vaccination 11/29/2020, 04/19/2021   PFIZER(Purple Top)SARS-COV-2 Vaccination 02/13/2020, 03/07/2020   Pneumococcal Conjugate-13 10/15/2018   Pneumococcal Polysaccharide-23 09/25/2020   Tdap 05/08/2011   Zoster, Live 05/08/2011    TDAP status: Due, Education has been provided regarding the importance of this vaccine. Advised may receive this vaccine at local pharmacy or Health Dept. Aware to provide a copy of the vaccination record if obtained from local pharmacy or Health Dept. Verbalized acceptance and understanding.  Flu Vaccine status: Up to date  Pneumococcal vaccine status: Up to date  Covid-19 vaccine status: Completed vaccines  Qualifies for Shingles Vaccine? Yes   Zostavax completed Yes   Shingrix Completed?: No.    Education has been provided regarding the importance of this vaccine. Patient has been advised to call insurance company to determine out of pocket expense if they have not yet received this vaccine. Advised may also receive vaccine at local pharmacy or Health Dept. Verbalized acceptance and understanding.  Screening  Tests Health Maintenance  Topic Date Due   Zoster Vaccines- Shingrix (1 of 2) Never done   DTaP/Tdap/Td (2 - Td or Tdap) 05/07/2021   OPHTHALMOLOGY EXAM  12/03/2022   HEMOGLOBIN A1C  09/14/2023   Diabetic kidney evaluation - eGFR measurement  03/13/2024   Diabetic kidney evaluation - Urine ACR  03/13/2024   FOOT EXAM  03/13/2024   Medicare Annual Wellness (AWV)  03/18/2024   COLONOSCOPY (Pts 45-95yrs Insurance coverage will need to be confirmed)  05/01/2026   Pneumonia Vaccine 58+ Years old  Completed   INFLUENZA VACCINE  Completed   COVID-19 Vaccine  Completed   Hepatitis C Screening  Completed   HPV VACCINES  Aged Out    Health Maintenance  Health Maintenance Due  Topic Date Due   Zoster Vaccines- Shingrix (1 of 2) Never done   DTaP/Tdap/Td (2 - Td or Tdap) 05/07/2021   OPHTHALMOLOGY EXAM  12/03/2022    Colorectal cancer screening: Type of screening: Colonoscopy. Completed 05/01/16. Repeat every 10 years  Lung Cancer Screening: (Low Dose CT Chest recommended if Age 21-80 years, 30 pack-year currently smoking OR have quit w/in 15years.) does not qualify.    Additional Screening:  Hepatitis C Screening: does qualify; Completed 03/26/21  Vision Screening: Recommended annual ophthalmology exams for early detection of glaucoma and other disorders of the eye. Is the patient up to date with their annual eye exam?  Yes  Who is the provider or what is the name of the office in which the patient attends annual  eye exams? Dr.Woodard If pt is not established with a provider, would they like to be referred to a provider to establish care? No .   Dental Screening: Recommended annual dental exams for proper oral hygiene  Community Resource Referral / Chronic Care Management: CRR required this visit?  No   CCM required this visit?  No      Plan:     I have personally reviewed and noted the following in the patient's chart:   Medical and social history Use of alcohol, tobacco  or illicit drugs  Current medications and supplements including opioid prescriptions. Patient is not currently taking opioid prescriptions. Functional ability and status Nutritional status Physical activity Advanced directives List of other physicians Hospitalizations, surgeries, and ER visits in previous 12 months Vitals Screenings to include cognitive, depression, and falls Referrals and appointments  In addition, I have reviewed and discussed with patient certain preventive protocols, quality metrics, and best practice recommendations. A written personalized care plan for preventive services as well as general preventive health recommendations were provided to patient.     Dionisio David, LPN   [id number]   Nurse Notes: none

## 2023-03-19 NOTE — Telephone Encounter (Signed)
Requested Prescriptions   Signed Prescriptions Disp Refills   lisinopril (ZESTRIL) 40 MG tablet 30 tablet 0    Sig: TAKE 1 TABLET BY MOUTH EVERY DAY    Authorizing Provider: Kate Sable    Ordering User: NEWCOMER MCCLAIN, Bennie Chirico L

## 2023-03-21 ENCOUNTER — Ambulatory Visit: Payer: Medicare Other | Attending: Cardiology | Admitting: Cardiology

## 2023-03-21 ENCOUNTER — Encounter: Payer: Self-pay | Admitting: Cardiology

## 2023-03-21 VITALS — BP 140/90 | HR 79 | Ht 75.0 in | Wt 289.5 lb

## 2023-03-21 DIAGNOSIS — Z6836 Body mass index (BMI) 36.0-36.9, adult: Secondary | ICD-10-CM | POA: Diagnosis not present

## 2023-03-21 DIAGNOSIS — I10 Essential (primary) hypertension: Secondary | ICD-10-CM | POA: Diagnosis not present

## 2023-03-21 NOTE — Progress Notes (Signed)
Cardiology Office Note:    Date:  03/21/2023   ID:  Benjamin Medina, DOB Nov 25, 1951, MRN [id number]  PCP:  Glean Hess, MD   [place] Providers Cardiologist:  None     Referring MD: Glean Hess, MD   Chief Complaint  Patient presents with   3-4 month follow up     "Doing well." Medications reviewed by the patient verbally.     History of Present Illness:    Benjamin Medina is a 72 y.o. male with a hx of hypertension, hyperlipidemia, prediabetes who presents for follow-up.  Previously seen due to leg edema.  Echocardiogram showed normal systolic function, edema is currently resolved.  Patient started on Ozempic due to obesity and being a prediabetic.  Has been losing weight, denies any medication side effects.  Blood pressures are well-controlled at home, has not taken BP medication this morning.  Systolics usually in the [id number].  Prior notes/studies Echo 10/17/2022 EF 60 to 65%, impaired relaxation.  Past Medical History:  Diagnosis Date   Arthritis    Auditory impairment    BPH with urinary obstruction    Contracture of hand    PALMAR FASCIA   Diabetes mellitus without complication ([place])    Hyperlipidemia    Hypertension    Psoriasis     Past Surgical History:  Procedure Laterality Date   BLEPHAROPLASTY Bilateral 12/30/2014   COLONOSCOPY WITH PROPOFOL N/A 05/01/2016   Procedure: COLONOSCOPY WITH PROPOFOL;  Surgeon: Robert Bellow, MD;  Location: [facility];  Service: Endoscopy;  Laterality: N/A;   KNEE ARTHROSCOPY Left 01/14/2019   Procedure: ARTHROSCOPY KNEE WITH DEBRIDEMENT AND PARTIAL MEDIAL MENISCECTOMY;  Surgeon: Corky Mull, MD;  Location: [facility];  Service: Orthopedics;  Laterality: Left;   KNEE ARTHROSCOPY WITH MEDIAL MENISECTOMY Right 11/27/2016   Procedure: KNEE ARTHROSCOPY WITH MEDIAL MENISECTOMY;  Surgeon: Corky Mull, MD;  Location: [place];  Service: Orthopedics;  Laterality: Right;   SINUS SURGERY WITH  INSTATRAK     TOTAL KNEE ARTHROPLASTY Right 11/28/2022   Procedure: TOTAL KNEE ARTHROPLASTY;  Surgeon: Corky Mull, MD;  Location: [facility];  Service: Orthopedics;  Laterality: Right;   VASECTOMY      Current Medications: Current Meds  Medication Sig   atorvastatin (LIPITOR) 10 MG tablet TAKE 1 TABLET(10 MG) BY MOUTH DAILY   clobetasol (TEMOVATE) 0.05 % GEL Apply 1 application topically 2 (two) times daily.   hydrochlorothiazide (MICROZIDE) 12.5 MG capsule Take 1 capsule (12.5 mg total) by mouth daily.   lisinopril (ZESTRIL) 40 MG tablet TAKE 1 TABLET BY MOUTH EVERY DAY   Semaglutide, 2 MG/DOSE, (OZEMPIC, 2 MG/DOSE,) 8 MG/3ML SOPN Inject 1 Pen into the skin once a week.   triamcinolone (KENALOG) 0.025 % cream Apply topically 2 (two) times daily.     Allergies:   Patient has no known allergies.   Social History   Socioeconomic History   Marital status: Married    Spouse name: Not on file   Number of children: 1   Years of education: Not on file   Highest education level: High school graduate  Occupational History   Not on file  Tobacco Use   Smoking status: Former    Types: Cigarettes    Quit date: 12/30/1985    Years since quitting: 37.2   Smokeless tobacco: Never  Vaping Use   Vaping Use: Never used  Substance and Sexual Activity   Alcohol use: Yes    Alcohol/week: 4.0 standard  drinks of alcohol    Types: 4 Standard drinks or equivalent per week    Comment: social   Drug use: No   Sexual activity: Yes    Birth control/protection: None  Other Topics Concern   Not on file  Social History Narrative   Works part time as truck Surveyor, quantity Determinants of Radio broadcast assistant Strain: Low Risk  (03/19/2023)   Overall Financial Resource Strain (CARDIA)    Difficulty of Paying Living Expenses: Not hard at all  Food Insecurity: No Food Insecurity (03/19/2023)   Hunger Vital Sign    Worried About Running Out of Food in the Last Year: Never true    [place] in the Last Year: Never true  Transportation Needs: No Transportation Needs (03/19/2023)   PRAPARE - Hydrologist (Medical): No    Lack of Transportation (Non-Medical): No  Physical Activity: Sufficiently Active (03/19/2023)   Exercise Vital Sign    Days of Exercise per Week: 3 days    Minutes of Exercise per Session: 60 min  Stress: No Stress Concern Present (03/19/2023)   [place]    Feeling of Stress : Not at all  Social Connections: Moderately Isolated (03/19/2023)   Social Connection and Isolation Panel [NHANES]    Frequency of Communication with Friends and Family: More than three times a week    Frequency of Social Gatherings with Friends and Family: Twice a week    Attends Religious Services: Never    Marine scientist or Organizations: No    Attends Music therapist: Never    Marital Status: Married     Family History: The patient's family history includes Colon cancer (age of onset: 47) in his mother; Lymphoma in his father.  ROS:   Please see the history of present illness.     All other systems reviewed and are negative.  EKGs/Labs/Other Studies Reviewed:    The following studies were reviewed today:   EKG:  EKG is ordered today.  EKG shows normal sinus rhythm, normal ECG.  Recent Labs: 07/09/2022: TSH 1.040 03/14/2023: ALT 32; BUN 14; Creatinine, Ser 1.06; Hemoglobin 14.5; Platelets 356; Potassium 4.9; Sodium 142  Recent Lipid Panel    Component Value Date/Time   CHOL 150 03/14/2023 1139   TRIG 202 (H) 03/14/2023 1139   HDL 39 (L) 03/14/2023 1139   CHOLHDL 3.8 03/14/2023 1139   LDLCALC 77 03/14/2023 1139     Risk Assessment/Calculations:         Physical Exam:    VS:  BP (!) 140/90 (BP Location: Left Arm, Patient Position: Sitting, Cuff Size: Large)   Pulse 79   Ht 6\' 3"  (1.905 m)   Wt 289 lb 8 oz (131.3 kg)   SpO2 95%   BMI 36.19  kg/m     Wt Readings from Last 3 Encounters:  03/21/23 289 lb 8 oz (131.3 kg)  03/19/23 289 lb (131.1 kg)  03/14/23 289 lb (131.1 kg)     GEN:  Well nourished, well developed in no acute distress HEENT: Normal NECK: No JVD; No carotid bruits CARDIAC: RRR, no murmurs, rubs, gallops RESPIRATORY:  Clear to auscultation without rales, wheezing or rhonchi  ABDOMEN: Soft, non-tender, non-distended MUSCULOSKELETAL:  No edema; left ankle varicose veins noted SKIN: Warm and dry NEUROLOGIC:  Alert and oriented x 3 PSYCHIATRIC:  Normal affect   ASSESSMENT:  1. Primary hypertension   2. BMI 36.0-36.9,adult    PLAN:    In order of problems listed above:  Hypertension, BP is elevated, usually controlled.  Continue lisinopril 40 mg daily, continue HCTZ 12.5 mg daily.  Obesity, has lost 20 pounds since initiated Ozempic about 5 months ago.  Continue low calorie diet, weight loss, Ozempic.  Follow-up as needed.     Medication Adjustments/Labs and Tests Ordered: Current medicines are reviewed at length with the patient today.  Concerns regarding medicines are outlined above.  Orders Placed This Encounter  Procedures   EKG 12-Lead   No orders of the defined types were placed in this encounter.   Patient Instructions  Medication Instructions:   Your physician recommends that you continue on your current medications as directed. Please refer to the Current Medication list given to you today.   *If you need a refill on your cardiac medications before your next appointment, please call your pharmacy*   Lab Work:  None Ordered  If you have labs (blood work) drawn today and your tests are completely normal, you will receive your results only by: [place] (if you have MyChart) OR A paper copy in the mail If you have any lab test that is abnormal or we need to change your treatment, we will call you to review the results.   Testing/Procedures:  None  ordered   Follow-Up: At [facility], you and your health needs are our priority.  As part of our continuing mission to provide you with exceptional heart care, we have created designated Provider Care Teams.  These Care Teams include your primary Cardiologist (physician) and Advanced Practice Providers (APPs -  Physician Assistants and Nurse Practitioners) who all work together to provide you with the care you need, when you need it.  We recommend signing up for the patient portal called "MyChart".  Sign up information is provided on this After Visit Summary.  MyChart is used to connect with patients for Virtual Visits (Telemedicine).  Patients are able to view lab/test results, encounter notes, upcoming appointments, etc.  Non-urgent messages can be sent to your provider as well.   To learn more about what you can do with MyChart, go to [email or web address].    Your next appointment:    AS NEEDED    Signed, Kate Sable, MD  03/21/2023 11:50 AM    [place]

## 2023-03-21 NOTE — Patient Instructions (Signed)
Medication Instructions:   Your physician recommends that you continue on your current medications as directed. Please refer to the Current Medication list given to you today.  *If you need a refill on your cardiac medications before your next appointment, please call your pharmacy*   Lab Work:  None Ordered  If you have labs (blood work) drawn today and your tests are completely normal, you will receive your results only by: MyChart Message (if you have MyChart) OR A paper copy in the mail If you have any lab test that is abnormal or we need to change your treatment, we will call you to review the results.   Testing/Procedures:  None ordered   Follow-Up: At [facility], you and your health needs are our priority.  As part of our continuing mission to provide you with exceptional heart care, we have created designated Provider Care Teams.  These Care Teams include your primary Cardiologist (physician) and Advanced Practice Providers (APPs -  Physician Assistants and Nurse Practitioners) who all work together to provide you with the care you need, when you need it.  We recommend signing up for the patient portal called "MyChart".  Sign up information is provided on this After Visit Summary.  MyChart is used to connect with patients for Virtual Visits (Telemedicine).  Patients are able to view lab/test results, encounter notes, upcoming appointments, etc.  Non-urgent messages can be sent to your provider as well.   To learn more about what you can do with MyChart, go to [email or web address].    Your next appointment:  AS NEEDED   ' 

## 2023-03-27 ENCOUNTER — Other Ambulatory Visit: Payer: Self-pay

## 2023-03-27 DIAGNOSIS — I10 Essential (primary) hypertension: Secondary | ICD-10-CM

## 2023-03-27 MED ORDER — LISINOPRIL 40 MG PO TABS: ORAL_TABLET | ORAL | 11 refills | Status: DC

## 2023-04-18 ENCOUNTER — Ambulatory Visit: Payer: Medicare Other | Admitting: Cardiology

## 2023-05-05 ENCOUNTER — Telehealth: Payer: Self-pay | Admitting: Cardiology

## 2023-05-05 DIAGNOSIS — I10 Essential (primary) hypertension: Secondary | ICD-10-CM

## 2023-05-05 MED ORDER — LISINOPRIL 40 MG PO TABS: ORAL_TABLET | ORAL | 2 refills | Status: DC

## 2023-05-05 NOTE — Telephone Encounter (Signed)
Requested Prescriptions   Signed Prescriptions Disp Refills   lisinopril (ZESTRIL) 40 MG tablet 90 tablet 2    Sig: TAKE 1 TABLET BY MOUTH EVERY DAY    Authorizing Provider: Debbe Odea    Ordering User: NEWCOMER MCCLAIN, Loeta Herst L

## 2023-05-05 NOTE — Telephone Encounter (Signed)
*  STAT* If patient is at the pharmacy, call can be transferred to refill team.   1. Which medications need to be refilled? (please list name of each medication and dose if known) lisinopril (ZESTRIL) 40 MG tablet  2. Which pharmacy/location (including street and city if local pharmacy) is medication to be sent to?[id number] - [place], [place] - [address] AT [facility] OF [address]   3. Do they need a 30 day or 90 day supply? 90 Day Supply

## 2023-06-02 DIAGNOSIS — M1711 Unilateral primary osteoarthritis, right knee: Secondary | ICD-10-CM | POA: Diagnosis not present

## 2023-06-02 DIAGNOSIS — Z96651 Presence of right artificial knee joint: Secondary | ICD-10-CM | POA: Diagnosis not present

## 2023-06-18 ENCOUNTER — Other Ambulatory Visit: Payer: Self-pay | Admitting: Internal Medicine

## 2023-06-18 ENCOUNTER — Other Ambulatory Visit: Payer: Self-pay | Admitting: Pharmacist

## 2023-06-18 DIAGNOSIS — E1169 Type 2 diabetes mellitus with other specified complication: Secondary | ICD-10-CM

## 2023-06-18 DIAGNOSIS — I10 Essential (primary) hypertension: Secondary | ICD-10-CM

## 2023-06-18 MED ORDER — ATORVASTATIN CALCIUM 10 MG PO TABS: ORAL_TABLET | ORAL | 1 refills | Status: DC

## 2023-06-18 MED ORDER — HYDROCHLOROTHIAZIDE 12.5 MG PO CAPS: 12.5000 mg | ORAL_CAPSULE | Freq: Every day | ORAL | 1 refills | Status: DC

## 2023-06-18 NOTE — Progress Notes (Signed)
   06/18/2023  Patient ID: Benjamin Medina, male   DOB: [birth year], 72 y.o.   MRN: [id number]  Pharmacy Quality Measure Review  This patient is appearing on a report for being at risk of failing the adherence measure for cholesterol (statin) and hypertension (ACEi/ARB) medications this calendar year.   Medication: atorvastatin 10 mg Last fill date: 02/07/2023 for 90 day supply  Medication: HCTZ 12.5 mg Last fill date: 09/10/2023 for 90 day supply  Medication: lisinopril 40 mg  Last fill date: 05/27/2023 for 30 day supply   This patient is appearing on a report for being at risk of failing the Controlling Blood Pressure measure this calendar year.   Last documented BP 140/90 on 03/21/2023   BP Readings from Last 3 Encounters:  03/21/23 (!) 140/90  03/14/23 112/78  11/28/22 133/69   Pulse Readings from Last 3 Encounters:  03/21/23 79  03/14/23 80  11/28/22 84    Today outreach to patient by telephone  Discuss with patient importance of medication adherence and discuss noted gaps in refill history (HCTZ and atorvastatin.  Patient denies missed doses of medications. States will call his pharmacy if needs a refill. Note latest prescription for atorvastatin is out of refills and HCTZ prescription has only quantity of #30 remaining.  Note patient has upcoming appointment with his PCP on 07/15/2023   Plan  Will send message to collaborate with patient's PCP  Estelle Grumbles, PharmD, [facility] [phone]

## 2023-07-07 ENCOUNTER — Telehealth: Payer: Self-pay | Admitting: Cardiology

## 2023-07-07 NOTE — Telephone Encounter (Signed)
Pt c/o medication issue:  1. Name of Medication: Semaglutide, 2 MG/DOSE, (OZEMPIC, 2 MG/DOSE,) 8 MG/3ML SOPN   2. How are you currently taking this medication (dosage and times per day)? Injects once a week  3. Are you having a reaction (difficulty breathing--STAT)? no  4. What is your medication issue? Patient states the medication was 47 dollars and now it is 240 dollars. He says he contacted his insurance and they said there is a coverage gap. He would like to know if there is another medication cheaper he can take.

## 2023-07-07 NOTE — Telephone Encounter (Signed)
Spoke with patient and advised that we do have patient assistance application that we can provide. He was interested in getting this done. He will be in Thursday to sign documentation. He had no further questions at this time.

## 2023-07-10 NOTE — Telephone Encounter (Signed)
Patient stopped in to sign PAF, please call when ready.

## 2023-07-15 ENCOUNTER — Ambulatory Visit (INDEPENDENT_AMBULATORY_CARE_PROVIDER_SITE_OTHER): Payer: Medicare Other | Admitting: Internal Medicine

## 2023-07-15 ENCOUNTER — Encounter: Payer: Self-pay | Admitting: Internal Medicine

## 2023-07-15 VITALS — BP 128/62 | HR 80 | Ht 75.0 in | Wt 290.0 lb

## 2023-07-15 DIAGNOSIS — Z7985 Long-term (current) use of injectable non-insulin antidiabetic drugs: Secondary | ICD-10-CM | POA: Diagnosis not present

## 2023-07-15 DIAGNOSIS — I10 Essential (primary) hypertension: Secondary | ICD-10-CM

## 2023-07-15 DIAGNOSIS — E118 Type 2 diabetes mellitus with unspecified complications: Secondary | ICD-10-CM | POA: Diagnosis not present

## 2023-07-15 LAB — POCT GLYCOSYLATED HEMOGLOBIN (HGB A1C): Hemoglobin A1C: 5.6 % (ref 4.0–5.6)

## 2023-07-15 NOTE — Assessment & Plan Note (Addendum)
Blood sugars stable without hypoglycemic symptoms or events. Current regimen is Ozempic prescribed by Cardiology - he stopped 2 weeks ago due to cost so Cards is going to try to help with PA Changes made last visit are none. Lab Results  Component Value Date   HGBA1C 6.3 (H) 03/14/2023  A1C today is 5.6.

## 2023-07-15 NOTE — Progress Notes (Signed)
Date:  07/15/2023   Name:  Benjamin Medina   DOB:  [birth year]   MRN:  [id number]   Chief Complaint: Hypertension and Diabetes  Diabetes He presents for his follow-up diabetic visit. He has type 2 diabetes mellitus. His disease course has been stable. Pertinent negatives for hypoglycemia include no headaches or tremors. Pertinent negatives for diabetes include no chest pain, no fatigue, no polydipsia and no polyuria. Symptoms are stable.  Hypertension This is a chronic problem. The problem is controlled. Pertinent negatives include no chest pain, headaches, palpitations or shortness of breath. Past treatments include ACE inhibitors and diuretics. The current treatment provides significant improvement.    Lab Results  Component Value Date   NA 142 03/14/2023   K 4.9 03/14/2023   CO2 20 03/14/2023   GLUCOSE 94 03/14/2023   BUN 14 03/14/2023   CREATININE 1.06 03/14/2023   CALCIUM 9.7 03/14/2023   EGFR 75 03/14/2023   GFRNONAA >60 11/19/2022   Lab Results  Component Value Date   CHOL 150 03/14/2023   HDL 39 (L) 03/14/2023   LDLCALC 77 03/14/2023   TRIG 202 (H) 03/14/2023   CHOLHDL 3.8 03/14/2023   Lab Results  Component Value Date   TSH 1.040 07/09/2022   Lab Results  Component Value Date   HGBA1C 5.6 07/15/2023   Lab Results  Component Value Date   WBC 8.8 03/14/2023   HGB 14.5 03/14/2023   HCT 43.9 03/14/2023   MCV 93 03/14/2023   PLT 356 03/14/2023   Lab Results  Component Value Date   ALT 32 03/14/2023   AST 17 03/14/2023   ALKPHOS 97 03/14/2023   BILITOT <0.2 03/14/2023   No results found for: "25OHVITD2", "25OHVITD3", "VD25OH"   Review of Systems  Constitutional:  Negative for appetite change, fatigue and unexpected weight change.  Eyes:  Negative for visual disturbance.  Respiratory:  Negative for cough, shortness of breath and wheezing.   Cardiovascular:  Negative for chest pain, palpitations and leg swelling.  Gastrointestinal:  Negative for  abdominal pain and blood in stool.  Endocrine: Negative for polydipsia and polyuria.  Genitourinary:  Negative for dysuria and hematuria.  Musculoskeletal:  Positive for arthralgias (transient foot pain has resolved).  Skin:  Negative for color change and rash.  Neurological:  Negative for tremors, numbness and headaches.  Psychiatric/Behavioral:  Negative for dysphoric mood.     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   Generalized psoriasis 11/04/2015   Periodic limb movement 11/04/2015    No Known Allergies  Past Surgical History:  Procedure Laterality Date   BLEPHAROPLASTY Bilateral 12/30/2014   COLONOSCOPY WITH PROPOFOL N/A 05/01/2016   Procedure: COLONOSCOPY WITH PROPOFOL;  Surgeon: Earline Mayotte, MD;  Location: [facility];  Service: Endoscopy;  Laterality: N/A;   KNEE ARTHROSCOPY Left 01/14/2019   Procedure: ARTHROSCOPY KNEE WITH DEBRIDEMENT AND PARTIAL MEDIAL MENISCECTOMY;  Surgeon: Christena Flake, MD;  Location: [facility] ORS;  Service: Orthopedics;  Laterality: Left;   KNEE ARTHROSCOPY WITH MEDIAL MENISECTOMY Right 11/27/2016   Procedure: KNEE ARTHROSCOPY WITH MEDIAL MENISECTOMY;  Surgeon: Christena Flake, MD;  Location[facility];  Service: Orthopedics;  Laterality: Right;   SINUS SURGERY WITH INSTATRAK     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  Current Meds  Medication Sig   atorvastatin (LIPITOR) 10 MG tablet TAKE 1 TABLET(10 MG) BY MOUTH DAILY   clobetasol (TEMOVATE) 0.05 % GEL Apply 1 application topically 2 (two) times daily.   hydrochlorothiazide (MICROZIDE) 12.5 MG capsule Take 1 capsule (12.5 mg total) by mouth daily.   lisinopril (ZESTRIL) 40 MG tablet TAKE 1 TABLET BY MOUTH EVERY DAY   Semaglutide, 2 MG/DOSE, (OZEMPIC, 2 MG/DOSE,) 8 MG/3ML SOPN Inject 1 Pen into the skin once a week.   triamcinolone (KENALOG) 0.025 % cream Apply topically 2 (two) times daily.       07/15/2023   10:08 AM 03/14/2023   11:09 AM 08/12/2022   11:28 AM 07/09/2022   10:50 AM  GAD 7 : Generalized Anxiety Score  Nervous, Anxious, on Edge 0 0 0 1  Control/stop worrying 0 0 0 1  Worry too much - different things 0 0 0 0  Trouble relaxing 0 0 0 0  Restless 0 0 0 0  Easily annoyed or irritable 0 1 0 2  Afraid - awful might happen 0 0 0 1  Total GAD 7 Score 0 1 0 5  Anxiety Difficulty Not difficult at all Not difficult at all Not difficult at all Not difficult at all       07/15/2023   10:08 AM 03/19/2023   11:30 AM 03/14/2023   11:09 AM  Depression screen PHQ 2/9  Decreased Interest 0 0 1  Down, Depressed, Hopeless 0 0 0  PHQ - 2 Score 0 0 1  Altered sleeping 0 0 1  Tired, decreased energy 0 0 1  Change in appetite 0 0 0  Feeling bad or failure about yourself  0 0 0  Trouble concentrating 0 0 0  Moving slowly or fidgety/restless 0 0 0  Suicidal thoughts 0 0 0  PHQ-9 Score 0 0 3  Difficult doing work/chores Not difficult at all Not difficult at all Not difficult at  all    BP Readings from Last 3 Encounters:  07/15/23 128/62  03/21/23 (!) 140/90  03/14/23 112/78    Physical Exam Vitals and nursing note reviewed.  Constitutional:      General: He is not in acute distress.    Appearance: He is well-developed.  HENT:     Head: Normocephalic and atraumatic.  Cardiovascular:     Rate and Rhythm: Normal rate and regular rhythm.     Heart sounds: No murmur heard. Pulmonary:     Effort: Pulmonary effort is normal. No respiratory distress.  Musculoskeletal:     Cervical back: Normal range of motion.  Right lower leg: No edema.     Left lower leg: No edema.  Lymphadenopathy:     Cervical: No cervical adenopathy.  Skin:    General: Skin is warm and dry.     Capillary Refill: Capillary refill takes less than 2 seconds.     Findings: No rash.  Neurological:     Mental Status: He is alert and oriented to person, place, and time.  Psychiatric:        Mood and Affect: Mood normal.        Behavior: Behavior normal.     Wt Readings from Last 3 Encounters:  07/15/23 290 lb (131.5 kg)  03/21/23 289 lb 8 oz (131.3 kg)  03/19/23 289 lb (131.1 kg)    BP 128/62   Pulse 80   Ht 6\' 3"  (1.905 m)   Wt 290 lb (131.5 kg)   SpO2 97%   BMI 36.25 kg/m   Assessment and Plan:  Problem List Items Addressed This Visit     Essential hypertension (Chronic)    Normal exam with stable BP on lisinopril and hctz. No concerns or side effects to current medication. No change in regimen; continue low sodium diet.       DM type 2, controlled, with complication (HCC) - Primary (Chronic)    Blood sugars stable without hypoglycemic symptoms or events. Current regimen is Ozempic prescribed by Cardiology - he stopped 2 weeks ago due to cost so Cards is going to try to help with PA Changes made last visit are none. Lab Results  Component Value Date   HGBA1C 6.3 (H) 03/14/2023  A1C today is 5.6.       Relevant Orders   POCT glycosylated hemoglobin (Hb  A1C) (Completed)   Other Visit Diagnoses     Long-term current use of injectable noninsulin antidiabetic medication           Return in about 4 months (around 11/15/2023) for DM, HTN.    Reubin Milan, MD [facility] [place]

## 2023-07-15 NOTE — Assessment & Plan Note (Signed)
Normal exam with stable BP on lisinopril and hctz. No concerns or side effects to current medication. No change in regimen; continue low sodium diet.

## 2023-07-15 NOTE — Patient Instructions (Signed)
Schedule Eye exam 

## 2023-07-21 NOTE — Telephone Encounter (Signed)
Spoke with patient and he reports coming in to office to sign documentation but they were unable to find it for him to sign. Apologies given and advised that I would get this information ready and will call him back once it is has been done. He verbalized understanding with no further questions at this time.   Found the application that he was supposed to sign in my cart. Will work on getting documentation for the application and will then call him back.

## 2023-07-21 NOTE — Telephone Encounter (Signed)
Spoke with patient and reviewed that application has been completed and ready for him to sign. He states that he will be by tomorrow to get that done. He was appreciative for the call back with no further questions at this time.

## 2023-07-22 MED ORDER — OZEMPIC (2 MG/DOSE) 8 MG/3ML ~~LOC~~ SOPN: 1.0000 | PEN_INJECTOR | SUBCUTANEOUS | 11 refills | Status: DC

## 2023-07-22 NOTE — Telephone Encounter (Signed)
MD forms given to [place] to have Dr. Azucena Cecil sign. Other portion has been placed in my patient assistance application file.

## 2023-07-22 NOTE — Telephone Encounter (Signed)
Patient came by office and signed application. Will complete MD portion and have him sign on his return.

## 2023-08-05 ENCOUNTER — Telehealth: Payer: Self-pay | Admitting: Internal Medicine

## 2023-08-05 NOTE — Telephone Encounter (Signed)
Copied from CRM [id number]. Topic: General - Other >> Aug 05, 2023  3:44 PM Phill Myron wrote: Antonietta Jewel with [employer] will be sending a fax regarding:atorvastatin (LIPITOR) 10 MG tablet  and a possible reaction  patient is having from this medication. Patient is have muscle soreness.  Antonietta Jewel will be faxing a document over please call patient and discuss. Antonietta Jewel will refax the document today this will be the 2nd time.. PLease return the fax after you have spoken with patient. Case ID #[id number]

## 2023-08-05 NOTE — Telephone Encounter (Signed)
Called and informed patient.    He verbalized understanding.

## 2023-09-02 ENCOUNTER — Other Ambulatory Visit (HOSPITAL_COMMUNITY): Payer: Self-pay

## 2023-09-02 ENCOUNTER — Telehealth: Payer: Self-pay

## 2023-09-02 NOTE — Telephone Encounter (Signed)
Pharmacy Patient Advocate Encounter  Insurance verification completed.    The patient is insured through [facility]    Ran test claim for [facility]. Currently a quantity of is a 28 day supply and the co-pay is 228.53 . (NO P/A IS REQ'D)   This test claim was processed through [facility]- copay amounts may vary at other pharmacies due to pharmacy/plan contracts, or as the patient moves through the different stages of their insurance plan.

## 2023-09-02 NOTE — Telephone Encounter (Addendum)
Spoke with patient to review he was approved for assistance with his medication. Approval letter states they are sending a 4 month supply here to our office for the patient to pick up. They will also send [place] a letter for refills as well. Will update [place] CMA to watch out for samples and any refill requests. Instructed patient to give [place] a call back by the end of next week if he has not heard from anyone here in the office. He verbalized understanding with no further questions at this time.   Approval letter attached to application.

## 2023-09-12 ENCOUNTER — Telehealth: Payer: Self-pay | Admitting: Cardiology

## 2023-09-12 NOTE — Telephone Encounter (Signed)
Received patient Ozempic from patient assistance.   Called patient. No answer. LMOV to call back.   OZEMPIC Semaglutide, 2 MG/DOSE, (OZEMPIC, 2 MG/DOSE,) 8 MG/3ML SOPN 4 boxes [id number] 10/29/2025  These will be placed in the sample room in the refrigerator

## 2023-09-12 NOTE — Telephone Encounter (Signed)
Patient returned called.   Made patient aware his patient assistance ozempic has arrived to the office.   Made him aware to come to the front desk for pick up.

## 2023-09-12 NOTE — Telephone Encounter (Signed)
Pt c/o medication issue:  1. Name of Medication:  Ozempic  2. How are you currently taking this medication (dosage and times per day)?   3. Are you having a reaction (difficulty breathing--STAT)?   4. What is your medication issue?  See previous encounter.  Danielle with [employer] Patient Assistance is following up stating that the medication will be delivered to the office today.

## 2023-09-22 ENCOUNTER — Telehealth: Payer: Self-pay | Admitting: Cardiology

## 2023-09-22 NOTE — Telephone Encounter (Signed)
Pt c/o medication issue:  1. Name of Medication: Semaglutide, 2 MG/DOSE, (OZEMPIC, 2 MG/DOSE,) 8 MG/3ML SOPN   2. How are you currently taking this medication (dosage and times per day)? As written   3. Are you having a reaction (difficulty breathing--STAT)? No   4. What is your medication issue? Pt states this dosage made him sick and wants to know if he should decrease the dosage

## 2023-09-23 MED ORDER — SEMAGLUTIDE (1 MG/DOSE) 4 MG/3ML ~~LOC~~ SOPN: 1.0000 mg | PEN_INJECTOR | SUBCUTANEOUS | 12 refills | Status: DC

## 2023-09-23 NOTE — Telephone Encounter (Signed)
Spoke with patient and reviewed that provider recommended to decrease dose back to 1 mg weekly. Patient verbalized understanding, form completed and will fax to company.     Debbe Odea, MD  You hours ago (10:29 AM)    Decrease to prior/last tolerated dose.

## 2023-09-23 NOTE — Telephone Encounter (Signed)
If he is not tolerating 2mg  dose, would decrease to 1mg  weekly dose and see if he tolerates better. If not, can continue cutting back dose. It looks like he gets his med from pt assistance so updated rx will need to be sent in on [employer] application.  [email or web address]

## 2023-09-23 NOTE — Telephone Encounter (Signed)
Left voicemail message to call back.  

## 2023-11-20 ENCOUNTER — Encounter: Payer: Self-pay | Admitting: Internal Medicine

## 2023-11-20 ENCOUNTER — Other Ambulatory Visit: Payer: Self-pay | Admitting: Internal Medicine

## 2023-11-20 ENCOUNTER — Ambulatory Visit (INDEPENDENT_AMBULATORY_CARE_PROVIDER_SITE_OTHER): Payer: Medicare Other | Admitting: Internal Medicine

## 2023-11-20 VITALS — BP 128/72 | HR 82 | Ht 75.0 in | Wt 298.0 lb

## 2023-11-20 DIAGNOSIS — Z7985 Long-term (current) use of injectable non-insulin antidiabetic drugs: Secondary | ICD-10-CM

## 2023-11-20 DIAGNOSIS — I10 Essential (primary) hypertension: Secondary | ICD-10-CM

## 2023-11-20 DIAGNOSIS — E1169 Type 2 diabetes mellitus with other specified complication: Secondary | ICD-10-CM

## 2023-11-20 DIAGNOSIS — E118 Type 2 diabetes mellitus with unspecified complications: Secondary | ICD-10-CM

## 2023-11-20 DIAGNOSIS — N138 Other obstructive and reflux uropathy: Secondary | ICD-10-CM

## 2023-11-20 DIAGNOSIS — E785 Hyperlipidemia, unspecified: Secondary | ICD-10-CM

## 2023-11-20 DIAGNOSIS — N401 Enlarged prostate with lower urinary tract symptoms: Secondary | ICD-10-CM

## 2023-11-20 MED ORDER — TERAZOSIN HCL 1 MG PO CAPS: 1.0000 mg | ORAL_CAPSULE | Freq: Every day | ORAL | 1 refills | Status: DC

## 2023-11-20 MED ORDER — ATORVASTATIN CALCIUM 10 MG PO TABS: ORAL_TABLET | ORAL | 1 refills | Status: DC

## 2023-11-20 NOTE — Assessment & Plan Note (Signed)
Controlled BP with normal exam. Current regimen is hydrochlorothiazide and lisinopril. Will continue same medications; encourage continued reduced sodium diet.

## 2023-11-20 NOTE — Patient Instructions (Addendum)
Start taking a stool softener every day to prevent constipation.  Schedule a diabetic Eye Exam.

## 2023-11-20 NOTE — Assessment & Plan Note (Addendum)
Blood sugars stable without hypoglycemic symptoms or events. Currently managed with Ozempic - rx'd by Cardiology.  He skipped the last dose due to constipation. Changes made last visit are none. Lab Results  Component Value Date   HGBA1C 5.6 07/15/2023  Recommend colace daily to combat constipation side effects

## 2023-11-20 NOTE — Progress Notes (Signed)
Date:  11/20/2023   Name:  Benjamin Medina   DOB:  [birth year]   MRN:  [id number]   Chief Complaint: Depression, Hypertension, and Diabetes  Hypertension This is a chronic problem. The problem is controlled. Pertinent negatives include no chest pain, headaches, palpitations or shortness of breath. Past treatments include ACE inhibitors and diuretics.  Diabetes He presents for his follow-up diabetic visit. He has type 2 diabetes mellitus. His disease course has been improving. Pertinent negatives for hypoglycemia include no headaches or tremors. Pertinent negatives for diabetes include no chest pain, no fatigue, no polydipsia and no polyuria. Current diabetic treatments: semaglutide.  Benign Prostatic Hypertrophy This is a chronic problem. The problem has been gradually worsening since onset. Irritative symptoms include frequency and urgency. Pertinent negatives include no dysuria or hematuria. Past treatments include tamsulosin. The treatment provided no relief.    Review of Systems  Constitutional:  Negative for appetite change, fatigue and unexpected weight change.  Eyes:  Negative for visual disturbance.  Respiratory:  Negative for cough, shortness of breath and wheezing.   Cardiovascular:  Negative for chest pain, palpitations and leg swelling.  Gastrointestinal:  Positive for constipation. Negative for abdominal pain and blood in stool.  Endocrine: Negative for polydipsia and polyuria.  Genitourinary:  Positive for difficulty urinating, frequency and urgency. Negative for dysuria and hematuria.  Skin:  Negative for color change and rash.  Neurological:  Negative for tremors, numbness and headaches.  Psychiatric/Behavioral:  Negative for dysphoric mood.      Lab Results  Component Value Date   NA 142 03/14/2023   K 4.9 03/14/2023   CO2 20 03/14/2023   GLUCOSE 94 03/14/2023   BUN 14 03/14/2023   CREATININE 1.06 03/14/2023   CALCIUM 9.7 03/14/2023   EGFR 75 03/14/2023    GFRNONAA >60 11/19/2022   Lab Results  Component Value Date   CHOL 150 03/14/2023   HDL 39 (L) 03/14/2023   LDLCALC 77 03/14/2023   TRIG 202 (H) 03/14/2023   CHOLHDL 3.8 03/14/2023   Lab Results  Component Value Date   TSH 1.040 07/09/2022   Lab Results  Component Value Date   HGBA1C 5.6 07/15/2023   Lab Results  Component Value Date   WBC 8.8 03/14/2023   HGB 14.5 03/14/2023   HCT 43.9 03/14/2023   MCV 93 03/14/2023   PLT 356 03/14/2023   Lab Results  Component Value Date   ALT 32 03/14/2023   AST 17 03/14/2023   ALKPHOS 97 03/14/2023   BILITOT <0.2 03/14/2023   No results found for: "25OHVITD2", "25OHVITD3", "VD25OH"   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   Generalized psoriasis 11/04/2015   Periodic limb movement 11/04/2015    No Known Allergies  Past Surgical History:  Procedure Laterality Date   BLEPHAROPLASTY Bilateral 12/30/2014   COLONOSCOPY WITH PROPOFOL N/A 05/01/2016   Procedure: COLONOSCOPY WITH PROPOFOL;  Surgeon: Earline Mayotte, MD;  Location: [facility];  Service: Endoscopy;  Laterality: N/A;   KNEE ARTHROSCOPY Left 01/14/2019   Procedure: ARTHROSCOPY KNEE WITH DEBRIDEMENT AND PARTIAL MEDIAL MENISCECTOMY;  Surgeon: Christena Flake, MD;  Location: [facility];  Service: Orthopedics;  Laterality: Left;   KNEE ARTHROSCOPY WITH MEDIAL MENISECTOMY Right 11/27/2016   Procedure: KNEE ARTHROSCOPY WITH MEDIAL MENISECTOMY;  Surgeon: Christena Flake, MD;  Location: [facility];  Service: Orthopedics;  Laterality: Right;   SINUS SURGERY WITH INSTATRAK     TOTAL KNEE  ARTHROPLASTY Right 11/28/2022   Procedure: TOTAL KNEE ARTHROPLASTY;  Surgeon: Christena Flake, MD;  Location: [facility] OR[facility];  Service: Orthopedics;  Laterality: Right;   VASECTOMY      Social History   Tobacco Use   Smoking status: Former    Current packs/day: 0.00    Types: Cigarettes    Quit date: 12/30/1985    Years since quitting: 37.9   Smokeless tobacco: Never  Vaping Use   Vaping status: Never Used  Substance Use Topics   Alcohol use: Yes    Alcohol/week: 4.0 standard drinks of alcohol    Types: 4 Standard drinks or equivalent per week    Comment: social   Drug use: No     Medication list has been reviewed and updated.  Current Meds  Medication Sig   clobetasol (TEMOVATE) 0.05 % GEL Apply 1 application topically 2 (two) times daily.   hydrochlorothiazide (MICROZIDE) 12.5 MG capsule Take 1 capsule (12.5 mg total) by mouth daily.   lisinopril (ZESTRIL) 40 MG tablet TAKE 1 TABLET BY MOUTH EVERY DAY   Semaglutide, 1 MG/DOSE, 4 MG/3ML SOPN Inject 1 mg into the skin once a week.   terazosin (HYTRIN) 1 MG capsule Take 1 capsule (1 mg total) by mouth at bedtime.   triamcinolone (KENALOG) 0.025 % cream Apply topically 2 (two) times daily.   [DISCONTINUED] atorvastatin (LIPITOR) 10 MG tablet TAKE 1 TABLET(10 MG) BY MOUTH DAILY       11/20/2023    9:56 AM 07/15/2023   10:08 AM 03/14/2023   11:09 AM 08/12/2022   11:28 AM  GAD 7 : Generalized Anxiety Score  Nervous, Anxious, on Edge 0 0 0 0  Control/stop worrying 0 0 0 0  Worry too much - different things 0 0 0 0  Trouble relaxing 0 0 0 0  Restless 0 0 0 0  Easily annoyed or irritable 0 0 1 0  Afraid - awful might happen 0 0 0 0  Total GAD 7 Score 0 0 1 0  Anxiety Difficulty Not difficult at all Not difficult at all Not difficult at all Not difficult at all       11/20/2023    9:55 AM 07/15/2023   10:08 AM 03/19/2023   11:30 AM  Depression screen PHQ 2/9  Decreased Interest 0 0 0  Down, Depressed, Hopeless 0 0 0  PHQ - 2  Score 0 0 0  Altered sleeping 0 0 0  Tired, decreased energy 3 0 0  Change in appetite 0 0 0  Feeling bad or failure about yourself  0 0 0  Trouble concentrating 0 0 0  Moving slowly or fidgety/restless 0 0 0  Suicidal thoughts 0 0 0  PHQ-9 Score 3 0 0  Difficult doing work/chores Not difficult at all Not difficult at all Not difficult at all    BP Readings from Last 3 Encounters:  11/20/23 128/72  07/15/23 128/62  03/21/23 (!) 140/90    Physical Exam Vitals and nursing note reviewed.  Constitutional:      General: He is not in acute distress.    Appearance: He is well-developed.  HENT:     Head: Normocephalic and atraumatic.  Cardiovascular:     Rate and Rhythm: Normal rate and regular rhythm.     Heart sounds: No murmur heard. Pulmonary:     Effort: Pulmonary effort is normal. No respiratory distress.     Breath sounds: No wheezing or rhonchi.  Musculoskeletal:     Cervical back: Normal range of motion.     Right lower leg: No edema.     Left lower leg: No edema.  Lymphadenopathy:     Cervical: No cervical adenopathy.  Skin:    General: Skin is warm and dry.     Findings: No rash.  Neurological:     Mental Status: He is alert and oriented to person, place, and time.  Psychiatric:        Mood and Affect: Mood normal.        Behavior: Behavior normal.   IPSS Questionnaire (AUA-7): Over the past month.   1)  How often have you had a sensation of not emptying your bladder completely after you finish urinating?  4 - More than half the time  2)  How often have you had to urinate again less than two hours after you finished urinating? 4 - More than half the time  3)  How often have you found you stopped and started again several times when you urinated?  4 - More than half the time  4) How difficult have you found it to postpone urination?  5 - Almost always  5) How often have you had a weak urinary stream?  5 - Almost always  6) How often have you had to push or  strain to begin urination?  3 - About half the time  7) How many times did you most typically get up to urinate from the time you went to bed until the time you got up in the morning?  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 Did not respond well to Flomax Will try Hytrin and refer to Urology if no benefit      Relevant Medications   terazosin (HYTRIN) 1 MG capsule   DM type 2, controlled, with complication (HCC) - Primary (Chronic)    Blood sugars stable without hypoglycemic symptoms or events. Currently managed with Ozempic - rx'd by Cardiology.  He skipped the last dose due to constipation. Changes made last visit are none. Lab Results  Component Value Date   HGBA1C 5.6 07/15/2023  Recommend colace daily to combat constipation side effects       Relevant Medications   atorvastatin (LIPITOR) 10 MG tablet   Other Relevant Orders   Basic metabolic panel   Hemoglobin A1c   Essential hypertension (Chronic)    Controlled BP with normal exam. Current regimen is hydrochlorothiazide and lisinopril. Will continue same medications; encourage continued reduced sodium diet.       Relevant Medications   atorvastatin (  LIPITOR) 10 MG tablet   terazosin (HYTRIN) 1 MG capsule   Hyperlipidemia associated with type 2 diabetes mellitus (HCC) (Chronic)   Relevant Medications   atorvastatin (LIPITOR) 10 MG tablet   terazosin (HYTRIN) 1 MG capsule   Other Visit Diagnoses     Long-term current use of  injectable noninsulin antidiabetic medication           Return in about 4 months (around 03/19/2024) for CPX.    Reubin Milan, MD [facility] [place]

## 2023-11-20 NOTE — Assessment & Plan Note (Addendum)
Elevated IPSS score. Did not respond well to Flomax Will try Hytrin and refer to Urology if no benefit

## 2023-11-21 LAB — BASIC METABOLIC PANEL
BUN/Creatinine Ratio: 12 (ref 10–24)
BUN: 13 mg/dL (ref 8–27)
CO2: 19 mmol/L — ABNORMAL LOW (ref 20–29)
Calcium: 9.8 mg/dL (ref 8.6–10.2)
Chloride: 103 mmol/L (ref 96–106)
Creatinine, Ser: 1.11 mg/dL (ref 0.76–1.27)
Glucose: 104 mg/dL — ABNORMAL HIGH (ref 70–99)
Potassium: 4.5 mmol/L (ref 3.5–5.2)
Sodium: 138 mmol/L (ref 134–144)
eGFR: 71 mL/min/{1.73_m2} (ref >59)

## 2023-11-21 LAB — HEMOGLOBIN A1C
Est. average glucose Bld gHb Est-mCnc: 140 mg/dL
Hgb A1c MFr Bld: 6.5 % — ABNORMAL HIGH (ref 4.8–5.6)

## 2023-11-21 NOTE — Telephone Encounter (Signed)
Requested medication (s) are due for refill today: No  Requested medication (s) are on the active medication list: Yes  Last refill:  11/20/23 #30, 1RF  Future visit scheduled: Yes  Notes to clinic:  Patient requesting 90-day supply     Requested Prescriptions  Pending Prescriptions Disp Refills   terazosin (HYTRIN) 1 MG capsule [Pharmacy Med Name: TERAZOSIN 1MG  CAPSULES] 90 capsule     Sig: TAKE 1 CAPSULE(1 MG) BY MOUTH AT BEDTIME     Cardiovascular:  Alpha Blockers Passed - 11/20/2023 10:49 AM      Passed - Last BP in normal range    BP Readings from Last 1 Encounters:  11/20/23 128/72         Passed - Valid encounter within last 6 months    Recent Outpatient Visits           Yesterday DM type 2, controlled, with complication [facility])   [place] [facility] at [facility], Nyoka Cowden, MD   4 months ago DM type 2, controlled, with complication [facility])   [place] [facility] at [facility], Nyoka Cowden, MD   8 months ago Annual physical exam   [facility] at [facility], Nyoka Cowden, MD   1 year ago Essential hypertension   [place] [facility] at [facility], Nyoka Cowden, MD   1 year ago Fatigue, unspecified type   [facility] at [facility], [facility]       Future Appointments             In 4 months Judithann Graves, Nyoka Cowden, MD [facility] [place] [facility] at [facility], [facility]

## 2023-11-24 DIAGNOSIS — Z96651 Presence of right artificial knee joint: Secondary | ICD-10-CM | POA: Diagnosis not present

## 2023-11-24 DIAGNOSIS — M1711 Unilateral primary osteoarthritis, right knee: Secondary | ICD-10-CM | POA: Diagnosis not present

## 2023-12-08 ENCOUNTER — Ambulatory Visit (INDEPENDENT_AMBULATORY_CARE_PROVIDER_SITE_OTHER): Payer: Medicare Other | Admitting: Internal Medicine

## 2023-12-08 ENCOUNTER — Encounter: Payer: Self-pay | Admitting: Internal Medicine

## 2023-12-08 VITALS — BP 102/56 | HR 95 | Ht 75.0 in | Wt 296.0 lb

## 2023-12-08 DIAGNOSIS — H6123 Impacted cerumen, bilateral: Secondary | ICD-10-CM

## 2023-12-08 DIAGNOSIS — N138 Other obstructive and reflux uropathy: Secondary | ICD-10-CM | POA: Diagnosis not present

## 2023-12-08 DIAGNOSIS — N401 Enlarged prostate with lower urinary tract symptoms: Secondary | ICD-10-CM

## 2023-12-08 NOTE — Progress Notes (Signed)
Date:  12/08/2023   Name:  Benjamin Medina   DOB:  1951/02/13   MRN:  [id number]   Chief Complaint: Cerumen Impaction  Ear Fullness  There is pain in the right ear. This is a new problem. Episode onset: X3 day. The problem has been unchanged. There has been no fever. The pain is at a severity of 4/10. The pain is mild. Associated symptoms include ear discharge and hearing loss. Pertinent negatives include no headaches.  Benign Prostatic Hypertrophy This is a new problem. Irritative symptoms include frequency and nocturia. Obstructive symptoms include incomplete emptying and a slower stream. Pertinent negatives include no chills or dysuria. He is sexually active. Past treatments include terazosin. The treatment provided mild relief.      Medication list has been reviewed and updated.  Current Meds  Medication Sig   atorvastatin (LIPITOR) 10 MG tablet TAKE 1 TABLET(10 MG) BY MOUTH DAILY   clobetasol (TEMOVATE) 0.05 % GEL Apply 1 application topically 2 (two) times daily.   hydrochlorothiazide (MICROZIDE) 12.5 MG capsule Take 1 capsule (12.5 mg total) by mouth daily.   lisinopril (ZESTRIL) 40 MG tablet TAKE 1 TABLET BY MOUTH EVERY DAY   Semaglutide, 1 MG/DOSE, 4 MG/3ML SOPN Inject 1 mg into the skin once a week.   terazosin (HYTRIN) 1 MG capsule Take 1 capsule (1 mg total) by mouth at bedtime.   triamcinolone (KENALOG) 0.025 % cream Apply topically 2 (two) times daily.     Review of Systems  Constitutional:  Negative for chills and fever.  HENT:  Positive for ear discharge and hearing loss. Negative for facial swelling.   Respiratory:  Negative for chest tightness and shortness of breath.   Genitourinary:  Positive for frequency, incomplete emptying and nocturia. Negative for difficulty urinating and dysuria.  Neurological:  Negative for dizziness, light-headedness and headaches.  Psychiatric/Behavioral:  Negative for dysphoric mood and sleep disturbance. The patient is not  nervous/anxious.     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   Generalized psoriasis 11/04/2015   Periodic limb movement 11/04/2015    No Known Allergies  Immunization History  Administered Date(s) Administered   Fluad Quad(high Dose 65+) 09/25/2020, 03/14/2023   Influenza,inj,Quad PF,6+ Mos 12/10/2015, 10/15/2018, 10/09/2019   Influenza-Unspecified 09/27/2021, 10/14/2023   Moderna Sars-Covid-2 Vaccination 11/29/2020, 04/19/2021   PFIZER(Purple Top)SARS-COV-2 Vaccination 02/13/2020, 03/07/2020   Pfizer(Comirnaty)Fall Seasonal Vaccine 12 years and older 03/14/2023   Pneumococcal Conjugate-13 10/15/2018   Pneumococcal Polysaccharide-23 09/25/2020   Tdap 05/08/2011   Zoster, Live 05/08/2011    Past Surgical History:  Procedure Laterality Date   BLEPHAROPLASTY Bilateral 12/30/2014   COLONOSCOPY WITH PROPOFOL N/A 05/01/2016   Procedure: COLONOSCOPY WITH PROPOFOL;  Surgeon: Earline Mayotte, MD;  Location: [facility];  Service: Endoscopy;  Laterality: N/A;   KNEE ARTHROSCOPY Left 01/14/2019   Procedure: ARTHROSCOPY KNEE WITH DEBRIDEMENT AND PARTIAL MEDIAL MENISCECTOMY;  Surgeon: Christena Flake, MD;  Location: [facility] OR[facility];  Service: Orthopedics;  Laterality: Left;   KNEE ARTHROSCOPY WITH MEDIAL MENISECTOMY Right 11/27/2016   Procedure: KNEE ARTHROSCOPY WITH MEDIAL MENISECTOMY;  Surgeon: Christena Flake, MD;  Location[facility];  Service:  Orthopedics;  Laterality: Right;   SINUS SURGERY WITH INSTATRAK     TOTAL KNEE ARTHROPLASTY Right 11/28/2022   Procedure: TOTAL KNEE ARTHROPLASTY;  Surgeon: Christena Flake, MD;  Location: [facility];  Service: Orthopedics;  Laterality: Right;   VASECTOMY      Social History   Tobacco Use   Smoking status: Former    Current packs/day: 0.00    Types: Cigarettes    Quit date: 12/30/1985    Years since quitting: 37.9   Smokeless tobacco: Never  Vaping Use   Vaping status: Never Used  Substance Use Topics   Alcohol use: Yes    Alcohol/week: 4.0 standard drinks of alcohol    Types: 4 Standard drinks or equivalent per week    Comment: social   Drug use: No    Family History  Problem Relation Age of Onset   Colon cancer Mother 72   Lymphoma Father         11/20/2023    9:56 AM 07/15/2023   10:08 AM 03/14/2023   11:09 AM 08/12/2022   11:28 AM  GAD 7 : Generalized Anxiety Score  Nervous, Anxious, on Edge 0 0 0 0  Control/stop worrying 0 0 0 0  Worry too much - different things 0 0 0 0  Trouble relaxing 0 0 0 0  Restless 0 0 0 0  Easily annoyed or irritable 0 0 1 0  Afraid - awful might happen 0 0 0 0  Total GAD 7 Score 0 0 1 0  Anxiety Difficulty Not difficult at all Not difficult at all Not difficult at all Not difficult at all       11/20/2023    9:55 AM 07/15/2023   10:08 AM 03/19/2023   11:30 AM  Depression screen PHQ 2/9  Decreased Interest 0 0 0  Down, Depressed, Hopeless 0 0 0  PHQ - 2 Score 0 0 0  Altered sleeping 0 0 0  Tired, decreased energy 3 0 0  Change in appetite 0 0 0  Feeling bad or failure about yourself  0 0 0  Trouble concentrating 0 0 0  Moving slowly or fidgety/restless 0 0 0  Suicidal thoughts 0 0 0  PHQ-9 Score 3 0 0  Difficult doing work/chores Not difficult at all Not difficult at all Not difficult at all    BP Readings from Last 3 Encounters:  12/08/23 (!) 102/56  11/20/23 128/72  07/15/23 128/62    Wt Readings from Last 3 Encounters:  12/08/23 296 lb (134.3 kg)  11/20/23 298 lb (135.2 kg)  07/15/23 290 lb (131.5 kg)    BP (!) 102/56   Pulse 95   Ht 6\' 3"  (1.905 m)   Wt 296 lb (134.3 kg)    SpO2 96%   BMI 37.00 kg/m   Physical Exam Vitals and nursing note reviewed.  Constitutional:      General: He is not in acute distress.    Appearance: He is well-developed.  HENT:     Head: Normocephalic and atraumatic.     Right Ear: Hearing normal.     Left Ear: Hearing normal.     Ears:     Comments: Excessive cerumen both ears - flushed with complete resolution of symptoms and no residual cerumen on exam. Cardiovascular:     Rate and Rhythm: Normal rate and regular rhythm.  Pulmonary:     Effort: Pulmonary effort is normal. No respiratory distress.     Breath sounds: No  wheezing or rhonchi.  Skin:    General: Skin is warm and dry.     Findings: No rash.  Neurological:     Mental Status: He is alert and oriented to person, place, and time.  Psychiatric:        Mood and Affect: Mood normal.        Behavior: Behavior normal.     Recent Labs     Component Value Date/Time   NA 138 11/20/2023 1025   K 4.5 11/20/2023 1025   CL 103 11/20/2023 1025   CO2 19 (L) 11/20/2023 1025   GLUCOSE 104 (H) 11/20/2023 1025   GLUCOSE 166 (H) 11/19/2022 1120   BUN 13 11/20/2023 1025   CREATININE 1.11 11/20/2023 1025   CALCIUM 9.8 11/20/2023 1025   PROT 7.4 03/14/2023 1139   ALBUMIN 4.6 03/14/2023 1139   AST 17 03/14/2023 1139   ALT 32 03/14/2023 1139   ALKPHOS 97 03/14/2023 1139   BILITOT <0.2 03/14/2023 1139   GFRNONAA >60 11/19/2022 1120   GFRAA 81 09/25/2020 1409    Lab Results  Component Value Date   WBC 8.8 03/14/2023   HGB 14.5 03/14/2023   HCT 43.9 03/14/2023   MCV 93 03/14/2023   PLT 356 03/14/2023   Lab Results  Component Value Date   HGBA1C 6.5 (H) 11/20/2023   Lab Results  Component Value Date   CHOL 150 03/14/2023   HDL 39 (L) 03/14/2023   LDLCALC 77 03/14/2023   TRIG 202 (H) 03/14/2023   CHOLHDL 3.8 03/14/2023   Lab Results  Component Value Date   TSH 1.040 07/09/2022     Assessment and Plan: Problem List Items Addressed This Visit        Unprioritized   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 Will continue daily dosing and expect some ongoing improvement      Other Visit Diagnoses     Excessive cerumen in both ear canals    -  Primary   successfully irrigated with resolution of symptoms

## 2023-12-08 NOTE — Assessment & Plan Note (Signed)
Some improvement in nocturia on Hytrin. Will continue daily dosing and expect some ongoing improvement

## 2023-12-16 ENCOUNTER — Encounter: Payer: Self-pay | Admitting: Internal Medicine

## 2023-12-18 DIAGNOSIS — K080 Exfoliation of teeth due to systemic causes: Secondary | ICD-10-CM | POA: Diagnosis not present

## 2024-01-19 ENCOUNTER — Other Ambulatory Visit: Payer: Self-pay

## 2024-01-19 DIAGNOSIS — N138 Other obstructive and reflux uropathy: Secondary | ICD-10-CM

## 2024-01-20 ENCOUNTER — Other Ambulatory Visit
Admission: RE | Admit: 2024-01-20 | Discharge: 2024-01-20 | Disposition: A | Discharge status: Home / Self Care | Payer: Medicare Other | Attending: Urology | Admitting: Urology

## 2024-01-20 ENCOUNTER — Ambulatory Visit: Payer: Medicare Other | Admitting: Urology

## 2024-01-20 DIAGNOSIS — N401 Enlarged prostate with lower urinary tract symptoms: Secondary | ICD-10-CM | POA: Insufficient documentation

## 2024-01-20 DIAGNOSIS — N138 Other obstructive and reflux uropathy: Secondary | ICD-10-CM | POA: Insufficient documentation

## 2024-01-20 LAB — URINALYSIS, COMPLETE (UACMP) WITH MICROSCOPIC
Bilirubin Urine: NEGATIVE
Glucose, UA: NEGATIVE mg/dL
Hgb urine dipstick: NEGATIVE
Ketones, ur: NEGATIVE mg/dL
Leukocytes,Ua: NEGATIVE
Nitrite: NEGATIVE
RBC / HPF: NONE SEEN RBC/hpf (ref 0–5)
Specific Gravity, Urine: 1.025 (ref 1.005–1.030)
pH: 5.5 (ref 5.0–8.0)

## 2024-01-20 LAB — BLADDER SCAN AMB NON-IMAGING

## 2024-01-20 NOTE — Patient Instructions (Signed)

## 2024-01-20 NOTE — Progress Notes (Signed)
   01/20/2024 4:12 PM   Benjamin Medina 05-29-1951 [id number]  Reason for visit: Follow up Urinary symptoms, history of hematuria, kidney stones  HPI: 74 year old male who I previously met in January 2023 after he had an episode of gross hematuria that he thinks was associated with a kidney stone.  He also was treated with a 10-day course of antibiotics at that time for possible prostate infection.  He also had an elevated PVR of .  Urinalysis at that visit was benign, PSA was normal at 1.6, and I recommended CT for further evaluation and consideration of cystoscopy, however he never followed up.  He made appointment today for worsening urinary symptoms.  PVR is elevated again today at .  He has not had any further episodes of gross hematuria, but urinary symptoms have worsened with weak stream, urinary dribbling, urgency/frequency, nocturia 1-4 times overnight.  He is on terazosin through PCP for the last month which he feels has mildly improved his symptoms.  Urinalysis today is benign.  PSA remains normal at 1.8 from March 2024.  I recommended further evaluation of his urinary symptoms with cystoscopy/TRUS and consideration of outlet procedures.  We reviewed possible etiologies including BPH, urethral stricture, and would strongly recommend cystoscopy with his history of gross hematuria.  Cystoscopy/TRUS for further evaluation of outlet procedures for suspected BPH  Sondra Come, MD  [facility] [address] [place], [place] [address] [phone]

## 2024-02-07 ENCOUNTER — Other Ambulatory Visit: Payer: Self-pay | Admitting: Internal Medicine

## 2024-02-09 NOTE — Telephone Encounter (Signed)
 D/C 03/28/22. Requested Prescriptions  Refused Prescriptions Disp Refills   meloxicam  (MOBIC ) 7.5 MG tablet [Pharmacy Med Name: MELOXICAM  7.5MG  TABLETS] 90 tablet 1    Sig: TAKE 1 TABLET(7.5 MG) BY MOUTH DAILY     Analgesics:  COX2 Inhibitors Failed - 02/09/2024  2:01 PM      Failed - Manual Review: Labs are only required if the patient has taken medication for more than 8 weeks.      Passed - HGB in normal range and within 360 days    Hemoglobin  Date Value Ref Range Status  03/14/2023 14.5 13.0 - 17.7 g/dL Final         Passed - Cr in normal range and within 360 days    Creatinine, Ser  Date Value Ref Range Status  11/20/2023 1.11 0.76 - 1.27 mg/dL Final         Passed - HCT in normal range and within 360 days    Hematocrit  Date Value Ref Range Status  03/14/2023 43.9 37.5 - 51.0 % Final         Passed - AST in normal range and within 360 days    AST  Date Value Ref Range Status  03/14/2023 17 0 - 40 IU/L Final         Passed - ALT in normal range and within 360 days    ALT  Date Value Ref Range Status  03/14/2023 32 0 - 44 IU/L Final   ALT (SGPT) P5P  Date Value Ref Range Status  01/10/2020 98 (H) 0 - 55 IU/L Final         Passed - eGFR is 30 or above and within 360 days    GFR calc Af Amer  Date Value Ref Range Status  09/25/2020 81 >59 mL/min/1.73 Final    Comment:    **[facility] currently reports eGFR in compliance with the current**   recommendations of the [employer]. [facility] will   update reporting as new guidelines are published from the NKF-ASN   Task force.    GFR, Estimated  Date Value Ref Range Status  11/19/2022 >60 >60 mL/min Final    Comment:    (NOTE) Calculated using the CKD-EPI Creatinine Equation (2021)    eGFR  Date Value Ref Range Status  11/20/2023 71 >59 mL/min/1.73 Final         Passed - Patient is not pregnant      Passed - Valid encounter within last 12 months    Recent Outpatient Visits           2  months ago Excessive cerumen in both ear canals   [place] [facility] at [facility], Chales [place], MD   2 months ago DM type 2, controlled, with complication [facility])   [place] [facility] at [facility], Chales [place], MD   6 months ago DM type 2, controlled, with complication [facility])   [place] [facility] at [facility], Chales [place], MD   11 months ago Annual physical exam   [facility] at [facility], Chales [place], MD   1 year ago Essential hypertension   [facility] at [facility], Chales [place], MD       Future Appointments             In 1 month Gala Jubilee Chales [place], MD [facility] at [facility], [facility]

## 2024-02-12 DIAGNOSIS — K080 Exfoliation of teeth due to systemic causes: Secondary | ICD-10-CM | POA: Diagnosis not present

## 2024-02-19 ENCOUNTER — Encounter: Payer: Medicare Other | Admitting: Urology

## 2024-02-28 ENCOUNTER — Other Ambulatory Visit: Payer: Self-pay | Admitting: Internal Medicine

## 2024-02-28 DIAGNOSIS — I10 Essential (primary) hypertension: Secondary | ICD-10-CM

## 2024-03-01 NOTE — Telephone Encounter (Signed)
 Requested Prescriptions  Pending Prescriptions Disp Refills   hydrochlorothiazide (MICROZIDE) 12.5 MG capsule [Pharmacy Med Name: HYDROCHLOROTHIAZIDE 12.5MG  CAPSULES] 90 capsule 0    Sig: TAKE 1 CAPSULE(12.5 MG) BY MOUTH DAILY     Cardiovascular: Diuretics - Thiazide Passed - 03/01/2024  2:19 PM      Passed - Cr in normal range and within 180 days    Creatinine, Ser  Date Value Ref Range Status  11/20/2023 1.11 0.76 - 1.27 mg/dL Final         Passed - K in normal range and within 180 days    Potassium  Date Value Ref Range Status  11/20/2023 4.5 3.5 - 5.2 mmol/L Final         Passed - Na in normal range and within 180 days    Sodium  Date Value Ref Range Status  11/20/2023 138 134 - 144 mmol/L Final         Passed - Last BP in normal range    BP Readings from Last 1 Encounters:  01/20/24 134/85         Passed - Valid encounter within last 6 months    Recent Outpatient Visits           2 months ago Excessive cerumen in both ear canals   [place] [facility] at [facility], Nyoka Cowden, MD   3 months ago DM type 2, controlled, with complication [facility])   [place] [facility] at [facility], Nyoka Cowden, MD   7 months ago DM type 2, controlled, with complication [facility])   [place] [facility] at [facility], Nyoka Cowden, MD   11 months ago Annual physical exam   [facility] at [facility], Nyoka Cowden, MD   1 year ago Essential hypertension   [place] [facility] at [facility], Nyoka Cowden, MD       Future Appointments             In 1 month Judithann Graves, Nyoka Cowden, MD [facility] at [facility], [facility]

## 2024-03-08 DIAGNOSIS — H25013 Cortical age-related cataract, bilateral: Secondary | ICD-10-CM | POA: Diagnosis not present

## 2024-03-08 DIAGNOSIS — E119 Type 2 diabetes mellitus without complications: Secondary | ICD-10-CM | POA: Diagnosis not present

## 2024-03-08 DIAGNOSIS — H1045 Other chronic allergic conjunctivitis: Secondary | ICD-10-CM | POA: Diagnosis not present

## 2024-03-08 LAB — HM DIABETES EYE EXAM

## 2024-03-09 ENCOUNTER — Other Ambulatory Visit: Payer: Self-pay | Admitting: Internal Medicine

## 2024-03-09 DIAGNOSIS — N138 Other obstructive and reflux uropathy: Secondary | ICD-10-CM

## 2024-03-10 NOTE — Telephone Encounter (Signed)
 Requested Prescriptions  Pending Prescriptions Disp Refills   terazosin (HYTRIN) 1 MG capsule [Pharmacy Med Name: TERAZOSIN 1MG  CAPSULES] 90 capsule 0    Sig: TAKE 1 CAPSULE(1 MG) BY MOUTH AT BEDTIME     Cardiovascular:  Alpha Blockers Passed - 03/10/2024  9:44 AM      Passed - Last BP in normal range    BP Readings from Last 1 Encounters:  01/20/24 134/85         Passed - Valid encounter within last 6 months    Recent Outpatient Visits           3 months ago Excessive cerumen in both ear canals   [place] [facility] at [facility], Nyoka Cowden, MD   3 months ago DM type 2, controlled, with complication [facility])   [place] [facility] at [facility], Nyoka Cowden, MD   7 months ago DM type 2, controlled, with complication [facility])   [place] [facility] at [facility], Nyoka Cowden, MD   12 months ago Annual physical exam   [facility] Primary [facility] & [facility] Medicine at [facility], Nyoka Cowden, MD   1 year ago Essential hypertension   [place] [facility] at [facility], Nyoka Cowden, MD       Future Appointments             In 3 weeks Judithann Graves Nyoka Cowden, MD [facility] Primary Care & Sports Medicine at [facility], [facility]

## 2024-03-24 ENCOUNTER — Ambulatory Visit: Payer: Medicare Other

## 2024-03-24 DIAGNOSIS — Z0000 Encounter for general adult medical examination without abnormal findings: Secondary | ICD-10-CM

## 2024-03-24 NOTE — Patient Instructions (Addendum)
 Benjamin Medina , Thank you for taking time to come for your Medicare Wellness Visit. I appreciate your ongoing commitment to your health goals. Please review the following plan we discussed and let me know if I can assist you in the future.   Referrals/Orders/Follow-Ups/Clinician Recommendations: NONE  This is a list of the screening recommended for you and due dates:  Health Maintenance  Topic Date Due   Zoster (Shingles) Vaccine (1 of 2) 09/16/1971   DTaP/Tdap/Td vaccine (2 - Td or Tdap) 05/07/2021   Eye exam for diabetics  12/03/2022   COVID-19 Vaccine (6 - 2024-25 season) 08/31/2023   Yearly kidney health urinalysis for diabetes  03/13/2024   Complete foot exam   03/13/2024   Hemoglobin A1C  05/19/2024   Yearly kidney function blood test for diabetes  11/19/2024   Medicare Annual Wellness Visit  03/24/2025   Colon Cancer Screening  05/01/2026   Pneumonia Vaccine  Completed   Flu Shot  Completed   Hepatitis C Screening  Completed   HPV Vaccine  Aged Out    Advanced directives: (ACP Link)Information on Advanced Care Planning can be found at [facility] [place] Advance Health Care Directives Advance Health Care Directives. [email or web address]   Next Medicare Annual Wellness Visit scheduled for next year: Yes   03/30/25 @ 9:30 AM BY PHONE

## 2024-03-24 NOTE — Progress Notes (Signed)
 Subjective:   Benjamin Medina is a 73 y.o. who presents for a Medicare Wellness preventive visit.  Visit Complete: Virtual I connected with  Benjamin Medina on 03/24/24 by a audio enabled telemedicine application and verified that I am speaking with the correct person using two identifiers.  Patient Location: Home  Provider Location: Office/Clinic  I discussed the limitations of evaluation and management by telemedicine. The patient expressed understanding and agreed to proceed.  Vital Signs: Because this visit was a virtual/telehealth visit, some criteria may be missing or patient reported. Any vitals not documented were not able to be obtained and vitals that have been documented are patient reported.  VideoDeclined- This patient declined Librarian, academic. Therefore the visit was completed with audio only.  Persons Participating in Visit: Patient.  AWV Questionnaire: No: Patient Medicare AWV questionnaire was not completed prior to this visit.  Cardiac Risk Factors include: advanced age (>57men, >32 women);dyslipidemia;hypertension;male gender;obesity (BMI >30kg/m2)     Objective:    There were no vitals filed for this visit. There is no height or weight on file to calculate BMI.     03/24/2024   10:18 AM 03/19/2023   11:31 AM 11/28/2022    6:31 AM 11/19/2022   11:40 AM 03/13/2022   10:49 AM 03/12/2021   11:04 AM 01/14/2019   11:22 AM  Advanced Directives  Does Patient Have a Medical Advance Directive? No No Yes Yes No -- No  Type of Best boy of [place];Living will Living will     Copy of Healthcare Power of Attorney in Chart?   No - copy requested      Would patient like information on creating a medical advance directive? No - Patient declined No - Patient declined No - Patient declined  Yes (MAU/Ambulatory/Procedural Areas - Information given)  No - Patient declined    Current Medications  (verified) Outpatient Encounter Medications as of 03/24/2024  Medication Sig   atorvastatin (LIPITOR) 10 MG tablet TAKE 1 TABLET(10 MG) BY MOUTH DAILY   clobetasol (TEMOVATE) 0.05 % GEL Apply 1 application topically 2 (two) times daily.   hydrochlorothiazide (MICROZIDE) 12.5 MG capsule TAKE 1 CAPSULE(12.5 MG) BY MOUTH DAILY   lisinopril (ZESTRIL) 40 MG tablet TAKE 1 TABLET BY MOUTH EVERY DAY   terazosin (HYTRIN) 1 MG capsule TAKE 1 CAPSULE(1 MG) BY MOUTH AT BEDTIME   triamcinolone (KENALOG) 0.025 % cream Apply topically as needed.   Semaglutide, 1 MG/DOSE, 4 MG/3ML SOPN Inject 1 mg into the skin once a week. (Patient not taking: Reported on 03/24/2024)   No facility-administered encounter medications on file as of 03/24/2024.    Allergies (verified) Patient has no allergy information on record.   History: Past Medical History:  Diagnosis Date   Arthritis    Auditory impairment    BPH with urinary obstruction    Contracture of hand    PALMAR FASCIA   Diabetes mellitus without complication (HCC)    Hyperlipidemia    Hypertension    Psoriasis    Past Surgical History:  Procedure Laterality Date   BLEPHAROPLASTY Bilateral 12/30/2014   COLONOSCOPY WITH PROPOFOL N/A 05/01/2016   Procedure: COLONOSCOPY WITH PROPOFOL;  Surgeon: Earline Mayotte, MD;  Location: [facility] ENDOSCOPY;  Service: Endoscopy;  Laterality: N/A;   KNEE ARTHROSCOPY Left 01/14/2019   Procedure: ARTHROSCOPY KNEE WITH DEBRIDEMENT AND PARTIAL MEDIAL MENISCECTOMY;  Surgeon: Christena Flake, MD;  Location: [facility] ORS;  Service: Orthopedics;  Laterality: Left;  KNEE ARTHROSCOPY WITH MEDIAL MENISECTOMY Right 11/27/2016   Procedure: KNEE ARTHROSCOPY WITH MEDIAL MENISECTOMY;  Surgeon: Christena Flake, MD;  Location: [facility];  Service: Orthopedics;  Laterality: Right;   SINUS SURGERY WITH INSTATRAK     TOTAL KNEE ARTHROPLASTY Right 11/28/2022   Procedure: TOTAL KNEE ARTHROPLASTY;  Surgeon: Christena Flake, MD;  Location:  [facility] ORS;  Service: Orthopedics;  Laterality: Right;   VASECTOMY     Family History  Problem Relation Age of Onset   Colon cancer Mother 31   Lymphoma Father    Social History   Socioeconomic History   Marital status: Married    Spouse name: Not on file   Number of children: 1   Years of education: Not on file   Highest education level: High school graduate  Occupational History   Not on file  Tobacco Use   Smoking status: Former    Current packs/day: 0.00    Types: Cigarettes    Quit date: 12/30/1985    Years since quitting: 38.2   Smokeless tobacco: Never  Vaping Use   Vaping status: Never Used  Substance and Sexual Activity   Alcohol use: Yes    Alcohol/week: 4.0 standard drinks of alcohol    Types: 4 Standard drinks or equivalent per week    Comment: social   Drug use: No   Sexual activity: Yes    Birth control/protection: None  Other Topics Concern   Not on file  Social History Narrative   Works part time as Naval architect   Social Drivers of Corporate investment banker Strain: Low Risk  (03/24/2024)   Overall Financial Resource Strain (CARDIA)    Difficulty of Paying Living Expenses: Not hard at all  Food Insecurity: No Food Insecurity (03/24/2024)   Hunger Vital Sign    Worried About Running Out of Food in the Last Year: Never true    Ran Out of Food in the Last Year: Never true  Transportation Needs: No Transportation Needs (03/24/2024)   PRAPARE - Administrator, Civil Service (Medical): No    Lack of Transportation (Non-Medical): No  Physical Activity: Insufficiently Active (03/24/2024)   Exercise Vital Sign    Days of Exercise per Week: 3 days    Minutes of Exercise per Session: 20 min  Stress: No Stress Concern Present (03/24/2024)   [employer] of Occupational Health - Occupational Stress Questionnaire    Feeling of Stress : Not at all  Social Connections: Moderately Isolated (03/24/2024)   Social Connection and Isolation Panel [NHANES]     Frequency of Communication with Friends and Family: More than three times a week    Frequency of Social Gatherings with Friends and Family: Once a week    Attends Religious Services: Never    Database administrator or Organizations: No    Attends Engineer, structural: Never    Marital Status: Married    Tobacco Counseling Counseling given: Not Answered    Clinical Intake:  Pre-visit preparation completed: Yes  Pain : No/denies pain     BMI - recorded: 38.2 Nutritional Status: BMI > 30  Obese Nutritional Risks: None Diabetes: No  Lab Results  Component Value Date   HGBA1C 6.5 (H) 11/20/2023   HGBA1C 5.6 07/15/2023   HGBA1C 6.3 (H) 03/14/2023     How often do you need to have someone help you when you read instructions, pamphlets, or other written materials from your doctor or pharmacy?: 1 -  Never  Interpreter Needed?: No  Information entered by :: Kennedy Bucker, LPN   Activities of Daily Living     03/24/2024   10:19 AM  In your present state of health, do you have any difficulty performing the following activities:  Hearing? 1  Vision? 0  Difficulty concentrating or making decisions? 0  Walking or climbing stairs? 0  Dressing or bathing? 0  Doing errands, shopping? 0  Preparing Food and eating ? N  Using the Toilet? N  In the past six months, have you accidently leaked urine? N  Do you have problems with loss of bowel control? N  Managing your Medications? N  Managing your Finances? N  Housekeeping or managing your Housekeeping? N    Patient Care Team: Reubin Milan, MD as PCP - General (Internal Medicine) [facility] (Optometry) Jesusita Oka, MD (Dermatology) Poggi, Excell Seltzer, MD as Consulting Physician (Orthopedic Surgery) Sondra Come, MD as Consulting Physician (Urology) Trey Sailors, RN as Registered Nurse  Indicate any recent Medical Services you may have received from other than [facility] providers in the past year  (date may be approximate).     Assessment:   This is a routine wellness examination for Benjamin Medina.  Hearing/Vision screen Hearing Screening - Comments:: HAS AIDS, WEARS RARELY  Vision Screening - Comments:: READERS- WOODARD   Goals Addressed             This Visit's Progress    Cut out extra servings         Depression Screen     03/24/2024   10:16 AM 11/20/2023    9:55 AM 07/15/2023   10:08 AM 03/19/2023   11:30 AM 03/14/2023   11:09 AM 08/12/2022   11:27 AM 07/09/2022   10:50 AM  PHQ 2/9 Scores  PHQ - 2 Score 0 0 0 0 1 0 2  PHQ- 9 Score 0 3 0 0 3 2 6     Fall Risk     03/24/2024   10:18 AM 11/20/2023    9:55 AM 07/15/2023   10:08 AM 03/19/2023   11:32 AM 03/14/2023   11:09 AM  Fall Risk   Falls in the past year? 0 0 0 0 0  Number falls in past yr: 0 0 0 0 0  Injury with Fall? 0 0 0 0 0  Risk for fall due to : No Fall Risks No Fall Risks No Fall Risks No Fall Risks No Fall Risks  Follow up Falls prevention discussed;Falls evaluation completed Falls evaluation completed Falls evaluation completed Falls prevention discussed;Falls evaluation completed Falls evaluation completed    MEDICARE RISK AT HOME:  Medicare Risk at Home Any stairs in or around the home?: Yes If so, are there any without handrails?: Yes Home free of loose throw rugs in walkways, pet beds, electrical cords, etc?: Yes Adequate lighting in your home to reduce risk of falls?: Yes Life alert?: No Use of a cane, walker or w/c?: No Grab bars in the bathroom?: No Shower chair or bench in shower?: Yes Elevated toilet seat or a handicapped toilet?: No  TIMED UP AND GO:  Was the test performed?  No  Cognitive Function: 6CIT completed        03/24/2024   10:20 AM 03/19/2023   11:35 AM 03/12/2021   11:07 AM  6CIT Screen  What Year? 0 points 0 points 0 points  What month? 0 points 0 points 0 points  What time? 0 points 0 points 0  points  Count back from 20 0 points 0 points 0 points  Months in  reverse 4 points 4 points 4 points  Repeat phrase 0 points 0 points 0 points  Total Score 4 points 4 points 4 points    Immunizations Immunization History  Administered Date(s) Administered   Fluad Quad(high Dose 65+) 09/25/2020, 03/14/2023   Influenza,inj,Quad PF,6+ Mos 12/10/2015, 10/15/2018, 10/09/2019   Influenza-Unspecified 09/27/2021, 10/14/2023   Moderna Sars-Covid-2 Vaccination 11/29/2020, 04/19/2021   PFIZER(Purple Top)SARS-COV-2 Vaccination 02/13/2020, 03/07/2020   Pfizer(Comirnaty)Fall Seasonal Vaccine 12 years and older 03/14/2023   Pneumococcal Conjugate-13 10/15/2018   Pneumococcal Polysaccharide-23 09/25/2020   Tdap 05/08/2011   Zoster, Live 05/08/2011    Screening Tests Health Maintenance  Topic Date Due   Zoster Vaccines- Shingrix (1 of 2) 09/16/1971   DTaP/Tdap/Td (2 - Td or Tdap) 05/07/2021   OPHTHALMOLOGY EXAM  12/03/2022   COVID-19 Vaccine (6 - 2024-25 season) 08/31/2023   Diabetic kidney evaluation - Urine ACR  03/13/2024   FOOT EXAM  03/13/2024   HEMOGLOBIN A1C  05/19/2024   Diabetic kidney evaluation - eGFR measurement  11/19/2024   Medicare Annual Wellness (AWV)  03/24/2025   Colonoscopy  05/01/2026   Pneumonia Vaccine 79+ Years old  Completed   INFLUENZA VACCINE  Completed   Hepatitis C Screening  Completed   HPV VACCINES  Aged Out    Health Maintenance  Health Maintenance Due  Topic Date Due   Zoster Vaccines- Shingrix (1 of 2) 09/16/1971   DTaP/Tdap/Td (2 - Td or Tdap) 05/07/2021   OPHTHALMOLOGY EXAM  12/03/2022   COVID-19 Vaccine (6 - 2024-25 season) 08/31/2023   Diabetic kidney evaluation - Urine ACR  03/13/2024   FOOT EXAM  03/13/2024   Health Maintenance Items Addressed: UP TO DATE ON SHOTS EXCEPT SHINGRIX, NEEDS TDAP UP TO DATE ON COLONOSCOPY  Additional Screening:  Vision Screening: Recommended annual ophthalmology exams for early detection of glaucoma and other disorders of the eye.  Dental Screening: Recommended annual  dental exams for proper oral hygiene  Community Resource Referral / Chronic Care Management: CRR required this visit?  No   CCM required this visit?  No     Plan:     I have personally reviewed and noted the following in the patient's chart:   Medical and social history Use of alcohol, tobacco or illicit drugs  Current medications and supplements including opioid prescriptions. Patient is not currently taking opioid prescriptions. Functional ability and status Nutritional status Physical activity Advanced directives List of other physicians Hospitalizations, surgeries, and ER visits in previous 12 months Vitals Screenings to include cognitive, depression, and falls Referrals and appointments  In addition, I have reviewed and discussed with patient certain preventive protocols, quality metrics, and best practice recommendations. A written personalized care plan for preventive services as well as general preventive health recommendations were provided to patient.     Hal Hope, LPN   1/61/0960   After Visit Summary: (MyChart) Due to this being a telephonic visit, the after visit summary with patients personalized plan was offered to patient via MyChart   Notes: Nothing significant to report at this time.

## 2024-03-31 ENCOUNTER — Ambulatory Visit (INDEPENDENT_AMBULATORY_CARE_PROVIDER_SITE_OTHER): Payer: Self-pay | Admitting: Internal Medicine

## 2024-03-31 ENCOUNTER — Encounter: Payer: Self-pay | Admitting: Internal Medicine

## 2024-03-31 ENCOUNTER — Telehealth: Payer: Self-pay

## 2024-03-31 VITALS — BP 132/78 | HR 69 | Ht 75.0 in | Wt 304.2 lb

## 2024-03-31 DIAGNOSIS — I10 Essential (primary) hypertension: Secondary | ICD-10-CM

## 2024-03-31 DIAGNOSIS — Z0000 Encounter for general adult medical examination without abnormal findings: Secondary | ICD-10-CM

## 2024-03-31 DIAGNOSIS — E1169 Type 2 diabetes mellitus with other specified complication: Secondary | ICD-10-CM

## 2024-03-31 DIAGNOSIS — N401 Enlarged prostate with lower urinary tract symptoms: Secondary | ICD-10-CM | POA: Diagnosis not present

## 2024-03-31 DIAGNOSIS — N138 Other obstructive and reflux uropathy: Secondary | ICD-10-CM

## 2024-03-31 DIAGNOSIS — E118 Type 2 diabetes mellitus with unspecified complications: Secondary | ICD-10-CM

## 2024-03-31 DIAGNOSIS — Z7985 Long-term (current) use of injectable non-insulin antidiabetic drugs: Secondary | ICD-10-CM | POA: Diagnosis not present

## 2024-03-31 DIAGNOSIS — E785 Hyperlipidemia, unspecified: Secondary | ICD-10-CM

## 2024-03-31 MED ORDER — OZEMPIC (0.25 OR 0.5 MG/DOSE) 2 MG/3ML ~~LOC~~ SOPN
PEN_INJECTOR | SUBCUTANEOUS | 0 refills | Status: AC
Start: 2024-03-31 — End: 2024-05-12

## 2024-03-31 MED ORDER — SHINGRIX 50 MCG/0.5ML IM SUSR: 0.5000 mL | Freq: Once | INTRAMUSCULAR | 1 refills | Status: AC

## 2024-03-31 NOTE — Assessment & Plan Note (Addendum)
 Followed by Dr. Richardo Hanks - appointment tomorrow. On Flomax nightly. Will get PSA today

## 2024-03-31 NOTE — Assessment & Plan Note (Signed)
 BMi 38

## 2024-03-31 NOTE — Telephone Encounter (Signed)
 PA complete for Ozempic (0.25 or 0.5 MG/DOSE) 2MG /3ML pen-injectors on [email or web address]  (KeyDelbert Harness) Rx #: [id number]  Awaiting outcome.

## 2024-03-31 NOTE — Assessment & Plan Note (Signed)
 LDL is  Lab Results  Component Value Date   LDLCALC 77 03/14/2023   Current regimen is atorvastatin 10 mg.  No medication side effects noted. Goal LDL is <70.

## 2024-03-31 NOTE — Assessment & Plan Note (Signed)
 Blood pressure is well controlled.  Current medications lisinopril and hydrochlorothiazide. Will continue same regimen along with efforts to limit dietary sodium.

## 2024-03-31 NOTE — Assessment & Plan Note (Addendum)
 Blood sugars have been stable.  No recent hypoglycemic events requiring assistance. Currently medications are none - stopped ozempic due to cost - probably because cardiology rx'd it for CAD/obesity. Lab Results  Component Value Date   HGBA1C 6.5 (H) 11/20/2023  Will resume Ozempic - restart titration.  He will call if there is a coverage issue.

## 2024-03-31 NOTE — Progress Notes (Signed)
 Date:  03/31/2024   Name:  Benjamin Medina   DOB:  01-08-1951   MRN:  [id number]   Chief Complaint: Annual Exam Benjamin Medina is a 73 y.o. male who presents today for his Complete Annual Exam. He feels well. He reports exercising walks, 2 - 3 times a week, 20 minutes . He reports he is sleeping well.   Health Maintenance  Topic Date Due   Zoster (Shingles) Vaccine (1 of 2) 09/16/1971   DTaP/Tdap/Td vaccine (2 - Td or Tdap) 05/07/2021   Eye exam for diabetics  12/03/2022   COVID-19 Vaccine (6 - 2024-25 season) 08/31/2023   Yearly kidney health urinalysis for diabetes  03/13/2024   Hemoglobin A1C  05/19/2024   Flu Shot  07/30/2024   Yearly kidney function blood test for diabetes  11/19/2024   Medicare Annual Wellness Visit  03/24/2025   Complete foot exam   03/31/2025   Colon Cancer Screening  05/01/2026   Pneumonia Vaccine  Completed   Hepatitis C Screening  Completed   HPV Vaccine  Aged Out    Lab Results  Component Value Date   PSA1 1.8 03/14/2023   PSA1 1.3 03/28/2022   PSA1 1.6 03/26/2021   PSA 0.9 04/25/2015    Hypertension This is a chronic problem. The problem is controlled. Pertinent negatives include no chest pain, headaches, palpitations or shortness of breath. Past treatments include ACE inhibitors.  Hyperlipidemia This is a chronic problem. The problem is controlled. Recent lipid tests were reviewed and are normal. Pertinent negatives include no chest pain or shortness of breath. Current antihyperlipidemic treatment includes statins.  Diabetes He presents for his follow-up diabetic visit. He has type 2 diabetes mellitus. His disease course has been stable. Pertinent negatives for hypoglycemia include no dizziness or headaches. Pertinent negatives for diabetes include no chest pain, no fatigue and no weakness. Current diabetic treatment includes diet.    Review of Systems  Constitutional:  Negative for fatigue and unexpected weight change.  HENT:  Negative  for nosebleeds.   Eyes:  Negative for visual disturbance.  Respiratory:  Negative for cough, chest tightness, shortness of breath and wheezing.   Cardiovascular:  Negative for chest pain, palpitations and leg swelling.  Gastrointestinal:  Negative for abdominal pain, constipation and diarrhea.  Neurological:  Negative for dizziness, weakness, light-headedness and headaches.     Lab Results  Component Value Date   NA 138 11/20/2023   K 4.5 11/20/2023   CO2 19 (L) 11/20/2023   GLUCOSE 104 (H) 11/20/2023   BUN 13 11/20/2023   CREATININE 1.11 11/20/2023   CALCIUM 9.8 11/20/2023   EGFR 71 11/20/2023   GFRNONAA >60 11/19/2022   Lab Results  Component Value Date   CHOL 150 03/14/2023   HDL 39 (L) 03/14/2023   LDLCALC 77 03/14/2023   TRIG 202 (H) 03/14/2023   CHOLHDL 3.8 03/14/2023   Lab Results  Component Value Date   TSH 1.040 07/09/2022   Lab Results  Component Value Date   HGBA1C 6.5 (H) 11/20/2023   Lab Results  Component Value Date   WBC 8.8 03/14/2023   HGB 14.5 03/14/2023   HCT 43.9 03/14/2023   MCV 93 03/14/2023   PLT 356 03/14/2023   Lab Results  Component Value Date   ALT 32 03/14/2023   AST 17 03/14/2023   ALKPHOS 97 03/14/2023   BILITOT <0.2 03/14/2023   No results found for: "25OHVITD2", "25OHVITD3", "VD25OH"   Patient Active Problem List  Diagnosis Date Noted   Lymphedema 08/09/2022   Hx of hepatitis C 01/10/2020   Obesity, morbid (HCC) 03/24/2019   Hyperlipidemia associated with type 2 diabetes mellitus (HCC) 03/24/2019   DM type 2, controlled, with complication (HCC) 06/12/2018   Essential hypertension 06/02/2018   Auditory impairment 11/04/2015   Benign prostatic hyperplasia with urinary obstruction 11/04/2015   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   Generalized psoriasis 11/04/2015   Periodic limb movement 11/04/2015    Not on File  Past Surgical History:  Procedure Laterality Date   BLEPHAROPLASTY Bilateral 12/30/2014    COLONOSCOPY WITH PROPOFOL N/A 05/01/2016   Procedure: COLONOSCOPY WITH PROPOFOL;  Surgeon: Earline Mayotte, MD;  Location: [facility] ENDOSCOPY;  Service: Endoscopy;  Laterality: N/A;   KNEE ARTHROSCOPY Left 01/14/2019   Procedure: ARTHROSCOPY KNEE WITH DEBRIDEMENT AND PARTIAL MEDIAL MENISCECTOMY;  Surgeon: Christena Flake, MD;  Location: [facility] ORS;  Service: Orthopedics;  Laterality: Left;   KNEE ARTHROSCOPY WITH MEDIAL MENISECTOMY Right 11/27/2016   Procedure: KNEE ARTHROSCOPY WITH MEDIAL MENISECTOMY;  Surgeon: Christena Flake, MD;  Location: 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  Current Meds  Medication Sig   atorvastatin (LIPITOR) 10 MG tablet TAKE 1 TABLET(10 MG) BY MOUTH DAILY   clobetasol (TEMOVATE) 0.05 % GEL Apply 1 application topically 2 (two) times daily.   hydrochlorothiazide (MICROZIDE) 12.5 MG capsule TAKE 1 CAPSULE(12.5 MG) BY MOUTH DAILY   lisinopril (ZESTRIL) 40 MG tablet TAKE 1 TABLET BY MOUTH EVERY DAY   Semaglutide,0.25 or 0.5MG /DOS, (OZEMPIC, 0.25 OR 0.5 MG/DOSE,) 2 MG/3ML SOPN Inject 0.25 mg into the skin once a week for 28 days, THEN 0.5 mg once a week for 14 days.   terazosin (HYTRIN) 1 MG capsule TAKE 1 CAPSULE(1 MG) BY MOUTH AT BEDTIME   triamcinolone (KENALOG) 0.025 %  cream Apply topically as needed.   Zoster Vaccine Adjuvanted [facility]) injection Inject 0.5 mLs into the muscle once for 1 dose.       03/31/2024    9:37 AM 11/20/2023    9:56 AM 07/15/2023   10:08 AM 03/14/2023   11:09 AM  GAD 7 : Generalized Anxiety Score  Nervous, Anxious, on Edge 0 0 0 0  Control/stop worrying 0 0 0 0  Worry too much - different things 0 0 0 0  Trouble relaxing 0 0 0 0  Restless 0 0 0 0  Easily annoyed or irritable 0 0 0 1  Afraid - awful might happen 0 0 0 0  Total GAD 7 Score 0 0 0 1  Anxiety Difficulty Not difficult at all Not difficult at all Not difficult at all Not difficult at  all       03/31/2024    9:37 AM 03/24/2024   10:16 AM 11/20/2023    9:55 AM  Depression screen PHQ 2/9  Decreased Interest 0 0 0  Down, Depressed, Hopeless 0 0 0  PHQ - 2 Score 0 0 0  Altered sleeping 0 0 0  Tired, decreased energy 1 0 3  Change in appetite 0 0 0  Feeling bad or failure about yourself  0 0 0  Trouble concentrating 0 0 0  Moving slowly or fidgety/restless 0 0 0  Suicidal thoughts 0 0 0  PHQ-9 Score 1 0 3  Difficult doing work/chores Not difficult at all Not difficult at all Not difficult at all    BP Readings from Last 3 Encounters:  03/31/24 132/78  01/20/24 134/85  12/08/23 (!) 102/56    Physical Exam Vitals and nursing note reviewed.  Constitutional:      Appearance: Normal appearance. He is well-developed.  HENT:     Head: Normocephalic.     Right Ear: Tympanic membrane, ear canal and external ear normal.     Left Ear: Tympanic membrane, ear canal and external ear normal.     Nose: Nose normal.  Eyes:     Conjunctiva/sclera: Conjunctivae normal.     Pupils: Pupils are equal, round, and reactive to light.  Neck:     Thyroid: No thyromegaly.     Vascular: No carotid bruit.  Cardiovascular:     Rate and Rhythm: Normal rate and regular rhythm.     Heart sounds: Normal heart sounds.  Pulmonary:     Effort: Pulmonary effort is normal.      Breath sounds: Normal breath sounds. No wheezing.  Chest:  Breasts:    Right: No mass.     Left: No mass.  Abdominal:     General: Bowel sounds are normal.     Palpations: Abdomen is soft.     Tenderness: There is no abdominal tenderness.  Musculoskeletal:        General: Normal range of motion.     Cervical back: Normal range of motion and neck supple.     Right lower leg: No edema.     Left lower leg: No edema.  Lymphadenopathy:     Cervical: No cervical adenopathy.  Skin:    General: Skin is warm and dry.     Capillary Refill: Capillary refill takes less than 2 seconds.  Neurological:     General: No focal deficit present.     Mental Status: He is alert and oriented to person, place, and time.     Deep Tendon Reflexes: Reflexes are normal and symmetric.  Psychiatric:        Attention and Perception: Attention normal.        Mood and Affect: Mood normal.        Thought Content: Thought content normal.    Diabetic Foot Exam - Simple   Simple Foot Form Diabetic Foot exam was performed with the following findings: Yes 03/31/2024  9:54 AM  Visual Inspection No deformities, no ulcerations, no other skin breakdown bilaterally: Yes Sensation Testing Intact to touch and monofilament testing bilaterally: Yes Pulse Check Posterior Tibialis and Dorsalis pulse intact bilaterally: Yes Comments      Wt Readings from Last 3 Encounters:  03/31/24 (!) 304 lb 4 oz (138 kg)  01/20/24 (!) 306 lb 3.2 oz (138.9 kg)  12/08/23 296 lb (134.3 kg)    BP 132/78   Pulse 69  Ht 6\' 3"  (1.905 m)   Wt (!) 304 lb 4 oz (138 kg)   SpO2 100%   BMI 38.03 kg/m   Assessment and Plan:  Problem List Items Addressed This Visit       Unprioritized   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 Richardo Hanks - appointment tomorrow. On Flomax nightly. Will get PSA today      Relevant Orders   PSA   Essential hypertension (Chronic)   Blood pressure is well  controlled.  Current medications lisinopril and hydrochlorothiazide. Will continue same regimen along with efforts to limit dietary sodium.       Relevant Orders   CBC with Differential/Platelet   Comprehensive metabolic panel with GFR   DM type 2, controlled, with complication (HCC) (Chronic)   Blood sugars have been stable.  No recent hypoglycemic events requiring assistance. Currently medications are none - stopped ozempic due to cost - probably because cardiology rx'd it for CAD/obesity. Lab Results  Component Value Date   HGBA1C 6.5 (H) 11/20/2023  Will resume Ozempic - restart titration.  He will call if there is a coverage issue.       Relevant Medications   Semaglutide,0.25 or 0.5MG /DOS, (OZEMPIC, 0.25 OR 0.5 MG/DOSE,) 2 MG/3ML SOPN   Other Relevant Orders   Comprehensive metabolic panel with GFR   Hemoglobin A1c   Microalbumin / creatinine urine ratio   Obesity, morbid (HCC)   BMi 38      Relevant Medications   Semaglutide,0.25 or 0.5MG /DOS, (OZEMPIC, 0.25 OR 0.5 MG/DOSE,) 2 MG/3ML SOPN   Hyperlipidemia associated with type 2 diabetes mellitus (HCC) (Chronic)   LDL is  Lab Results  Component Value Date   LDLCALC 77 03/14/2023   Current regimen is atorvastatin 10 mg.  No medication side effects noted. Goal LDL is <70.       Relevant Medications   Semaglutide,0.25 or 0.5MG /DOS, (OZEMPIC, 0.25 OR 0.5 MG/DOSE,) 2 MG/3ML SOPN   Other Relevant Orders   Lipid panel   Other Visit Diagnoses       Annual physical exam    -  Primary     Long-term current use of injectable noninsulin antidiabetic medication           Return in about 4 months (around 07/31/2024) for DM.    Reubin Milan, MD [facility] [place]

## 2024-04-01 ENCOUNTER — Ambulatory Visit (INDEPENDENT_AMBULATORY_CARE_PROVIDER_SITE_OTHER): Payer: Self-pay | Admitting: Urology

## 2024-04-01 VITALS — BP 152/86 | HR 67 | Ht 76.0 in | Wt 304.0 lb

## 2024-04-01 DIAGNOSIS — Z792 Long term (current) use of antibiotics: Secondary | ICD-10-CM | POA: Diagnosis not present

## 2024-04-01 DIAGNOSIS — N401 Enlarged prostate with lower urinary tract symptoms: Secondary | ICD-10-CM | POA: Diagnosis not present

## 2024-04-01 LAB — MICROALBUMIN / CREATININE URINE RATIO
Creatinine, Urine: 131.7 mg/dL
Microalb/Creat Ratio: 78 mg/g{creat} — ABNORMAL HIGH (ref 0–29)
Microalbumin, Urine: 103.2 ug/mL

## 2024-04-01 LAB — CBC WITH DIFFERENTIAL/PLATELET
Basophils Absolute: 0.1 10*3/uL (ref 0.0–0.2)
Basos: 1 %
EOS (ABSOLUTE): 0.4 10*3/uL (ref 0.0–0.4)
Eos: 5 %
Hematocrit: 46.2 % (ref 37.5–51.0)
Hemoglobin: 15.3 g/dL (ref 13.0–17.7)
Immature Grans (Abs): 0 10*3/uL (ref 0.0–0.1)
Immature Granulocytes: 0 %
Lymphocytes Absolute: 1.7 10*3/uL (ref 0.7–3.1)
Lymphs: 24 %
MCH: 31 pg (ref 26.6–33.0)
MCHC: 33.1 g/dL (ref 31.5–35.7)
MCV: 94 fL (ref 79–97)
Monocytes Absolute: 0.6 10*3/uL (ref 0.1–0.9)
Monocytes: 9 %
Neutrophils Absolute: 4.4 10*3/uL (ref 1.4–7.0)
Neutrophils: 61 %
Platelets: 262 10*3/uL (ref 150–450)
RBC: 4.93 x10E6/uL (ref 4.14–5.80)
RDW: 12.8 % (ref 11.6–15.4)
WBC: 7.2 10*3/uL (ref 3.4–10.8)

## 2024-04-01 LAB — COMPREHENSIVE METABOLIC PANEL WITH GFR
ALT: 22 IU/L (ref 0–44)
AST: 17 IU/L (ref 0–40)
Albumin: 4.6 g/dL (ref 3.8–4.8)
Alkaline Phosphatase: 75 IU/L (ref 44–121)
BUN/Creatinine Ratio: 13 (ref 10–24)
BUN: 12 mg/dL (ref 8–27)
Bilirubin Total: 0.4 mg/dL (ref 0.0–1.2)
CO2: 22 mmol/L (ref 20–29)
Calcium: 9.8 mg/dL (ref 8.6–10.2)
Chloride: 104 mmol/L (ref 96–106)
Creatinine, Ser: 0.96 mg/dL (ref 0.76–1.27)
Globulin, Total: 2.8 g/dL (ref 1.5–4.5)
Glucose: 110 mg/dL — ABNORMAL HIGH (ref 70–99)
Potassium: 4.9 mmol/L (ref 3.5–5.2)
Sodium: 141 mmol/L (ref 134–144)
Total Protein: 7.4 g/dL (ref 6.0–8.5)
eGFR: 85 mL/min/{1.73_m2} (ref >59)

## 2024-04-01 LAB — LIPID PANEL
Chol/HDL Ratio: 4.2 ratio (ref 0.0–5.0)
Cholesterol, Total: 188 mg/dL (ref 100–199)
HDL: 45 mg/dL (ref >39)
LDL Chol Calc (NIH): 121 mg/dL — ABNORMAL HIGH (ref 0–99)
Triglycerides: 125 mg/dL (ref 0–149)
VLDL Cholesterol Cal: 22 mg/dL (ref 5–40)

## 2024-04-01 LAB — PSA: Prostate Specific Ag, Serum: 1.6 ng/mL (ref 0.0–4.0)

## 2024-04-01 LAB — HEMOGLOBIN A1C
Est. average glucose Bld gHb Est-mCnc: 134 mg/dL
Hgb A1c MFr Bld: 6.3 % — ABNORMAL HIGH (ref 4.8–5.6)

## 2024-04-01 MED ORDER — CEPHALEXIN 250 MG PO CAPS
500.0000 mg | ORAL_CAPSULE | Freq: Once | ORAL | Status: AC
  Administered 2024-04-01: 500 mg via ORAL

## 2024-04-01 NOTE — Progress Notes (Signed)
 Cystoscopy Procedure Note:  Indication: Obstructive urinary symptoms, weak stream, dribbling, frequency/urgency, nocturia, elevated PVRs >239ml  After informed consent and discussion of the procedure and its risks, Benjamin Medina was positioned and prepped in the standard fashion. Cystoscopy was performed with a flexible cystoscope. The urethra, bladder neck and entire bladder was visualized in a standard fashion. The prostate was large with obstructing lateral lobes and a massive intravesical median lobe. The ureteral orifices were visualized in their normal location and orientation.  Moderate bladder trabeculations.  Massive intravesical median lobe on retroflexion  Imaging: The transrectal ultrasound device was inserted for a calculated prostate volume of 80 g, massive median lobe  Findings: Enlarged prostate with massive median lobe, volume 80 g plus median lobe  ---------------------------------------------------------------------------------------  Assessment and Plan: 73 year old male with obstructive urinary symptoms, weak stream, dribbling, urgency/frequency, nocturia, and elevated PVRs greater than 250 mL despite terazosin.  PSA normal.  Evaluation today shows significantly enlarged prostate with massive intravesical median lobe.  I think he will get the most benefit from outlet procedure, specifically HOLEP based on prostate volume.  We discussed the risks and benefits of HoLEP at length.  The procedure requires general anesthesia and takes 1 to 2 hours, and a holmium laser is used to enucleate the prostate and push this tissue into the bladder.  A morcellator is then used to remove this tissue, which is sent for pathology.  The vast majority(>95%) of patients are able to discharge the same day with a catheter in place for 2 to 3 days, and will follow-up in clinic for a voiding trial.  We specifically discussed the risks of bleeding, infection, retrograde ejaculation, temporary urgency  and urge incontinence, very low risk of long-term incontinence, urethral stricture/bladder neck contracture, pathologic evaluation of prostate tissue and possible detection of prostate cancer or other malignancy, and possible need for additional procedures.  Schedule HOLEP  Legrand Rams, MD 04/01/2024

## 2024-04-01 NOTE — Telephone Encounter (Signed)
 [place]  APPROVED.   Authorization Expiration Date: March 31, 2025.

## 2024-04-01 NOTE — Patient Instructions (Signed)

## 2024-04-02 ENCOUNTER — Encounter: Payer: Self-pay | Admitting: Internal Medicine

## 2024-04-06 ENCOUNTER — Encounter: Payer: Self-pay | Admitting: Internal Medicine

## 2024-04-09 NOTE — Telephone Encounter (Signed)
 Please review.  KP

## 2024-04-09 NOTE — Telephone Encounter (Signed)
 Pt response.  KP

## 2024-04-14 ENCOUNTER — Telehealth: Payer: Self-pay

## 2024-04-14 ENCOUNTER — Other Ambulatory Visit: Payer: Self-pay | Admitting: Internal Medicine

## 2024-04-14 ENCOUNTER — Telehealth: Admitting: Internal Medicine

## 2024-04-14 VITALS — Temp 99.8°F

## 2024-04-14 DIAGNOSIS — U071 COVID-19: Secondary | ICD-10-CM

## 2024-04-14 HISTORY — DX: COVID-19: U07.1

## 2024-04-14 MED ORDER — NIRMATRELVIR/RITONAVIR (PAXLOVID)TABLET
3.0000 | ORAL_TABLET | Freq: Two times a day (BID) | ORAL | 0 refills | Status: AC
Start: 2024-04-14 — End: 2024-04-19

## 2024-04-14 MED ORDER — PROMETHAZINE-DM 6.25-15 MG/5ML PO SYRP
5.0000 mL | ORAL_SOLUTION | Freq: Four times a day (QID) | ORAL | 0 refills | Status: AC | PRN
Start: 2024-04-14 — End: 2024-04-23

## 2024-04-14 NOTE — Patient Instructions (Signed)
Take Tylenol 650 - 1000 mg every 6-8 hours for fever, body aches and headache. Drink plenty of fluids with electrolytes. Monitor for fever that does not decrease and/or shortness of breath that worsens or is present at rest. Quarantine for 5 days.

## 2024-04-14 NOTE — Telephone Encounter (Signed)
 Copied from CRM [id number]. Topic: Clinical - Medication Question >> Apr 14, 2024 12:55 PM Everlene Hobby D wrote: Patient would like to know if he can get the cough medicine to the pharmacy where the pcp sent his COVID medicine.

## 2024-04-14 NOTE — Telephone Encounter (Signed)
 Please review

## 2024-04-14 NOTE — Progress Notes (Signed)
 Date:  04/14/2024   Name:  Benjamin Medina   DOB:  September 17, 1951   MRN:  [id number]  This encounter was conducted via video encounter. This platform was deemed appropriate for the issues to be addressed.  The patient was correctly identified.  I advised that I am conducting the visit from a secure room in my office at [facility] clinic.  The patient is located at his vacation rental at [place], [place]. The limitations of this form of encounter were discussed with the patient and he/she agreed to proceed.  Some vital signs will be absent.  Chief Complaint: Covid Positive (Test this morning, started to fell bad yesterday, cough, fever, fatigue)  Cough This is a new problem. The current episode started yesterday. The cough is Non-productive. Associated symptoms include chills, a fever (101.1) and headaches. Pertinent negatives include no chest pain.    Review of Systems  Constitutional:  Positive for chills and fever (101.1).  Respiratory:  Positive for cough.   Cardiovascular:  Negative for chest pain and palpitations.  Neurological:  Positive for headaches.     Lab Results  Component Value Date   NA 141 03/31/2024   K 4.9 03/31/2024   CO2 22 03/31/2024   GLUCOSE 110 (H) 03/31/2024   BUN 12 03/31/2024   CREATININE 0.96 03/31/2024   CALCIUM 9.8 03/31/2024   EGFR 85 03/31/2024   GFRNONAA >60 11/19/2022   Lab Results  Component Value Date   CHOL 188 03/31/2024   HDL 45 03/31/2024   LDLCALC 121 (H) 03/31/2024   TRIG 125 03/31/2024   CHOLHDL 4.2 03/31/2024   Lab Results  Component Value Date   TSH 1.040 07/09/2022   Lab Results  Component Value Date   HGBA1C 6.3 (H) 03/31/2024   Lab Results  Component Value Date   WBC 7.2 03/31/2024   HGB 15.3 03/31/2024   HCT 46.2 03/31/2024   MCV 94 03/31/2024   PLT 262 03/31/2024   Lab Results  Component Value Date   ALT 22 03/31/2024   AST 17 03/31/2024   ALKPHOS 75 03/31/2024   BILITOT 0.4 03/31/2024   No results  found for: "25OHVITD2", "25OHVITD3", "VD25OH"   Patient Active Problem List   Diagnosis Date Noted   COVID-19 virus infection 04/14/2024   Lymphedema 08/09/2022   Hx of hepatitis C 01/10/2020   Obesity, morbid (HCC) 03/24/2019   Hyperlipidemia associated with type 2 diabetes mellitus (HCC) 03/24/2019   DM type 2, controlled, with complication (HCC) 06/12/2018   Essential hypertension 06/02/2018   Auditory impairment 11/04/2015   Benign prostatic hyperplasia with urinary obstruction 11/04/2015   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   Generalized psoriasis 11/04/2015   Periodic limb movement 11/04/2015    No Known Allergies  Past Surgical History:  Procedure Laterality Date   BLEPHAROPLASTY Bilateral 12/30/2014   COLONOSCOPY WITH PROPOFOL N/A 05/01/2016   Procedure: COLONOSCOPY WITH PROPOFOL;  Surgeon: Earline Mayotte, MD;  Location: [facility] ENDOSCOPY;  Service: Endoscopy;  Laterality: N/A;   KNEE ARTHROSCOPY Left 01/14/2019   Procedure: ARTHROSCOPY KNEE WITH DEBRIDEMENT AND PARTIAL MEDIAL MENISCECTOMY;  Surgeon: Christena Flake, MD;  Location: [facility] ORS;  Service: Orthopedics;  Laterality: Left;   KNEE ARTHROSCOPY WITH MEDIAL MENISECTOMY Right 11/27/2016   Procedure: KNEE ARTHROSCOPY WITH MEDIAL MENISECTOMY;  Surgeon: Christena Flake, MD;  Location: [facility];  Service: Orthopedics;  Laterality: Right;   SINUS SURGERY WITH INSTATRAK     TOTAL KNEE ARTHROPLASTY Right 11/28/2022  Procedure: TOTAL KNEE ARTHROPLASTY;  Surgeon: Christena Flake, MD;  Location: [facility] ORS;  Service: Orthopedics;  Laterality: Right;   VASECTOMY      Social History   Tobacco Use   Smoking status: Former    Current packs/day: 0.00    Types: Cigarettes    Quit date: 12/30/1985    Years since quitting: 38.3   Smokeless tobacco: Never  Vaping Use   Vaping status: Never Used  Substance Use Topics   Alcohol use: Yes    Alcohol/week: 4.0 standard drinks of alcohol    Types: 4 Standard drinks or  equivalent per week    Comment: social   Drug use: No     Medication list has been reviewed and updated.  Current Meds  Medication Sig   atorvastatin (LIPITOR) 10 MG tablet TAKE 1 TABLET(10 MG) BY MOUTH DAILY   clobetasol (TEMOVATE) 0.05 % GEL Apply 1 application topically 2 (two) times daily.   hydrochlorothiazide (MICROZIDE) 12.5 MG capsule TAKE 1 CAPSULE(12.5 MG) BY MOUTH DAILY   lisinopril (ZESTRIL) 40 MG tablet TAKE 1 TABLET BY MOUTH EVERY DAY   nirmatrelvir/ritonavir (PAXLOVID) 20 x 150 MG & 10 x 100MG  TABS Take 3 tablets by mouth 2 (two) times daily for 5 days. (Take nirmatrelvir 150 mg two tablets twice daily for 5 days and ritonavir 100 mg one tablet twice daily for 5 days) Patient GFR is 86.   Semaglutide,0.25 or 0.5MG /DOS, (OZEMPIC, 0.25 OR 0.5 MG/DOSE,) 2 MG/3ML SOPN Inject 0.25 mg into the skin once a week for 28 days, THEN 0.5 mg once a week for 14 days.   terazosin (HYTRIN) 1 MG capsule TAKE 1 CAPSULE(1 MG) BY MOUTH AT BEDTIME   triamcinolone (KENALOG) 0.025 % cream Apply topically as needed.       03/31/2024    9:37 AM 11/20/2023    9:56 AM 07/15/2023   10:08 AM 03/14/2023   11:09 AM  GAD 7 : Generalized Anxiety Score  Nervous, Anxious, on Edge 0 0 0 0  Control/stop worrying 0 0 0 0  Worry too much - different things 0 0 0 0  Trouble relaxing 0 0 0 0  Restless 0 0 0 0  Easily annoyed or irritable 0 0 0 1  Afraid - awful might happen 0 0 0 0  Total GAD 7 Score 0 0 0 1  Anxiety Difficulty Not difficult at all Not difficult at all Not difficult at all Not difficult at all       03/31/2024    9:37 AM 03/24/2024   10:16 AM 11/20/2023    9:55 AM  Depression screen PHQ 2/9  Decreased Interest 0 0 0  Down, Depressed, Hopeless 0 0 0  PHQ - 2 Score 0 0 0  Altered sleeping 0 0 0  Tired, decreased energy 1 0 3  Change in appetite 0 0 0  Feeling bad or failure about yourself  0 0 0  Trouble concentrating 0 0 0  Moving slowly or fidgety/restless 0 0 0  Suicidal  thoughts 0 0 0  PHQ-9 Score 1 0 3  Difficult doing work/chores Not difficult at all Not difficult at all Not difficult at all    BP Readings from Last 3 Encounters:  04/01/24 (!) 152/86  03/31/24 132/78  01/20/24 134/85    Physical Exam Constitutional:      General: He is not in acute distress.    Appearance: Normal appearance.  Pulmonary:     Effort: Pulmonary effort is  normal.  Neurological:     Mental Status: He is alert.  Psychiatric:        Mood and Affect: Mood normal.     Wt Readings from Last 3 Encounters:  04/01/24 (!) 304 lb (137.9 kg)  03/31/24 (!) 304 lb 4 oz (138 kg)  01/20/24 (!) 306 lb 3.2 oz (138.9 kg)    Temp 99.8 F (37.7 C)   Assessment and Plan:  Problem List Items Addressed This Visit       Unprioritized   COVID-19 virus infection - Primary   Take Tylenol 650 - 1000 mg every 6-8 hours for fever, body aches and headache. Drink plenty of fluids with electrolytes. Monitor for fever that does not decrease and/or shortness of breath that worsens or is present at rest. Quarantine for 5 days.       Relevant Medications   nirmatrelvir/ritonavir (PAXLOVID) 20 x 150 MG & 10 x 100MG  TABS   I spent 9 minutes on this encounter, 100% via video.  No follow-ups on file.    Sheron Dixons, MD [facility] [place]

## 2024-04-14 NOTE — Assessment & Plan Note (Signed)
Take Tylenol 650 - 1000 mg every 6-8 hours for fever, body aches and headache. Drink plenty of fluids with electrolytes. Monitor for fever that does not decrease and/or shortness of breath that worsens or is present at rest. Quarantine for 5 days.

## 2024-05-14 ENCOUNTER — Ambulatory Visit (INDEPENDENT_AMBULATORY_CARE_PROVIDER_SITE_OTHER): Admitting: Internal Medicine

## 2024-05-14 ENCOUNTER — Ambulatory Visit: Payer: Self-pay | Admitting: Internal Medicine

## 2024-05-14 ENCOUNTER — Ambulatory Visit
Admission: RE | Admit: 2024-05-14 | Discharge: 2024-05-14 | Disposition: A | Discharge status: Home / Self Care | Attending: Internal Medicine | Admitting: Internal Medicine

## 2024-05-14 ENCOUNTER — Ambulatory Visit
Admission: RE | Admit: 2024-05-14 | Discharge: 2024-05-14 | Disposition: A | Discharge status: Home / Self Care | Source: Ambulatory Visit | Attending: Internal Medicine | Admitting: Internal Medicine

## 2024-05-14 ENCOUNTER — Ambulatory Visit: Payer: Self-pay

## 2024-05-14 ENCOUNTER — Encounter: Payer: Self-pay | Admitting: Internal Medicine

## 2024-05-14 VITALS — BP 118/64 | HR 86 | Temp 97.5°F | Ht 76.0 in | Wt 304.0 lb

## 2024-05-14 DIAGNOSIS — R0609 Other forms of dyspnea: Secondary | ICD-10-CM

## 2024-05-14 NOTE — Telephone Encounter (Signed)
Patient was seen in office. No further action needed.

## 2024-05-14 NOTE — Progress Notes (Signed)
 Date:  05/14/2024   Name:  Benjamin Medina   DOB:  1951-04-28   MRN:  [id number]   Chief Complaint: Shortness of Breath (Patient has noticed that after getting COVID on 4/16 he has been short of breath with walking, doing yard work, comes and goes )  Shortness of Breath This is a new problem. The current episode started 1 to 4 weeks ago. The problem occurs constantly. The problem has been unchanged. Pertinent negatives include no abdominal pain, chest pain, fever, headaches, vomiting or wheezing.    Review of Systems  Constitutional:  Negative for chills, fatigue and fever.  Respiratory:  Positive for cough and shortness of breath. Negative for choking and wheezing.   Cardiovascular:  Negative for chest pain and palpitations.  Gastrointestinal:  Negative for abdominal pain and vomiting.  Neurological:  Negative for dizziness, light-headedness and headaches.  Psychiatric/Behavioral:  Negative for dysphoric mood and sleep disturbance. The patient is not nervous/anxious.      Lab Results  Component Value Date   NA 141 03/31/2024   K 4.9 03/31/2024   CO2 22 03/31/2024   GLUCOSE 110 (H) 03/31/2024   BUN 12 03/31/2024   CREATININE 0.96 03/31/2024   CALCIUM  9.8 03/31/2024   EGFR 85 03/31/2024   GFRNONAA >60 11/19/2022   Lab Results  Component Value Date   CHOL 188 03/31/2024   HDL 45 03/31/2024   LDLCALC 121 (H) 03/31/2024   TRIG 125 03/31/2024   CHOLHDL 4.2 03/31/2024   Lab Results  Component Value Date   TSH 1.040 07/09/2022   Lab Results  Component Value Date   HGBA1C 6.3 (H) 03/31/2024   Lab Results  Component Value Date   WBC 7.2 03/31/2024   HGB 15.3 03/31/2024   HCT 46.2 03/31/2024   MCV 94 03/31/2024   PLT 262 03/31/2024   Lab Results  Component Value Date   ALT 22 03/31/2024   AST 17 03/31/2024   ALKPHOS 75 03/31/2024   BILITOT 0.4 03/31/2024   No results found for: "25OHVITD2", "25OHVITD3", "VD25OH"   Patient Active Problem List   Diagnosis  Date Noted   COVID-19 virus infection 04/14/2024   Lymphedema 08/09/2022   Hx of hepatitis C 01/10/2020   Obesity, morbid (HCC) 03/24/2019   Hyperlipidemia associated with type 2 diabetes mellitus (HCC) 03/24/2019   DM type 2, controlled, with complication (HCC) 06/12/2018   Essential hypertension 06/02/2018   Auditory impairment 11/04/2015   Benign prostatic hyperplasia with urinary obstruction 11/04/2015   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   Generalized psoriasis 11/04/2015   Periodic limb movement 11/04/2015    No Known Allergies  Past Surgical History:  Procedure Laterality Date   BLEPHAROPLASTY Bilateral 12/30/2014   COLONOSCOPY WITH PROPOFOL  N/A 05/01/2016   Procedure: COLONOSCOPY WITH PROPOFOL ;  Surgeon: Marshall Skeeter, MD;  Location: [facility];  Service: Endoscopy;  Laterality: N/A;   KNEE ARTHROSCOPY Left 01/14/2019   Procedure: ARTHROSCOPY KNEE WITH DEBRIDEMENT AND PARTIAL MEDIAL MENISCECTOMY;  Surgeon: Elner Hahn, MD;  Location: [facility];  Service: Orthopedics;  Laterality: Left;   KNEE ARTHROSCOPY WITH MEDIAL MENISECTOMY Right 11/27/2016   Procedure: KNEE ARTHROSCOPY WITH MEDIAL MENISECTOMY;  Surgeon: Elner Hahn, MD;  Location: [facility];  Service: Orthopedics;  Laterality: Right;   SINUS SURGERY WITH INSTATRAK     TOTAL KNEE ARTHROPLASTY Right 11/28/2022   Procedure: TOTAL KNEE ARTHROPLASTY;  Surgeon: Elner Hahn, MD;  Location: [facility];  Service: Orthopedics;  Laterality: Right;  VASECTOMY      Social History   Tobacco Use   Smoking status: Former    Current packs/day: 0.00    Types: Cigarettes    Quit date: 12/30/1985    Years since quitting: 38.3   Smokeless tobacco: Never  Vaping Use   Vaping status: Never Used  Substance Use Topics   Alcohol use: Yes    Alcohol/week: 4.0 standard drinks of alcohol    Types: 4 Standard drinks or equivalent per week    Comment: social   Drug use: No     Medication list has been  reviewed and updated.  Current Meds  Medication Sig   atorvastatin  (LIPITOR) 10 MG tablet TAKE 1 TABLET(10 MG) BY MOUTH DAILY   clobetasol  (TEMOVATE ) 0.05 % GEL Apply 1 application topically 2 (two) times daily.   hydrochlorothiazide  (MICROZIDE ) 12.5 MG capsule TAKE 1 CAPSULE(12.5 MG) BY MOUTH DAILY   lisinopril  (ZESTRIL ) 40 MG tablet TAKE 1 TABLET BY MOUTH EVERY DAY   terazosin  (HYTRIN ) 1 MG capsule TAKE 1 CAPSULE(1 MG) BY MOUTH AT BEDTIME   triamcinolone  (KENALOG ) 0.025 % cream Apply topically as needed.       05/14/2024   10:45 AM 03/31/2024    9:37 AM 11/20/2023    9:56 AM 07/15/2023   10:08 AM  GAD 7 : Generalized Anxiety Score  Nervous, Anxious, on Edge 0 0 0 0  Control/stop worrying 0 0 0 0  Worry too much - different things  0 0 0  Trouble relaxing  0 0 0  Restless  0 0 0  Easily annoyed or irritable  0 0 0  Afraid - awful might happen  0 0 0  Total GAD 7 Score  0 0 0  Anxiety Difficulty  Not difficult at all Not difficult at all Not difficult at all       05/14/2024   10:45 AM 03/31/2024    9:37 AM 03/24/2024   10:16 AM  Depression screen PHQ 2/9  Decreased Interest 0 0 0  Down, Depressed, Hopeless 0 0 0  PHQ - 2 Score 0 0 0  Altered sleeping  0 0  Tired, decreased energy  1 0  Change in appetite  0 0  Feeling bad or failure about yourself   0 0  Trouble concentrating  0 0  Moving slowly or fidgety/restless  0 0  Suicidal thoughts  0 0  PHQ-9 Score  1 0  Difficult doing work/chores  Not difficult at all Not difficult at all    BP Readings from Last 3 Encounters:  05/14/24 118/64  04/01/24 (!) 152/86  03/31/24 132/78    Physical Exam Vitals and nursing note reviewed.  Constitutional:      General: He is not in acute distress.    Appearance: He is well-developed. He is not ill-appearing.  HENT:     Head: Normocephalic and atraumatic.  Cardiovascular:     Rate and Rhythm: Normal rate and regular rhythm.  Pulmonary:     Effort: Pulmonary effort is  normal. No respiratory distress.     Breath sounds: Normal breath sounds. No decreased breath sounds, wheezing or rhonchi.  Skin:    General: Skin is warm and dry.     Findings: No rash.  Neurological:     Mental Status: He is alert and oriented to person, place, and time.  Psychiatric:        Mood and Affect: Mood normal.        Behavior: Behavior  normal.     Wt Readings from Last 3 Encounters:  05/14/24 (!) 304 lb (137.9 kg)  04/01/24 (!) 304 lb (137.9 kg)  03/31/24 (!) 304 lb 4 oz (138 kg)    BP 118/64   Pulse 86   Temp (!) 97.5 F (36.4 C)   Ht 6\' 4"  (1.93 m)   Wt (!) 304 lb (137.9 kg)   SpO2 97%   BMI 37.00 kg/m   Assessment and Plan:  Problem List Items Addressed This Visit   None Visit Diagnoses       DOE (dyspnea on exertion)    -  Primary   since Covid last month - VSS stable, O2 acceptable will obtain CXR sample of Breztri 1 puff bid x 14 days   Relevant Orders   DG Chest 2 View (Completed)       No follow-ups on file.    Sheron Dixons, MD [facility] [place]

## 2024-05-14 NOTE — Telephone Encounter (Signed)
 Copied from CRM [id number]. Topic: Clinical - Red Word Triage >> May 14, 2024  9:58 AM Loreda Rodriguez T wrote: Red Word that prompted transfer to Nurse Triage: patient was diagnosed with Covid 3 weeks ago and no longer have symptoms he is just having shortness of breath.   Chief Complaint: Shortness of breath  Symptoms: Difficulty breathing, intermittent headaches  Frequency: Intermittent  Pertinent Negatives: Patient denies any other symptoms at this time  Disposition: [] ED /[] Urgent Care (no appt availability in office) / [x] Appointment(In office/virtual)/ []  [facility]/ [] Home Care/ [] Refused Recommended Disposition /[] [facility]/ []  Follow-up with PCP Additional Notes: Patient reports that he was diagnosed with COVID 3 weeks ago and is still having intermittent difficulty breathing. He states he has also been experiencing intermittent headaches. He denies any other symptoms. Appointment made for the patient today for evaluation.     Reason for Disposition  [1] MILD difficulty breathing (e.g., minimal/no SOB at rest, SOB with walking, pulse <100) AND [2] NEW-onset or WORSE than normal  Answer Assessment - Initial Assessment Questions 1. RESPIRATORY STATUS: "Describe your breathing?" (e.g., wheezing, shortness of breath, unable to speak, severe coughing)      Gets out of breath easily  2. ONSET: "When did this breathing problem begin?"      3 weeks ago  3. PATTERN "Does the difficult breathing come and go, or has it been constant since it started?"      Intermittent  4. SEVERITY: "How bad is your breathing?" (e.g., mild, moderate, severe)    - MILD: No SOB at rest, mild SOB with walking, speaks normally in sentences, can lie down, no retractions, pulse < 100.    - MODERATE: SOB at rest, SOB with minimal exertion and prefers to sit, cannot lie down flat, speaks in phrases, mild retractions, audible wheezing, pulse 100-120.    - SEVERE: Very SOB at rest, speaks in single  words, struggling to breathe, sitting hunched forward, retractions, pulse > 120      Mild to moderate  5. RECURRENT SYMPTOM: "Have you had difficulty breathing before?" If Yes, ask: "When was the last time?" and "What happened that time?"      No 6. CARDIAC HISTORY: "Do you have any history of heart disease?" (e.g., heart attack, angina, bypass surgery, angioplasty)      No 7. LUNG HISTORY: "Do you have any history of lung disease?"  (e.g., pulmonary embolus, asthma, emphysema)     No 8. CAUSE: "What do you think is causing the breathing problem?"      Recently diagnosed with COVID  9. OTHER SYMPTOMS: "Do you have any other symptoms? (e.g., dizziness, runny nose, cough, chest pain, fever)     Intermittent headache  10. O2 SATURATION MONITOR:  "Do you use an oxygen saturation monitor (pulse oximeter) at home?" If Yes, ask: "What is your reading (oxygen level) today?" "What is your usual oxygen saturation reading?" (e.g., 95%)       96%  Protocols used: Breathing Difficulty-A-AH

## 2024-05-28 ENCOUNTER — Telehealth: Payer: Self-pay

## 2024-05-28 ENCOUNTER — Other Ambulatory Visit: Payer: Self-pay

## 2024-05-28 ENCOUNTER — Other Ambulatory Visit: Payer: Self-pay | Admitting: Urology

## 2024-05-28 DIAGNOSIS — N401 Enlarged prostate with lower urinary tract symptoms: Secondary | ICD-10-CM

## 2024-05-28 DIAGNOSIS — N138 Other obstructive and reflux uropathy: Secondary | ICD-10-CM

## 2024-05-28 NOTE — Progress Notes (Signed)
 Surgical Physician Order Form [facility]  Dr. Legrand Rams, MD  * Scheduling expectation : Next Available  *Length of Case: 2 hours  *Clearance needed: no  *Anticoagulation Instructions: Hold all anticoagulants  *Aspirin Instructions: Hold Aspirin  *Post-op visit Date/Instructions:  1-3 day cath removal  *Diagnosis: BPH w/urinary obstruction  *Procedure:  HOLEP (09811)   Additional orders: N/A  -Admit type: OUTpatient  -Anesthesia: General  -VTE Prophylaxis Standing Order SCD's       Other:   -Standing Lab Orders Per Anesthesia    Lab other: UA&Urine Culture  -Standing Test orders EKG/Chest x-ray per Anesthesia       Test other:   - Medications:  Ancef 2gm IV  -Other orders:  N/A

## 2024-05-28 NOTE — Telephone Encounter (Signed)
 Per Dr. Estanislao Heimlich,  Patient is to be scheduled for Holmium Laser Enucleation of the Prostate   Benjamin Medina was contacted and possible surgical dates were discussed, Friday June 13th, 2025 was agreed upon for surgery.   Patient was instructed that Dr. Estanislao Heimlich will require them to provide a pre-op UA & CX prior to surgery. This was ordered and scheduled drop off appointment was made for 06/04/2024 at the [facility].    Patient was directed to call [phone] between 1-3pm the day before surgery to find out surgical arrival time.  Instructions were given not to eat or drink from midnight on the night before surgery and have a driver for the day of surgery. On the surgery day patient was instructed to enter through the Medical Mall entrance of [facility] report the Same Day Surgery desk.   Pre-Admit Testing will be in contact via phone to set up an interview with the anesthesia team to review your history and medications prior to surgery.   Reminder of this information was sent via MyChart to the patient.

## 2024-05-28 NOTE — Progress Notes (Signed)
   [facility] Posting Form  Surgery Date: Date: 06/11/2024  Surgeon: Dr. Jay Meth, MD  Inpt ( No  )   Outpt (Yes)   Obs ( No  )   Diagnosis: N40.1, N13.8 Benign Prostatic Hyperplasia with Urinary Obstruction  -CPT: [address]  Surgery: Holmium Laser Enucleation of the Prostate  Stop Anticoagulations: Yes and also hold ASA  Cardiac/Medical/Pulmonary Clearance needed: no  *Orders entered into EPIC  Date: 05/28/24   *Case booked in [place]  Date: 05/28/24  *Notified pt of Surgery: Date: 05/28/24  PRE-OP UA & CX: yes, will obtain at the [facility] on 06/04/2024  *Placed into Prior Authorization Work [place] Date: 05/28/24  Assistant/laser/rep:No

## 2024-06-04 ENCOUNTER — Encounter
Admission: RE | Admit: 2024-06-04 | Discharge: 2024-06-04 | Disposition: A | Discharge status: Home / Self Care | Source: Ambulatory Visit | Attending: Urology | Admitting: Urology

## 2024-06-04 ENCOUNTER — Other Ambulatory Visit

## 2024-06-04 ENCOUNTER — Other Ambulatory Visit: Payer: Self-pay

## 2024-06-04 VITALS — Wt 304.0 lb

## 2024-06-04 DIAGNOSIS — Z01818 Encounter for other preprocedural examination: Secondary | ICD-10-CM | POA: Insufficient documentation

## 2024-06-04 DIAGNOSIS — Z01810 Encounter for preprocedural cardiovascular examination: Secondary | ICD-10-CM

## 2024-06-04 DIAGNOSIS — E118 Type 2 diabetes mellitus with unspecified complications: Secondary | ICD-10-CM | POA: Diagnosis not present

## 2024-06-04 DIAGNOSIS — N401 Enlarged prostate with lower urinary tract symptoms: Secondary | ICD-10-CM | POA: Diagnosis not present

## 2024-06-04 DIAGNOSIS — Z01812 Encounter for preprocedural laboratory examination: Secondary | ICD-10-CM

## 2024-06-04 DIAGNOSIS — I10 Essential (primary) hypertension: Secondary | ICD-10-CM | POA: Diagnosis not present

## 2024-06-04 DIAGNOSIS — N138 Other obstructive and reflux uropathy: Secondary | ICD-10-CM | POA: Diagnosis not present

## 2024-06-04 HISTORY — DX: Personal history of other infectious and parasitic diseases: Z86.19

## 2024-06-04 HISTORY — DX: Dyspnea, unspecified: R06.00

## 2024-06-04 HISTORY — DX: Type 2 diabetes mellitus with unspecified complications: E11.8

## 2024-06-04 LAB — BASIC METABOLIC PANEL WITH GFR
Anion gap: 9 (ref 5–15)
BUN: 20 mg/dL (ref 8–23)
CO2: 21 mmol/L — ABNORMAL LOW (ref 22–32)
Calcium: 9.4 mg/dL (ref 8.9–10.3)
Chloride: 106 mmol/L (ref 98–111)
Creatinine, Ser: 1.01 mg/dL (ref 0.61–1.24)
GFR, Estimated: >60 mL/min (ref >60)
Glucose, Bld: 121 mg/dL — ABNORMAL HIGH (ref 70–99)
Potassium: 4 mmol/L (ref 3.5–5.1)
Sodium: 136 mmol/L (ref 135–145)

## 2024-06-04 LAB — MICROSCOPIC EXAMINATION

## 2024-06-04 LAB — URINALYSIS, COMPLETE
Bilirubin, UA: NEGATIVE
Ketones, UA: NEGATIVE
Leukocytes,UA: NEGATIVE
Nitrite, UA: NEGATIVE
RBC, UA: NEGATIVE
Specific Gravity, UA: 1.025 (ref 1.005–1.030)
Urobilinogen, Ur: 0.2 mg/dL (ref 0.2–1.0)
pH, UA: 6 (ref 5.0–7.5)

## 2024-06-04 NOTE — Addendum Note (Signed)
 Addended by: Sheryn Doom A on: 06/04/2024 10:11 AM   Modules accepted: Orders

## 2024-06-04 NOTE — Patient Instructions (Addendum)
 Your procedure is scheduled on:06-11-24 Friday Report to the Registration Desk on the 1st floor of the Medical Mall.Then proceed to the 2nd floor Surgery Desk To find out your arrival time, please call [phone] between 1PM - 3PM on:06-10-24 Thursday If your arrival time is 6:00 am, do not arrive before that time as the Medical Mall entrance doors do not open until 6:00 am.  REMEMBER: Instructions that are not followed completely may result in serious medical risk, up to and including death; or upon the discretion of your surgeon and anesthesiologist your surgery may need to be rescheduled.  Do not eat food OR drink any liquids after midnight the night before surgery.  No gum chewing or hard candies.  One week prior to surgery:Stop NOW (06-04-24) Stop Anti-inflammatories (NSAIDS) such as Advil, Aleve, Ibuprofen, Motrin, Naproxen, Naprosyn and Aspirin based products such as Excedrin, Goody's Powder, BC Powder. Stop ANY OVER THE COUNTER supplements until after surgery.  You may however, continue to take Tylenol  if needed for pain up until the day of surgery.  Continue taking all of your other prescription medications up until the day of surgery.  Take the following medication the day of surgery with a small sip of water: -atorvastatin  (LIPITOR)  -terazosin  (HYTRIN )   No Alcohol for 24 hours before or after surgery.  No Smoking including e-cigarettes for 24 hours before surgery.  No chewable tobacco products for at least 6 hours before surgery.  No nicotine patches on the day of surgery.  Do not use any "recreational" drugs for at least a week (preferably 2 weeks) before your surgery.  Please be advised that the combination of cocaine and anesthesia may have negative outcomes, up to and including death. If you test positive for cocaine, your surgery will be cancelled.  On the morning of surgery brush your teeth with toothpaste and water, you may rinse your mouth with mouthwash if you  wish. Do not swallow any toothpaste or mouthwash.  Do not wear jewelry, make-up, hairpins, clips or nail polish.  For welded (permanent) jewelry: bracelets, anklets, waist bands, etc.  Please have this removed prior to surgery.  If it is not removed, there is a chance that hospital personnel will need to cut it off on the day of surgery.  Do not wear lotions, powders, or perfumes.   Do not shave body hair from the neck down 48 hours before surgery.  Contact lenses, hearing aids and dentures may not be worn into surgery.  Do not bring valuables to the hospital. [facility] is not responsible for any missing/lost belongings or valuables.   Notify your doctor if there is any change in your medical condition (cold, fever, infection).  Wear comfortable clothing (specific to your surgery type) to the hospital.  After surgery, you can help prevent lung complications by doing breathing exercises.  Take deep breaths and cough every 1-2 hours. Your doctor may order a device called an Incentive Spirometer to help you take deep breaths. When coughing or sneezing, hold a pillow firmly against your incision with both hands. This is called "splinting." Doing this helps protect your incision. It also decreases belly discomfort.  If you are being admitted to the hospital overnight, leave your suitcase in the car. After surgery it may be brought to your room.  In case of increased patient census, it may be necessary for you, the patient, to continue your postoperative care in the Same Day Surgery department.  If you are being discharged the  day of surgery, you will not be allowed to drive home. You will need a responsible individual to drive you home and stay with you for 24 hours after surgery.   If you are taking public transportation, you will need to have a responsible individual with you.  Please call the Pre-admissions Testing Dept. at [phone] if you have any questions about these  instructions.  Surgery Visitation Policy:  Patients having surgery or a procedure may have two visitors.  Children under the age of 44 must have an adult with them who is not the patient.

## 2024-06-07 LAB — CULTURE, URINE COMPREHENSIVE

## 2024-06-11 ENCOUNTER — Ambulatory Visit
Admission: RE | Admit: 2024-06-11 | Discharge: 2024-06-11 | Disposition: A | Discharge status: Home / Self Care | Attending: Urology | Admitting: Urology

## 2024-06-11 ENCOUNTER — Ambulatory Visit: Payer: Self-pay | Admitting: Urgent Care

## 2024-06-11 ENCOUNTER — Encounter
Admission: RE | Disposition: A | Discharge status: Home / Self Care | Payer: Self-pay | Source: Home / Self Care | Attending: Urology

## 2024-06-11 ENCOUNTER — Other Ambulatory Visit: Payer: Self-pay

## 2024-06-11 ENCOUNTER — Encounter: Payer: Self-pay | Admitting: Urology

## 2024-06-11 DIAGNOSIS — Z01818 Encounter for other preprocedural examination: Secondary | ICD-10-CM

## 2024-06-11 DIAGNOSIS — I10 Essential (primary) hypertension: Secondary | ICD-10-CM | POA: Diagnosis not present

## 2024-06-11 DIAGNOSIS — E118 Type 2 diabetes mellitus with unspecified complications: Secondary | ICD-10-CM | POA: Diagnosis not present

## 2024-06-11 DIAGNOSIS — Z87891 Personal history of nicotine dependence: Secondary | ICD-10-CM | POA: Insufficient documentation

## 2024-06-11 DIAGNOSIS — C61 Malignant neoplasm of prostate: Secondary | ICD-10-CM | POA: Insufficient documentation

## 2024-06-11 DIAGNOSIS — N138 Other obstructive and reflux uropathy: Secondary | ICD-10-CM | POA: Insufficient documentation

## 2024-06-11 DIAGNOSIS — N401 Enlarged prostate with lower urinary tract symptoms: Secondary | ICD-10-CM | POA: Insufficient documentation

## 2024-06-11 HISTORY — PX: HOLEP-LASER ENUCLEATION OF THE PROSTATE WITH MORCELLATION: SHX6641

## 2024-06-11 HISTORY — PX: PROSTATE SURGERY: SHX751

## 2024-06-11 LAB — GLUCOSE, CAPILLARY: Glucose-Capillary: 97 mg/dL (ref 70–99)

## 2024-06-11 SURGERY — ENUCLEATION, PROSTATE, USING LASER, WITH MORCELLATION
Anesthesia: General

## 2024-06-11 MED ORDER — LIDOCAINE HCL (CARDIAC) PF 100 MG/5ML IV SOSY
PREFILLED_SYRINGE | INTRAVENOUS | Status: DC | PRN
  Administered 2024-06-11: 100 mg via INTRAVENOUS

## 2024-06-11 MED ORDER — OXYCODONE HCL 5 MG PO TABS
5.0000 mg | ORAL_TABLET | Freq: Once | ORAL | Status: AC | PRN
  Administered 2024-06-11: 5 mg via ORAL

## 2024-06-11 MED ORDER — DEXAMETHASONE SODIUM PHOSPHATE 10 MG/ML IJ SOLN
INTRAMUSCULAR | Status: DC | PRN
  Administered 2024-06-11: 10 mg via INTRAVENOUS

## 2024-06-11 MED ORDER — MIDAZOLAM HCL 2 MG/2ML IJ SOLN
INTRAMUSCULAR | Status: DC | PRN
  Administered 2024-06-11: 2 mg via INTRAVENOUS

## 2024-06-11 MED ORDER — PROPOFOL 10 MG/ML IV BOLUS
INTRAVENOUS | Status: DC | PRN
  Administered 2024-06-11: 160 mg via INTRAVENOUS

## 2024-06-11 MED ORDER — LACTATED RINGERS IV SOLN: INTRAVENOUS | Status: DC | PRN

## 2024-06-11 MED ORDER — SUCCINYLCHOLINE CHLORIDE 200 MG/10ML IV SOSY
PREFILLED_SYRINGE | INTRAVENOUS | Status: DC | PRN
  Administered 2024-06-11: 120 mg via INTRAVENOUS

## 2024-06-11 MED ORDER — SODIUM CHLORIDE 0.9 % IR SOLN
Status: DC | PRN
Start: 2024-06-11 — End: 2024-06-11
  Administered 2024-06-11: 15000 mL
  Administered 2024-06-11: 6000 mL
  Administered 2024-06-11: 12000 mL

## 2024-06-11 MED ORDER — SUGAMMADEX SODIUM 200 MG/2ML IV SOLN
INTRAVENOUS | Status: DC | PRN
  Administered 2024-06-11: 200 mg via INTRAVENOUS

## 2024-06-11 MED ORDER — CEFAZOLIN SODIUM-DEXTROSE 3-4 GM/150ML-% IV SOLN
3.0000 g | INTRAVENOUS | Status: DC
  Filled 2024-06-11: qty 150

## 2024-06-11 MED ORDER — ONDANSETRON HCL 4 MG/2ML IJ SOLN
INTRAMUSCULAR | Status: DC | PRN
Start: 2024-06-11 — End: 2024-06-11
  Administered 2024-06-11 (×2): 4 mg via INTRAVENOUS

## 2024-06-11 MED ORDER — SODIUM CHLORIDE 0.9 % IV SOLN: INTRAVENOUS | Status: DC

## 2024-06-11 MED ORDER — CHLORHEXIDINE GLUCONATE 0.12 % MT SOLN
OROMUCOSAL | Status: AC
  Filled 2024-06-11: qty 15

## 2024-06-11 MED ORDER — DEXMEDETOMIDINE HCL IN NACL 200 MCG/50ML IV SOLN
INTRAVENOUS | Status: DC | PRN
  Administered 2024-06-11: 4 ug via INTRAVENOUS
  Administered 2024-06-11: 8 ug via INTRAVENOUS

## 2024-06-11 MED ORDER — FENTANYL CITRATE (PF) 100 MCG/2ML IJ SOLN: 25.0000 ug | INTRAMUSCULAR | Status: DC | PRN

## 2024-06-11 MED ORDER — OXYCODONE HCL 5 MG PO TABS
ORAL_TABLET | ORAL | Status: AC
Start: 2024-06-11 — End: 2024-06-11
  Filled 2024-06-11: qty 1

## 2024-06-11 MED ORDER — ACETAMINOPHEN 10 MG/ML IV SOLN
INTRAVENOUS | Status: DC | PRN
  Administered 2024-06-11: 1000 mg via INTRAVENOUS

## 2024-06-11 MED ORDER — TRAMADOL HCL 50 MG PO TABS: 25.0000 mg | ORAL_TABLET | Freq: Four times a day (QID) | ORAL | 0 refills | Status: AC | PRN

## 2024-06-11 MED ORDER — ROCURONIUM BROMIDE 100 MG/10ML IV SOLN
INTRAVENOUS | Status: DC | PRN
  Administered 2024-06-11: 50 mg via INTRAVENOUS
  Administered 2024-06-11: 20 mg via INTRAVENOUS

## 2024-06-11 MED ORDER — OXYCODONE HCL 5 MG/5ML PO SOLN: 5.0000 mg | Freq: Once | ORAL | Status: AC | PRN

## 2024-06-11 MED ORDER — CEFAZOLIN SODIUM-DEXTROSE 2-4 GM/100ML-% IV SOLN
INTRAVENOUS | Status: AC
  Filled 2024-06-11: qty 100

## 2024-06-11 MED ORDER — ONDANSETRON HCL 4 MG/2ML IJ SOLN: 4.0000 mg | Freq: Once | INTRAMUSCULAR | Status: DC | PRN

## 2024-06-11 MED ORDER — FENTANYL CITRATE (PF) 100 MCG/2ML IJ SOLN
INTRAMUSCULAR | Status: AC
Start: 2024-06-11 — End: 2024-06-11
  Filled 2024-06-11: qty 2

## 2024-06-11 MED ORDER — ORAL CARE MOUTH RINSE
15.0000 mL | Freq: Once | OROMUCOSAL | Status: AC
Start: 2024-06-11 — End: 2024-06-11

## 2024-06-11 MED ORDER — CHLORHEXIDINE GLUCONATE 0.12 % MT SOLN
15.0000 mL | Freq: Once | OROMUCOSAL | Status: AC
  Administered 2024-06-11: 15 mL via OROMUCOSAL

## 2024-06-11 MED ORDER — MIDAZOLAM HCL 2 MG/2ML IJ SOLN
INTRAMUSCULAR | Status: AC
  Filled 2024-06-11: qty 2

## 2024-06-11 MED ORDER — GLYCOPYRROLATE 0.2 MG/ML IJ SOLN
INTRAMUSCULAR | Status: DC | PRN
  Administered 2024-06-11: .2 mg via INTRAVENOUS

## 2024-06-11 MED ORDER — FENTANYL CITRATE (PF) 100 MCG/2ML IJ SOLN
INTRAMUSCULAR | Status: DC | PRN
  Administered 2024-06-11 (×2): 50 ug via INTRAVENOUS

## 2024-06-11 SURGICAL SUPPLY — 24 items
ADAPTER IRRIG TUBE 2 SPIKE SOL (ADAPTER) ×2 IMPLANT
BAG URO DRAIN 4000ML (MISCELLANEOUS) ×1 IMPLANT
CATH URETL OPEN END 4X70 (CATHETERS) ×1 IMPLANT
CATH URTH STD 24FR FL 3W 2 (CATHETERS) ×1 IMPLANT
CONTAINER COLLECT MORCELLATR (MISCELLANEOUS) ×1 IMPLANT
DRAPE UTILITY 15X26 TOWEL STRL (DRAPES) IMPLANT
FIBER LASER MOSES 550 DFL (Laser) ×1 IMPLANT
FILTER OVERFLOW MORCELLATOR (FILTER) ×1 IMPLANT
GLOVE BIOGEL PI IND STRL 7.5 (GLOVE) ×1 IMPLANT
GOWN STRL REUS W/ TWL LRG LVL3 (GOWN DISPOSABLE) ×1 IMPLANT
GOWN STRL REUS W/ TWL XL LVL3 (GOWN DISPOSABLE) ×1 IMPLANT
HOLDER FOLEY CATH W/STRAP (MISCELLANEOUS) ×1 IMPLANT
KIT TURNOVER CYSTO (KITS) ×1 IMPLANT
MEMBRANE SLNG YLW 17 FOR INST (MISCELLANEOUS) ×1 IMPLANT
MORCELLATOR ROTATION 4.75 335 (MISCELLANEOUS) ×1 IMPLANT
PACK CYSTO AR (MISCELLANEOUS) ×1 IMPLANT
SET CYSTO W/LG BORE CLAMP LF (SET/KITS/TRAYS/PACK) ×1 IMPLANT
SET IRRIG Y TYPE TUR BLADDER L (SET/KITS/TRAYS/PACK) ×1 IMPLANT
SLEEVE PROTECTION STRL DISP (MISCELLANEOUS) ×2 IMPLANT
SOL .9 NS 3000ML IRR UROMATIC (IV SOLUTION) ×5 IMPLANT
SURGILUBE 2OZ TUBE FLIPTOP (MISCELLANEOUS) ×1 IMPLANT
SYRINGE TOOMEY IRRIG 70ML (MISCELLANEOUS) ×1 IMPLANT
TUBE PUMP MORCELLATOR PIRANHA (TUBING) ×1 IMPLANT
WATER STERILE IRR 1000ML POUR (IV SOLUTION) ×1 IMPLANT

## 2024-06-11 NOTE — H&P (Signed)
 06/11/24 8:27 AM   Benjamin Medina 29-Aug-1951 [id number]  CC: BPH with obstruction  HPI: 73 year old male with obstructive urinary symptoms, weak stream, dribbling, urgency/frequency, nocturia, and elevated PVRs greater than 250 mL despite terazosin .  PSA normal.  Evaluation  shows significantly enlarged prostate with massive intravesical median lobe.  I think he will get the most benefit from outlet procedure, specifically HOLEP based on prostate volume.    PMH: Past Medical History:  Diagnosis Date   Acute DM type 2 causing complication (HCC)    Arthritis    Auditory impairment    BPH with urinary obstruction    Contracture of hand    PALMAR FASCIA   COVID-19 04/14/2024   Dyspnea    since having covid in april 2025   History of hepatitis C    treated back in 2020   Hyperlipidemia    Hypertension    Psoriasis     Surgical History: Past Surgical History:  Procedure Laterality Date   BLEPHAROPLASTY Bilateral 12/30/2014   COLONOSCOPY WITH PROPOFOL  N/A 05/01/2016   Procedure: COLONOSCOPY WITH PROPOFOL ;  Surgeon: Marshall Skeeter, MD;  Location: [facility];  Service: Endoscopy;  Laterality: N/A;   KNEE ARTHROSCOPY Left 01/14/2019   Procedure: ARTHROSCOPY KNEE WITH DEBRIDEMENT AND PARTIAL MEDIAL MENISCECTOMY;  Surgeon: Elner Hahn, MD;  Location: [facility];  Service: Orthopedics;  Laterality: Left;   KNEE ARTHROSCOPY WITH MEDIAL MENISECTOMY Right 11/27/2016   Procedure: KNEE ARTHROSCOPY WITH MEDIAL MENISECTOMY;  Surgeon: Elner Hahn, MD;  Location: [facility];  Service: Orthopedics;  Laterality: Right;   SINUS SURGERY WITH INSTATRAK     TOTAL KNEE ARTHROPLASTY Right 11/28/2022   Procedure: TOTAL KNEE ARTHROPLASTY;  Surgeon: Elner Hahn, MD;  Location: [facility];  Service: Orthopedics;  Laterality: Right;   VASECTOMY        Family History: Family History  Problem Relation Age of Onset   Colon cancer Mother 64   Lymphoma Father     Social History:   reports that he quit smoking about 38 years ago. His smoking use included cigarettes. He has never used smokeless tobacco. He reports that he does not currently use alcohol after a past usage of about 4.0 standard drinks of alcohol per week. He reports that he does not use drugs.  Physical Exam: BP (!) 149/85   Pulse 66   Temp (!) 96.9 F (36.1 C) (Tympanic)   Resp 16   Ht 6' 3 (1.905 m)   Wt (!) 137.9 kg   SpO2 100%   BMI 38.00 kg/m    Constitutional:  Alert and oriented, No acute distress. Cardiovascular: Regular rate and rhythm Respiratory: Clear to auscultation bilaterally GI: Abdomen is soft, nontender, nondistended, no abdominal masses   Laboratory Data: Urine culture 6/6 <10k mixed flora  Assessment & Plan:   73 year old male with 80 g prostate, large median lobe, obstructive urinary symptoms despite alpha blockers and incomplete emptying with obstructive urinary symptoms who opted for HOLEP.  We discussed the risks and benefits of HoLEP at length.  The procedure requires general anesthesia and takes 1 to 2 hours, and a holmium laser is used to enucleate the prostate and push this tissue into the bladder.  A morcellator is then used to remove this tissue, which is sent for pathology.  The vast majority(>95%) of patients are able to discharge the same day with a catheter in place for 2 to 3 days, and will follow-up in clinic for a voiding trial.  We specifically discussed the risks of bleeding, infection, retrograde ejaculation, temporary urgency and urge incontinence, very low risk of long-term incontinence, urethral stricture/bladder neck contracture, pathologic evaluation of prostate tissue and possible detection of prostate cancer or other malignancy, and possible need for additional procedures.   HOLEP today  Jay Meth, MD 06/11/2024  [facility] [address] [place], [place] [address] [phone]

## 2024-06-11 NOTE — Anesthesia Procedure Notes (Signed)
 Procedure Name: Intubation Date/Time: 06/11/2024 8:41 AM  Performed by: Niki Barter, CRNAPre-anesthesia Checklist: Patient identified, Emergency Drugs available, Suction available and Patient being monitored Patient Re-evaluated:Patient Re-evaluated prior to induction Oxygen Delivery Method: Circle system utilized Preoxygenation: Pre-oxygenation with 100% oxygen Induction Type: IV induction Ventilation: Mask ventilation without difficulty Laryngoscope Size: McGrath and 4 Grade View: Grade I Tube type: Oral Tube size: 7.0 mm Number of attempts: 1 Airway Equipment and Method: Stylet Placement Confirmation: ETT inserted through vocal cords under direct vision, positive ETCO2 and breath sounds checked- equal and bilateral Secured at: 21 cm Tube secured with: Tape Dental Injury: Teeth and Oropharynx as per pre-operative assessment

## 2024-06-11 NOTE — Anesthesia Postprocedure Evaluation (Signed)
 Anesthesia Post Note  Patient: Benjamin Medina  Procedure(s) Performed: ENUCLEATION, PROSTATE, USING LASER, WITH MORCELLATION  Patient location during evaluation: PACU Anesthesia Type: General Level of consciousness: awake and awake and alert Pain management: pain level controlled Vital Signs Assessment: post-procedure vital signs reviewed and stable Respiratory status: spontaneous breathing Cardiovascular status: blood pressure returned to baseline Anesthetic complications: no   No notable events documented.   Last Vitals:  Vitals:   06/11/24 0735 06/11/24 0951  BP: (!) 149/85 (!) 154/82  Pulse: 66 76  Resp: 16 13  Temp: (!) 36.1 C (!) 36.1 C  SpO2: 100% 100%    Last Pain:  Vitals:   06/11/24 0951  TempSrc:   PainSc: Asleep                 VAN STAVEREN,Taisa Deloria

## 2024-06-11 NOTE — Transfer of Care (Signed)
 Immediate Anesthesia Transfer of Care Note  Patient: Benjamin Medina  Procedure(s) Performed: ENUCLEATION, PROSTATE, USING LASER, WITH MORCELLATION  Patient Location: PACU  Anesthesia Type:General  Level of Consciousness: drowsy and patient cooperative  Airway & Oxygen Therapy: Patient Spontanous Breathing and Patient connected to face mask oxygen  Post-op Assessment: Report given to RN and Post -op Vital signs reviewed and stable  Post vital signs: Reviewed and stable  Last Vitals:  Vitals Value Taken Time  BP 154/82 06/11/24 09:51  Temp    Pulse 75 06/11/24 09:53  Resp 9 06/11/24 09:53  SpO2 100 % 06/11/24 09:53  Vitals shown include unfiled device data.  Last Pain:  Vitals:   06/11/24 0735  TempSrc: Tympanic  PainSc: 0-No pain         Complications: No notable events documented.

## 2024-06-11 NOTE — Anesthesia Preprocedure Evaluation (Addendum)
 Anesthesia Evaluation  Patient identified by MRN, date of birth, ID band Patient awake    Reviewed: Allergy & Precautions, NPO status , Patient's Chart, lab work & pertinent test results  Airway Mallampati: II  TM Distance: >3 FB Neck ROM: Full    Dental  (+) Teeth Intact   Pulmonary shortness of breath and with exertion, Patient abstained from smoking., former smoker   Pulmonary exam normal  + decreased breath sounds      Cardiovascular hypertension, Pt. on medications Normal cardiovascular exam Rhythm:Regular Rate:Normal     Neuro/Psych negative neurological ROS  negative psych ROS   GI/Hepatic negative GI ROS, Neg liver ROS,,,  Endo/Other  diabetes, Type 2, Oral Hypoglycemic Agents  Class 3 obesity  Renal/GU   negative genitourinary   Musculoskeletal   Abdominal  (+) + obese  Peds negative pediatric ROS (+)  Hematology negative hematology ROS (+)   Anesthesia Other Findings Past Medical History: No date: Acute DM type 2 causing complication (HCC) No date: Arthritis No date: 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 04/14/2024: COVID-19 No date: Dyspnea     Comment:  since having covid in april 2025 No date: History of hepatitis C     Comment:  treated back in 2020 No date: Hyperlipidemia No date: Hypertension No date: Psoriasis  Past Surgical History: 12/30/2014: BLEPHAROPLASTY; Bilateral 05/01/2016: COLONOSCOPY WITH PROPOFOL ; N/A     Comment:  Procedure: COLONOSCOPY WITH PROPOFOL ;  Surgeon: Marshall Skeeter, MD;  Location: [facility];  Service:               Endoscopy;  Laterality: N/A; 01/14/2019: KNEE ARTHROSCOPY; Left     Comment:  Procedure: ARTHROSCOPY KNEE WITH DEBRIDEMENT AND PARTIAL              MEDIAL MENISCECTOMY;  Surgeon: Elner Hahn, MD;                Location: [facility] ORS;  Service:  Orthopedics;  Laterality:               Left; 11/27/2016: KNEE ARTHROSCOPY WITH MEDIAL MENISECTOMY; Right     Comment:  Procedure: KNEE ARTHROSCOPY WITH MEDIAL MENISECTOMY;                Surgeon: Elner Hahn, MD;  Location: [facility];  Service: Orthopedics;  Laterality: Right; No date: SINUS SURGERY WITH INSTATRAK 11/28/2022: TOTAL KNEE ARTHROPLASTY; Right     Comment:  Procedure: TOTAL KNEE ARTHROPLASTY;  Surgeon: Elner Hahn, MD;  Location: [facility] ORS;  Service: Orthopedics;                Laterality: Right; No date: VASECTOMY  BMI    Body Mass Index: 38.00 kg/m      Reproductive/Obstetrics negative OB ROS                              Anesthesia Physical Anesthesia Plan  ASA: 3  Anesthesia Plan: General   Post-op Pain Management:    Induction: Intravenous  PONV Risk Score and Plan: Ondansetron , Dexamethasone , Midazolam  and Treatment may vary due to age or  medical condition  Airway Management Planned: Oral ETT  Additional Equipment:   Intra-op Plan:   Post-operative Plan: Extubation in OR  Informed Consent: I have reviewed the patients History and Physical, chart, labs and discussed the procedure including the risks, benefits and alternatives for the proposed anesthesia with the patient or authorized representative who has indicated his/her understanding and acceptance.     Dental Advisory Given  Plan Discussed with: CRNA  Anesthesia Plan Comments:         Anesthesia Quick Evaluation

## 2024-06-11 NOTE — Op Note (Signed)
 Date of procedure: 06/11/24  Preoperative diagnosis:  BPH with obstruction  Postoperative diagnosis:  Same  Procedure: HoLEP (Holmium Laser Enucleation of the Prostate)  Surgeon: Jay Meth, MD  Anesthesia: General  Complications: None  Intraoperative findings:  Massive prostate with very large median lobe, moderate to severe bladder trabeculations but no suspicious lesions Uncomplicated HOLEP, ureteral orifices and verumontanum intact at conclusion of case  EBL: Minimal  Specimens: Prostate chips  Enucleation time: 20 minutes  Morcellation time: 22 minutes  Intra-op weight: 86g  Drains: 24 French three-way, 60 cc in balloon  Indication: Benjamin Medina is a 73 y.o. patient with BPH and obstructive symptoms, incomplete emptying despite alpha blockers who opted for HOLEP.  After reviewing the management options for treatment, they elected to proceed with the above surgical procedure(s). We have discussed the potential benefits and risks of the procedure, side effects of the proposed treatment, the likelihood of the patient achieving the goals of the procedure, and any potential problems that might occur during the procedure or recuperation.  We specifically discussed the risks of bleeding, infection, hematuria and clot retention, need for additional procedures, possible overnight hospital stay, temporary urgency and incontinence, rare long-term incontinence, and retrograde ejaculation.  Informed consent has been obtained.   Description of procedure:  The patient was taken to the operating room and general anesthesia was induced.  The patient was placed in the dorsal lithotomy position, prepped and draped in the usual sterile fashion, and preoperative antibiotics(Ancef ) were administered.  SCDs were placed for DVT prophylaxis.  A preoperative time-out was performed.   Dustin Gimenez sounds were used to gently dilated the urethra up to 69F. The 43 French continuous flow  resectoscope was inserted into the urethra using the visual obturator  The prostate was large with massive median lobe. The bladder was thoroughly inspected and notable for moderate to severe trabeculations but no suspicious lesions.  The ureteral orifices were located in orthotopic position.    The laser was set to 2 J and 60 Hz and early apical release was performed by making a circumferential mucosal incision proximal to the sphincter.  A lambda incision was then made proximal to the verumontanum.  The prostate was enucleated en bloc circumferentially into the bladder.  The capsule was examined and laser was used for meticulous hemostasis.    The 76 French resectoscope was then switched out for the 26 French nephroscope and prostate tissue was morcellated(Piranha) and the tissue sent to pathology.  A 24 French three-way catheter was inserted easily with the aid of a catheter guide, and 60 cc were placed in the balloon.  Urine was pink.  The catheter irrigated easily with a Toomey syringe.  CBI was initiated.   The patient tolerated the procedure well without any immediate complications and was extubated and transferred to the recovery room in stable condition.  Urine was faint pink on fast CBI.  Disposition: Stable to PACU  Plan: Wean CBI in PACU, anticipate discharge home today with Foley removal in clinic in 2-3 days  Jay Meth, MD 06/11/2024

## 2024-06-12 ENCOUNTER — Encounter: Payer: Self-pay | Admitting: Urology

## 2024-06-12 ENCOUNTER — Other Ambulatory Visit: Payer: Self-pay | Admitting: Cardiology

## 2024-06-12 DIAGNOSIS — I10 Essential (primary) hypertension: Secondary | ICD-10-CM

## 2024-06-14 ENCOUNTER — Telehealth: Payer: Self-pay | Admitting: *Deleted

## 2024-06-14 ENCOUNTER — Ambulatory Visit: Admitting: Physician Assistant

## 2024-06-14 VITALS — BP 174/94 | HR 70 | Ht 75.0 in | Wt 304.0 lb

## 2024-06-14 DIAGNOSIS — N138 Other obstructive and reflux uropathy: Secondary | ICD-10-CM

## 2024-06-14 DIAGNOSIS — N401 Enlarged prostate with lower urinary tract symptoms: Secondary | ICD-10-CM

## 2024-06-14 LAB — SURGICAL PATHOLOGY

## 2024-06-14 NOTE — Telephone Encounter (Signed)
 Error

## 2024-06-14 NOTE — Patient Instructions (Signed)

## 2024-06-14 NOTE — Progress Notes (Signed)
 Catheter Removal  Patient is present today for a catheter removal.  56ml of water was drained from the balloon. A 24FR three-way foley cath was removed from the bladder, no complications were noted. Patient tolerated well.  Performed by: Charmeka Freeburg, PA-C   Additional notes: Counseled patient on normal postoperative findings including dysuria, gross hematuria, and urinary urgency/leakage. Counseled patient to begin Kegel exercises 3x10 sets daily to increase urinary control and wear absorbent products as needed for security. Written and verbal resources provided today. Surgical pathology pending; will defer to Dr. Estanislao Heimlich to share results when available.  Follow up: Return in about 4 months (around 10/14/2024) for Postop f/u with Dr. Estanislao Heimlich.

## 2024-06-15 ENCOUNTER — Telehealth: Payer: Self-pay | Admitting: Internal Medicine

## 2024-06-15 ENCOUNTER — Other Ambulatory Visit (HOSPITAL_COMMUNITY): Payer: Self-pay

## 2024-06-15 NOTE — Telephone Encounter (Signed)
 Copied from CRM [id number]. Topic: Clinical - Prescription Issue >> Jun 15, 2024 11:21 AM Felizardo Hotter wrote: Reason for CRM: Received call from [facility] per Terri ph: [phone], fax: [phone] stated Ozempic  can not be of a [place]. They will sent fax to appeal.

## 2024-06-16 ENCOUNTER — Other Ambulatory Visit (HOSPITAL_COMMUNITY): Payer: Self-pay

## 2024-06-16 ENCOUNTER — Ambulatory Visit: Payer: Self-pay | Admitting: Urology

## 2024-06-16 DIAGNOSIS — C61 Malignant neoplasm of prostate: Secondary | ICD-10-CM

## 2024-06-16 NOTE — Telephone Encounter (Signed)
 PSA ordered

## 2024-06-16 NOTE — Telephone Encounter (Signed)
 Called pt , left a voicemail.  Benjamin Medina

## 2024-06-17 NOTE — Telephone Encounter (Signed)
 Tried to call pt. Left a voicemail for him to call back.  Benjamin Medina

## 2024-06-17 NOTE — Telephone Encounter (Signed)
 Copied from CRM [id number]. Topic: General - Other >> Jun 16, 2024  4:50 PM Santiya F wrote: Reason for CRM: Patient is calling in returning a call from [facility] please follow up with patient.

## 2024-07-07 ENCOUNTER — Telehealth: Admitting: Urology

## 2024-07-07 DIAGNOSIS — C61 Malignant neoplasm of prostate: Secondary | ICD-10-CM

## 2024-07-07 DIAGNOSIS — N138 Other obstructive and reflux uropathy: Secondary | ICD-10-CM

## 2024-07-07 DIAGNOSIS — R399 Unspecified symptoms and signs involving the genitourinary system: Secondary | ICD-10-CM | POA: Diagnosis not present

## 2024-07-07 DIAGNOSIS — N401 Enlarged prostate with lower urinary tract symptoms: Secondary | ICD-10-CM

## 2024-07-07 NOTE — Progress Notes (Signed)
 Virtual Visit via Telephone Note  I connected with Benjamin Medina on 07/07/24 at  3:45 PM EDT by telephone and verified that I am speaking with the correct person using two identifiers.   Patient location: Home Provider location: [facility]   I discussed the limitations, risks, security and privacy concerns of performing an evaluation and management service by telephone and the availability of in person appointments. We discussed the impact of the COVID-19 pandemic on the healthcare system, and the importance of social distancing and reducing patient and provider exposure. I also discussed with the patient that there may be a patient responsible charge related to this service. The patient expressed understanding and agreed to proceed.  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  He had questions about this today.  Reassurance was provided regarding very low-volume low risk disease and that this can be safely monitored with active surveillance, extremely low chance of requiring additional treatment in the future.  Has recovered well from HOLEP, minimal leakage and excellent stream.  He will keep follow-up as scheduled and we will repeat PSA prior to follow-up visit.   I discussed the assessment and treatment plan with the patient. The patient was provided an opportunity to ask questions and all were answered. The patient agreed with the plan and demonstrated an understanding of the instructions.   The patient was advised to call back or seek an in-person evaluation if the symptoms worsen or if the condition fails to improve as anticipated.  I provided 12 minutes of non-face-to-face time during this encounter.   Redell JAYSON Burnet, MD

## 2024-07-11 ENCOUNTER — Other Ambulatory Visit: Payer: Self-pay

## 2024-07-11 ENCOUNTER — Emergency Department
Admission: EM | Admit: 2024-07-11 | Discharge: 2024-07-11 | Disposition: A | Discharge status: Home / Self Care | Attending: Emergency Medicine | Admitting: Emergency Medicine

## 2024-07-11 DIAGNOSIS — R319 Hematuria, unspecified: Secondary | ICD-10-CM | POA: Insufficient documentation

## 2024-07-11 HISTORY — DX: Benign prostatic hyperplasia without lower urinary tract symptoms: N40.0

## 2024-07-11 LAB — URINALYSIS, ROUTINE W REFLEX MICROSCOPIC
Bilirubin Urine: NEGATIVE
Glucose, UA: NEGATIVE mg/dL
Ketones, ur: NEGATIVE mg/dL
Nitrite: NEGATIVE
Protein, ur: 100 mg/dL — AB
RBC / HPF: >50 RBC/hpf (ref 0–5)
Specific Gravity, Urine: 1.016 (ref 1.005–1.030)
Squamous Epithelial / HPF: 0 /HPF (ref 0–5)
WBC, UA: >50 WBC/hpf (ref 0–5)
pH: 6 (ref 5.0–8.0)

## 2024-07-11 LAB — CBC WITH DIFFERENTIAL/PLATELET
Abs Immature Granulocytes: 0.03 K/uL (ref 0.00–0.07)
Basophils Absolute: 0.1 K/uL (ref 0.0–0.1)
Basophils Relative: 1 %
Eosinophils Absolute: 0.3 K/uL (ref 0.0–0.5)
Eosinophils Relative: 4 %
HCT: 40.5 % (ref 39.0–52.0)
Hemoglobin: 13.4 g/dL (ref 13.0–17.0)
Immature Granulocytes: 0 %
Lymphocytes Relative: 22 %
Lymphs Abs: 1.9 K/uL (ref 0.7–4.0)
MCH: 31.5 pg (ref 26.0–34.0)
MCHC: 33.1 g/dL (ref 30.0–36.0)
MCV: 95.3 fL (ref 80.0–100.0)
Monocytes Absolute: 0.8 K/uL (ref 0.1–1.0)
Monocytes Relative: 10 %
Neutro Abs: 5.4 K/uL (ref 1.7–7.7)
Neutrophils Relative %: 63 %
Platelets: 273 K/uL (ref 150–400)
RBC: 4.25 MIL/uL (ref 4.22–5.81)
RDW: 14.2 % (ref 11.5–15.5)
WBC: 8.5 K/uL (ref 4.0–10.5)
nRBC: 0 % (ref 0.0–0.2)

## 2024-07-11 LAB — BASIC METABOLIC PANEL WITH GFR
Anion gap: 10 (ref 5–15)
BUN: 14 mg/dL (ref 8–23)
CO2: 21 mmol/L — ABNORMAL LOW (ref 22–32)
Calcium: 9.6 mg/dL (ref 8.9–10.3)
Chloride: 106 mmol/L (ref 98–111)
Creatinine, Ser: 0.93 mg/dL (ref 0.61–1.24)
GFR, Estimated: >60 mL/min (ref >60)
Glucose, Bld: 126 mg/dL — ABNORMAL HIGH (ref 70–99)
Potassium: 4.1 mmol/L (ref 3.5–5.1)
Sodium: 137 mmol/L (ref 135–145)

## 2024-07-11 NOTE — Discharge Instructions (Addendum)
 We are holding any antibiotics given you had no symptoms and this is probably just related to the surgery however your urine culture is in process if you develop inability to urinate secondary to the blood clots return to the ER for Foley placement otherwise return to the ER if develop symptoms of low blood levels such as weakness, shortness of breath fatigue for recheck of your hemoglobin although this is very rare.  Avoid any aspirin, NSAIDs ibuprofen, blood thinners.  Please call urology to make a follow-up appointment.

## 2024-07-11 NOTE — ED Provider Notes (Signed)
 [facility] Provider Note    Event Date/Time   First MD Initiated Contact with Patient 07/11/24 1027     (approximate)   History   Hematuria and Post-op Problem   HPI  Benjamin Medina is a 73 y.o. male who is status post prostate surgery on 6/11 who reports everything was going well until yesterday he had some blood in his urine with some clots that were quarter size.  Patient reports that he had a lot of clots yesterday but today the clots of gotten better and he still been able to urinate.  He denies been on any blood thinners, aspirin.  He denies any issues with this since the surgery but still able to urinate.  Denies any pain     Physical Exam   Triage Vital Signs: ED Triage Vitals  Encounter Vitals Group     BP 07/11/24 1017 (!) 169/82     Girls Systolic BP Percentile --      Girls Diastolic BP Percentile --      Boys Systolic BP Percentile --      Boys Diastolic BP Percentile --      Pulse Rate 07/11/24 1017 72     Resp 07/11/24 1017 20     Temp 07/11/24 1017 97.9 F (36.6 C)     Temp Source 07/11/24 1017 Oral     SpO2 07/11/24 1017 98 %     Weight --      Height --      Head Circumference --      Peak Flow --      Pain Score 07/11/24 1018 0     Pain Loc --      Pain Education --      Exclude from Growth Chart --     Most recent vital signs: Vitals:   07/11/24 1017  BP: (!) 169/82  Pulse: 72  Resp: 20  Temp: 97.9 F (36.6 C)  SpO2: 98%     General: Awake, no distress.  CV:  Good peripheral perfusion.  Resp:  Normal effort.  Abd:  No distention.  Soft and nontender Other:     ED Results / Procedures / Treatments   Labs (all labs ordered are listed, but only abnormal results are displayed) Labs Reviewed  CBC WITH DIFFERENTIAL/PLATELET  BASIC METABOLIC PANEL WITH GFR  URINALYSIS, ROUTINE W REFLEX MICROSCOPIC      PROCEDURES:  Critical Care performed: No  Procedures   MEDICATIONS ORDERED IN  ED: Medications - No data to display   IMPRESSION / MDM / ASSESSMENT AND PLAN / ED COURSE  I reviewed the triage vital signs and the nursing notes.   Patient's presentation is most consistent with acute presentation with potential threat to life or bodily function.   Patient comes in with hematuria.  Denies any symptoms suggest kidney stone.  Postvoid bladder scan is normal.  Discussed with urology who does not recommend placing any Foley and to have patient return if he develops any signs of urinary retention.  BMP was reassuring without evidence of AKI CBC reassuring with no evidence of anemia or infection.  UA does show some budding yeast and some WBCs.  Will send for culture.  He denies any urinary symptoms prior to this happening  Discussed with Dr. Francisca given patient did not have any symptoms he recommends holding off on any antibiotics or antifungal medications get follow-up with urine culture.  He states that this can all just be  postoperative stuff that was stuck in the bladder after surgery.  Discussed this with patient he felt comfortable with this plan he understands return if he develops any retention.    FINAL CLINICAL IMPRESSION(S) / ED DIAGNOSES   Final diagnoses:  Hematuria, unspecified type     Rx / DC Orders   ED Discharge Orders     None        Note:  This document was prepared using Dragon voice recognition software and may include unintentional dictation errors.   Ernest Ronal BRAVO, MD 07/11/24 1235

## 2024-07-11 NOTE — ED Triage Notes (Signed)
 Pt had prostate surgery on 6/13. Everything was going well then yesterday had blood in urine with some clots, quarter size. Denies urinary obstruction. No pain. Family member states she took his VS yesterday and they were normal. Hematuria was less today.  Basic labs sent in case ordered by provider.

## 2024-07-11 NOTE — ED Notes (Signed)
 See triage notes. Pt is CAOx4, breathing normally, and normal in color. Pt in NAD and denies any needs. Pt's wife at bedside.

## 2024-07-12 ENCOUNTER — Telehealth: Payer: Self-pay

## 2024-07-12 LAB — URINE CULTURE: Culture: NO GROWTH

## 2024-07-12 NOTE — Telephone Encounter (Signed)
 Pt called triage line- informed that he was see in the ER on Sunday for hematuria. Pt was talking and then the line went fuzzy. Unable to hear pt.     U/a and Ucx sent. NO ATBs per [facility] until cx is back.   Pt is able to urinate. No pain. Pt is feeling better today.   Pt will contact us  if unable to urinate, fever or pain.  Pt was curious as to why this happened. He wanted to make sure it was normal.   Encouraged pt to push fluids. If sx worsen to contact office. Pt voiced understanding.

## 2024-07-13 ENCOUNTER — Telehealth: Payer: Self-pay

## 2024-07-13 NOTE — Transitions of Care (Post Inpatient/ED Visit) (Unsigned)
   07/13/2024  Name: Benjamin Medina MRN: [id number] DOB: 1951/07/21  Today's TOC FU Call Status: Today's TOC FU Call Status:: Unsuccessful Call (1st Attempt) Unsuccessful Call (1st Attempt) Date: 07/13/24  Attempted to reach the patient regarding the most recent Inpatient/ED visit.  Follow Up Plan: Additional outreach attempts will be made to reach the patient to complete the Transitions of Care (Post Inpatient/ED visit) call.   Jodine Muchmore [place]  Primary Care & Sports Medicine at [facility] CMA, AAMA [address]  [place] [address] Office [phone]  Fax: [phone]

## 2024-07-13 NOTE — Telephone Encounter (Signed)
 Copied from CRM [id number]. Topic: General - Other >> Jul 13, 2024 12:55 PM Montie POUR wrote: Reason for CRM:  Kodey is calling back Chassidy McAdoo. He will follow up with his Urology since he went to the emergency. If needed, please call him back. I tried to transfer to clinic and no answer. Thanks

## 2024-07-14 NOTE — Transitions of Care (Post Inpatient/ED Visit) (Unsigned)
   07/14/2024  Name: Benjamin Medina MRN: [id number] DOB: [birth year]  Today's TOC FU Call Status: Today's TOC FU Call Status:: Unsuccessful Call (2nd Attempt) Unsuccessful Call (1st Attempt) Date: 07/13/24 Unsuccessful Call (2nd Attempt) Date: 07/14/24  Attempted to reach the patient regarding the most recent Inpatient/ED visit.  Follow Up Plan: Additional outreach attempts will be made to reach the patient to complete the Transitions of Care (Post Inpatient/ED visit) call.   Aarib Pulido [place]  Primary [facility] Medicine at [facility] CMA, AAMA [address]  [place] [address] Office [phone]  Fax: [phone]

## 2024-07-15 NOTE — Transitions of Care (Post Inpatient/ED Visit) (Signed)
   07/15/2024  Name: Benjamin Medina MRN: [id number] DOB: [birth year]  Today's TOC FU Call Status: Today's TOC FU Call Status:: Unsuccessful Call (3rd Attempt) Unsuccessful Call (1st Attempt) Date: 07/13/24 Unsuccessful Call (2nd Attempt) Date: 07/14/24 Unsuccessful Call (3rd Attempt) Date: 07/15/24  Attempted to reach the patient regarding the most recent Inpatient/ED visit.  Follow Up Plan: No further outreach attempts will be made at this time. We have been unable to contact the patient.  Aliena Ghrist [place]  Primary Care [facility] Medicine at [facility] CMA, AAMA [address]  [place] [address] Office [phone]  Fax: [phone]

## 2024-07-15 NOTE — Telephone Encounter (Signed)
 Pt called the triage line stating he wanted to know why he was still having blood in his urine. Pt had surgery about 5 weeks ago. Placed pt on hold while I confirmed the timeframe for blood in urine after surgery. Pt ended call, tried to call him back, LVM for pt to return call.

## 2024-07-22 DIAGNOSIS — K080 Exfoliation of teeth due to systemic causes: Secondary | ICD-10-CM | POA: Diagnosis not present

## 2024-08-11 ENCOUNTER — Ambulatory Visit: Admitting: Internal Medicine

## 2024-08-11 ENCOUNTER — Encounter: Payer: Self-pay | Admitting: Internal Medicine

## 2024-08-11 VITALS — BP 128/78 | HR 67 | Ht 75.0 in | Wt 308.0 lb

## 2024-08-11 DIAGNOSIS — E785 Hyperlipidemia, unspecified: Secondary | ICD-10-CM | POA: Diagnosis not present

## 2024-08-11 DIAGNOSIS — E118 Type 2 diabetes mellitus with unspecified complications: Secondary | ICD-10-CM

## 2024-08-11 DIAGNOSIS — E1169 Type 2 diabetes mellitus with other specified complication: Secondary | ICD-10-CM

## 2024-08-11 DIAGNOSIS — I10 Essential (primary) hypertension: Secondary | ICD-10-CM | POA: Diagnosis not present

## 2024-08-11 LAB — POCT GLYCOSYLATED HEMOGLOBIN (HGB A1C): Hemoglobin A1C: 6.1 % — AB (ref 4.0–5.6)

## 2024-08-11 MED ORDER — ATORVASTATIN CALCIUM 10 MG PO TABS
ORAL_TABLET | ORAL | 1 refills | Status: DC
Start: 2024-08-11 — End: 2024-09-26

## 2024-08-11 MED ORDER — LISINOPRIL 40 MG PO TABS
40.0000 mg | ORAL_TABLET | Freq: Every day | ORAL | 1 refills | Status: AC
Start: 2024-08-11 — End: ?

## 2024-08-11 NOTE — Assessment & Plan Note (Signed)
 LDL is  Lab Results  Component Value Date   LDLCALC 121 (H) 03/31/2024   Currently taking atorvastatin .  He notes general muscle aches for some time now. Recommended LDL goal is < 70. He can take Co-Q 10 daily supplement to help reduce myalgia.

## 2024-08-11 NOTE — Progress Notes (Signed)
 Date:  08/11/2024   Name:  Benjamin Medina   DOB:  December 24, 1951   MRN:  [id number]   Chief Complaint: Hypertension and Diabetes  Hypertension This is a chronic problem. The problem is uncontrolled. Pertinent negatives include no chest pain, headaches, palpitations or shortness of breath. Past treatments include ACE inhibitors and diuretics. The current treatment provides moderate improvement.  Diabetes He presents for his follow-up diabetic visit. He has type 2 diabetes mellitus. Pertinent negatives for hypoglycemia include no dizziness or headaches. Pertinent negatives for diabetes include no chest pain, no fatigue and no weakness.  Hyperlipidemia This is a chronic problem. The problem is controlled. Pertinent negatives include no chest pain or shortness of breath. Current antihyperlipidemic treatment includes statins. The current treatment provides significant improvement of lipids. Compliance problems: mild muscle aches in general.     Review of Systems  Constitutional:  Negative for fatigue and unexpected weight change.  HENT:  Negative for nosebleeds.   Eyes:  Negative for visual disturbance.  Respiratory:  Negative for cough, chest tightness, shortness of breath and wheezing.   Cardiovascular:  Negative for chest pain, palpitations and leg swelling.  Gastrointestinal:  Negative for abdominal pain, constipation and diarrhea.  Neurological:  Negative for dizziness, weakness, light-headedness and headaches.     Lab Results  Component Value Date   NA 137 07/11/2024   K 4.1 07/11/2024   CO2 21 (L) 07/11/2024   GLUCOSE 126 (H) 07/11/2024   BUN 14 07/11/2024   CREATININE 0.93 07/11/2024   CALCIUM  9.6 07/11/2024   EGFR 85 03/31/2024   GFRNONAA >60 07/11/2024   Lab Results  Component Value Date   CHOL 188 03/31/2024   HDL 45 03/31/2024   LDLCALC 121 (H) 03/31/2024   TRIG 125 03/31/2024   CHOLHDL 4.2 03/31/2024   Lab Results  Component Value Date   TSH 1.040 07/09/2022    Lab Results  Component Value Date   HGBA1C 6.1 (A) 08/11/2024   Lab Results  Component Value Date   WBC 8.5 07/11/2024   HGB 13.4 07/11/2024   HCT 40.5 07/11/2024   MCV 95.3 07/11/2024   PLT 273 07/11/2024   Lab Results  Component Value Date   ALT 22 03/31/2024   AST 17 03/31/2024   ALKPHOS 75 03/31/2024   BILITOT 0.4 03/31/2024   No results found for: MARIEN BOLLS, VD25OH   Patient Active Problem List   Diagnosis Date Noted   COVID-19 virus infection 04/14/2024   Lymphedema 08/09/2022   Hx of hepatitis C 01/10/2020   Obesity, morbid (HCC) 03/24/2019   Hyperlipidemia associated with type 2 diabetes mellitus (HCC) 03/24/2019   DM type 2, controlled, with complication (HCC) 06/12/2018   Essential hypertension 06/02/2018   Auditory impairment 11/04/2015   Benign prostatic hyperplasia with urinary obstruction 11/04/2015   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   Generalized psoriasis 11/04/2015   Periodic limb movement 11/04/2015    No Known Allergies  Past Surgical History:  Procedure Laterality Date   BLEPHAROPLASTY Bilateral 12/30/2014   COLONOSCOPY WITH PROPOFOL  N/A 05/01/2016   Procedure: COLONOSCOPY WITH PROPOFOL ;  Surgeon: Reyes LELON Cota, MD;  Location: [facility];  Service: Endoscopy;  Laterality: N/A;   HOLEP-LASER ENUCLEATION OF THE PROSTATE WITH MORCELLATION N/A 06/11/2024   Procedure: ENUCLEATION, PROSTATE, USING LASER, WITH MORCELLATION;  Surgeon: Francisca Redell BROCKS, MD;  Location: [facility];  Service: Urology;  Laterality: N/A;   KNEE ARTHROSCOPY Left 01/14/2019   Procedure: ARTHROSCOPY KNEE WITH DEBRIDEMENT AND PARTIAL MEDIAL  MENISCECTOMY;  Surgeon: Edie Norleen PARAS, MD;  Location: [facility];  Service: Orthopedics;  Laterality: Left;   KNEE ARTHROSCOPY WITH MEDIAL MENISECTOMY Right 11/27/2016   Procedure: KNEE ARTHROSCOPY WITH MEDIAL MENISECTOMY;  Surgeon: Norleen PARAS Edie, MD;  Location: [facility];  Service: Orthopedics;   Laterality: Right;   PROSTATE SURGERY  06/11/2024   SINUS SURGERY WITH INSTATRAK     TOTAL KNEE ARTHROPLASTY Right 11/28/2022   Procedure: TOTAL KNEE ARTHROPLASTY;  Surgeon: Edie Norleen PARAS, MD;  Location: [facility];  Service: Orthopedics;  Laterality: Right;   VASECTOMY      Social History   Tobacco Use   Smoking status: Former    Current packs/day: 0.00    Types: Cigarettes    Quit date: 12/30/1985    Years since quitting: 38.6   Smokeless tobacco: Never  Vaping Use   Vaping status: Never Used  Substance Use Topics   Alcohol use: Not Currently    Alcohol/week: 4.0 standard drinks of alcohol    Types: 4 Standard drinks or equivalent per week   Drug use: No     Medication list has been reviewed and updated.  Current Meds  Medication Sig   fluticasone (FLONASE) 50 MCG/ACT nasal spray Place 1 spray into both nostrils daily as needed for allergies or rhinitis.   [DISCONTINUED] atorvastatin  (LIPITOR) 10 MG tablet TAKE 1 TABLET(10 MG) BY MOUTH DAILY   [DISCONTINUED] lisinopril  (ZESTRIL ) 40 MG tablet TAKE 1 TABLET BY MOUTH EVERY DAY       08/11/2024   10:17 AM 05/14/2024   10:45 AM 03/31/2024    9:37 AM 11/20/2023    9:56 AM  GAD 7 : Generalized Anxiety Score  Nervous, Anxious, on Edge 0 0 0 0  Control/stop worrying 0 0 0 0  Worry too much - different things 0  0 0  Trouble relaxing 0  0 0  Restless 0  0 0  Easily annoyed or irritable 0  0 0  Afraid - awful might happen 0  0 0  Total GAD 7 Score 0  0 0  Anxiety Difficulty Not difficult at all  Not difficult at all Not difficult at all       08/11/2024   10:17 AM 05/14/2024   10:45 AM 03/31/2024    9:37 AM  Depression screen PHQ 2/9  Decreased Interest 0 0 0  Down, Depressed, Hopeless 0 0 0  PHQ - 2 Score 0 0 0  Altered sleeping 0  0  Tired, decreased energy 0  1  Change in appetite 0  0  Feeling bad or failure about yourself  0  0  Trouble concentrating 0  0  Moving slowly or fidgety/restless 0  0  Suicidal thoughts  0  0  PHQ-9 Score 0  1  Difficult doing work/chores Not difficult at all  Not difficult at all    BP Readings from Last 3 Encounters:  08/11/24 128/78  07/11/24 (!) 158/79  06/14/24 (!) 174/94    Physical Exam Vitals and nursing note reviewed.  Constitutional:      General: He is not in acute distress.    Appearance: Normal appearance. He is well-developed. He is obese.  HENT:     Head: Normocephalic and atraumatic.  Cardiovascular:     Rate and Rhythm: Normal rate and regular rhythm.     Pulses: Normal pulses.     Heart sounds: No murmur heard. Pulmonary:     Effort: Pulmonary effort is normal. No  respiratory distress.     Breath sounds: No wheezing or rhonchi.  Musculoskeletal:     Cervical back: Normal range of motion.     Right lower leg: No edema.     Left lower leg: No edema.  Lymphadenopathy:     Cervical: No cervical adenopathy.  Skin:    General: Skin is warm and dry.     Findings: No rash.  Neurological:     General: No focal deficit present.     Mental Status: He is alert and oriented to person, place, and time.  Psychiatric:        Mood and Affect: Mood normal.        Behavior: Behavior normal.        Thought Content: Thought content normal.        Judgment: Judgment normal.     Wt Readings from Last 3 Encounters:  08/11/24 (!) 308 lb (139.7 kg)  06/14/24 (!) 304 lb (137.9 kg)  06/11/24 (!) 304 lb (137.9 kg)    BP 128/78   Pulse 67   Ht 6' 3 (1.905 m)   Wt (!) 308 lb (139.7 kg)   SpO2 98%   BMI 38.50 kg/m   Assessment and Plan:  Problem List Items Addressed This Visit       Unprioritized   Essential hypertension - Primary (Chronic)   Blood pressure is well controlled on lisinopril  today.  Recent readings have been elevated but improved today with repeat testing. No medication side effects noted. Plan to continue current medications.       Relevant Medications   lisinopril  (ZESTRIL ) 40 MG tablet   atorvastatin  (LIPITOR) 10 MG  tablet   DM type 2, controlled, with complication (HCC) (Chronic)   Blood sugars have been stable.  No hypoglycemic events since last visit. Currently medications are none. Last visit medical regimen changes were none.  He wanted to try Ozempic  which was approved but he could not afford the $250 copay. Lab Results  Component Value Date   HGBA1C 6.3 (H) 03/31/2024  A1C today = 6.1. Continue diet changes, exercise.         Relevant Medications   lisinopril  (ZESTRIL ) 40 MG tablet   atorvastatin  (LIPITOR) 10 MG tablet   Other Relevant Orders   POCT glycosylated hemoglobin (Hb A1C) (Completed)   Hyperlipidemia associated with type 2 diabetes mellitus (HCC) (Chronic)   LDL is  Lab Results  Component Value Date   LDLCALC 121 (H) 03/31/2024   Currently taking atorvastatin .  He notes general muscle aches for some time now. Recommended LDL goal is < 70. He can take Co-Q 10 daily supplement to help reduce myalgia.       Relevant Medications   lisinopril  (ZESTRIL ) 40 MG tablet   atorvastatin  (LIPITOR) 10 MG tablet    Return in about 4 months (around 12/11/2024) for DM, HTN.    Leita HILARIO Adie, MD [facility] [place]

## 2024-08-11 NOTE — Assessment & Plan Note (Signed)
 Blood pressure is well controlled on lisinopril  today.  Recent readings have been elevated but improved today with repeat testing. No medication side effects noted. Plan to continue current medications.

## 2024-08-11 NOTE — Assessment & Plan Note (Addendum)
 Blood sugars have been stable.  No hypoglycemic events since last visit. Currently medications are none. Last visit medical regimen changes were none.  He wanted to try Ozempic  which was approved but he could not afford the $250 copay. Lab Results  Component Value Date   HGBA1C 6.3 (H) 03/31/2024  A1C today = 6.1. Continue diet changes, exercise.

## 2024-08-11 NOTE — Patient Instructions (Signed)
 Try a supplement called Co-Q 10 to help counter the muscle effects of atorvastatin .

## 2024-08-12 DIAGNOSIS — K080 Exfoliation of teeth due to systemic causes: Secondary | ICD-10-CM | POA: Diagnosis not present

## 2024-09-22 ENCOUNTER — Encounter: Payer: Self-pay | Admitting: Emergency Medicine

## 2024-09-22 ENCOUNTER — Emergency Department

## 2024-09-22 ENCOUNTER — Other Ambulatory Visit: Payer: Self-pay

## 2024-09-22 ENCOUNTER — Inpatient Hospital Stay
Admission: EM | Admit: 2024-09-22 | Discharge: 2024-09-26 | DRG: 728 | Disposition: A | Discharge status: Home / Self Care | Attending: Internal Medicine | Admitting: Internal Medicine

## 2024-09-22 DIAGNOSIS — Z87891 Personal history of nicotine dependence: Secondary | ICD-10-CM | POA: Diagnosis not present

## 2024-09-22 DIAGNOSIS — D72829 Elevated white blood cell count, unspecified: Secondary | ICD-10-CM | POA: Diagnosis present

## 2024-09-22 DIAGNOSIS — Z800 Family history of malignant neoplasm of digestive organs: Secondary | ICD-10-CM

## 2024-09-22 DIAGNOSIS — R7989 Other specified abnormal findings of blood chemistry: Secondary | ICD-10-CM | POA: Diagnosis not present

## 2024-09-22 DIAGNOSIS — E66812 Obesity, class 2: Secondary | ICD-10-CM | POA: Diagnosis not present

## 2024-09-22 DIAGNOSIS — E785 Hyperlipidemia, unspecified: Secondary | ICD-10-CM | POA: Diagnosis not present

## 2024-09-22 DIAGNOSIS — I10 Essential (primary) hypertension: Secondary | ICD-10-CM | POA: Diagnosis present

## 2024-09-22 DIAGNOSIS — N410 Acute prostatitis: Principal | ICD-10-CM | POA: Diagnosis present

## 2024-09-22 DIAGNOSIS — R001 Bradycardia, unspecified: Secondary | ICD-10-CM | POA: Diagnosis present

## 2024-09-22 DIAGNOSIS — K219 Gastro-esophageal reflux disease without esophagitis: Secondary | ICD-10-CM | POA: Diagnosis not present

## 2024-09-22 DIAGNOSIS — B1920 Unspecified viral hepatitis C without hepatic coma: Secondary | ICD-10-CM | POA: Diagnosis present

## 2024-09-22 DIAGNOSIS — N2889 Other specified disorders of kidney and ureter: Secondary | ICD-10-CM | POA: Diagnosis not present

## 2024-09-22 DIAGNOSIS — K828 Other specified diseases of gallbladder: Secondary | ICD-10-CM | POA: Diagnosis not present

## 2024-09-22 DIAGNOSIS — Z8616 Personal history of COVID-19: Secondary | ICD-10-CM | POA: Diagnosis not present

## 2024-09-22 DIAGNOSIS — E872 Acidosis, unspecified: Secondary | ICD-10-CM | POA: Diagnosis not present

## 2024-09-22 DIAGNOSIS — K760 Fatty (change of) liver, not elsewhere classified: Secondary | ICD-10-CM | POA: Diagnosis present

## 2024-09-22 DIAGNOSIS — N401 Enlarged prostate with lower urinary tract symptoms: Secondary | ICD-10-CM | POA: Diagnosis not present

## 2024-09-22 DIAGNOSIS — I160 Hypertensive urgency: Secondary | ICD-10-CM | POA: Diagnosis present

## 2024-09-22 DIAGNOSIS — R079 Chest pain, unspecified: Secondary | ICD-10-CM | POA: Diagnosis not present

## 2024-09-22 DIAGNOSIS — E1165 Type 2 diabetes mellitus with hyperglycemia: Secondary | ICD-10-CM | POA: Diagnosis present

## 2024-09-22 DIAGNOSIS — N419 Inflammatory disease of prostate, unspecified: Secondary | ICD-10-CM | POA: Diagnosis not present

## 2024-09-22 DIAGNOSIS — N400 Enlarged prostate without lower urinary tract symptoms: Secondary | ICD-10-CM | POA: Diagnosis present

## 2024-09-22 DIAGNOSIS — Z79899 Other long term (current) drug therapy: Secondary | ICD-10-CM

## 2024-09-22 DIAGNOSIS — R109 Unspecified abdominal pain: Secondary | ICD-10-CM | POA: Diagnosis present

## 2024-09-22 DIAGNOSIS — Z807 Family history of other malignant neoplasms of lymphoid, hematopoietic and related tissues: Secondary | ICD-10-CM

## 2024-09-22 DIAGNOSIS — R1032 Left lower quadrant pain: Secondary | ICD-10-CM | POA: Diagnosis not present

## 2024-09-22 DIAGNOSIS — E118 Type 2 diabetes mellitus with unspecified complications: Secondary | ICD-10-CM | POA: Diagnosis present

## 2024-09-22 DIAGNOSIS — Z6837 Body mass index (BMI) 37.0-37.9, adult: Secondary | ICD-10-CM | POA: Diagnosis not present

## 2024-09-22 DIAGNOSIS — R1030 Lower abdominal pain, unspecified: Secondary | ICD-10-CM

## 2024-09-22 DIAGNOSIS — Z96651 Presence of right artificial knee joint: Secondary | ICD-10-CM | POA: Diagnosis not present

## 2024-09-22 DIAGNOSIS — N281 Cyst of kidney, acquired: Secondary | ICD-10-CM | POA: Diagnosis not present

## 2024-09-22 LAB — URINALYSIS, ROUTINE W REFLEX MICROSCOPIC
Bacteria, UA: NONE SEEN
Bilirubin Urine: NEGATIVE
Glucose, UA: 150 mg/dL — AB
Ketones, ur: 5 mg/dL — AB
Leukocytes,Ua: NEGATIVE
Nitrite: NEGATIVE
Protein, ur: 30 mg/dL — AB
Specific Gravity, Urine: 1.043 — ABNORMAL HIGH (ref 1.005–1.030)
pH: 6 (ref 5.0–8.0)

## 2024-09-22 LAB — BASIC METABOLIC PANEL WITH GFR
Anion gap: 12 (ref 5–15)
BUN: 15 mg/dL (ref 8–23)
CO2: 24 mmol/L (ref 22–32)
Calcium: 9.4 mg/dL (ref 8.9–10.3)
Chloride: 102 mmol/L (ref 98–111)
Creatinine, Ser: 1.08 mg/dL (ref 0.61–1.24)
GFR, Estimated: >60 mL/min (ref >60)
Glucose, Bld: 200 mg/dL — ABNORMAL HIGH (ref 70–99)
Potassium: 3.6 mmol/L (ref 3.5–5.1)
Sodium: 138 mmol/L (ref 135–145)

## 2024-09-22 LAB — HEPATIC FUNCTION PANEL
ALT: 182 U/L — ABNORMAL HIGH (ref 0–44)
AST: 301 U/L — ABNORMAL HIGH (ref 15–41)
Albumin: 4.3 g/dL (ref 3.5–5.0)
Alkaline Phosphatase: 101 U/L (ref 38–126)
Bilirubin, Direct: 0.4 mg/dL — ABNORMAL HIGH (ref 0.0–0.2)
Indirect Bilirubin: 0.5 mg/dL (ref 0.3–0.9)
Total Bilirubin: 0.9 mg/dL (ref 0.0–1.2)
Total Protein: 7.9 g/dL (ref 6.5–8.1)

## 2024-09-22 LAB — CBC
HCT: 45 % (ref 39.0–52.0)
Hemoglobin: 14.7 g/dL (ref 13.0–17.0)
MCH: 30.5 pg (ref 26.0–34.0)
MCHC: 32.7 g/dL (ref 30.0–36.0)
MCV: 93.4 fL (ref 80.0–100.0)
Platelets: 243 K/uL (ref 150–400)
RBC: 4.82 MIL/uL (ref 4.22–5.81)
RDW: 12.6 % (ref 11.5–15.5)
WBC: 16.6 K/uL — ABNORMAL HIGH (ref 4.0–10.5)
nRBC: 0 % (ref 0.0–0.2)

## 2024-09-22 LAB — LACTIC ACID, PLASMA
Lactic Acid, Venous: 2.9 mmol/L (ref 0.5–1.9)
Lactic Acid, Venous: 3 mmol/L (ref 0.5–1.9)

## 2024-09-22 LAB — LIPASE, BLOOD: Lipase: 18 U/L (ref 11–51)

## 2024-09-22 LAB — GLUCOSE, CAPILLARY: Glucose-Capillary: 150 mg/dL — ABNORMAL HIGH (ref 70–99)

## 2024-09-22 LAB — TROPONIN I (HIGH SENSITIVITY)
Troponin I (High Sensitivity): 5 ng/L (ref <18)
Troponin I (High Sensitivity): 5 ng/L (ref <18)

## 2024-09-22 MED ORDER — ONDANSETRON HCL 4 MG/2ML IJ SOLN
4.0000 mg | Freq: Four times a day (QID) | INTRAMUSCULAR | Status: DC | PRN
  Administered 2024-09-25 (×2): 4 mg via INTRAVENOUS
  Filled 2024-09-22 (×2): qty 2

## 2024-09-22 MED ORDER — SODIUM CHLORIDE 0.9 % IV BOLUS
1000.0000 mL | Freq: Once | INTRAVENOUS | Status: AC
  Administered 2024-09-22: 1000 mL via INTRAVENOUS

## 2024-09-22 MED ORDER — INSULIN ASPART 100 UNIT/ML IJ SOLN: 0.0000 [IU] | Freq: Every day | INTRAMUSCULAR | Status: DC

## 2024-09-22 MED ORDER — ATORVASTATIN CALCIUM 20 MG PO TABS
10.0000 mg | ORAL_TABLET | Freq: Every day | ORAL | Status: DC
Start: 2024-09-23 — End: 2024-09-25
  Administered 2024-09-23 – 2024-09-25 (×3): 10 mg via ORAL
  Filled 2024-09-22 (×4): qty 1

## 2024-09-22 MED ORDER — PIPERACILLIN-TAZOBACTAM 3.375 G IVPB
3.3750 g | Freq: Three times a day (TID) | INTRAVENOUS | Status: DC
  Administered 2024-09-23 – 2024-09-26 (×10): 3.375 g via INTRAVENOUS
  Filled 2024-09-22 (×10): qty 50

## 2024-09-22 MED ORDER — HYDRALAZINE HCL 20 MG/ML IJ SOLN
10.0000 mg | Freq: Once | INTRAMUSCULAR | Status: AC
  Administered 2024-09-22: 10 mg via INTRAVENOUS
  Filled 2024-09-22: qty 1

## 2024-09-22 MED ORDER — ENOXAPARIN SODIUM 80 MG/0.8ML IJ SOSY
0.5000 mg/kg | PREFILLED_SYRINGE | INTRAMUSCULAR | Status: DC
  Administered 2024-09-23 – 2024-09-25 (×4): 70 mg via SUBCUTANEOUS
  Filled 2024-09-22 (×5): qty 0.7

## 2024-09-22 MED ORDER — IOHEXOL 300 MG/ML  SOLN
100.0000 mL | Freq: Once | INTRAMUSCULAR | Status: AC | PRN
  Administered 2024-09-22: 100 mL via INTRAVENOUS

## 2024-09-22 MED ORDER — HYDROMORPHONE HCL 1 MG/ML IJ SOLN
1.0000 mg | Freq: Once | INTRAMUSCULAR | Status: AC
  Administered 2024-09-22: 1 mg via INTRAVENOUS
  Filled 2024-09-22 (×2): qty 1

## 2024-09-22 MED ORDER — ACETAMINOPHEN 650 MG RE SUPP: 650.0000 mg | Freq: Four times a day (QID) | RECTAL | Status: DC | PRN

## 2024-09-22 MED ORDER — METRONIDAZOLE 500 MG/100ML IV SOLN: 500.0000 mg | Freq: Two times a day (BID) | INTRAVENOUS | Status: DC

## 2024-09-22 MED ORDER — SODIUM CHLORIDE 0.9 % IV SOLN: INTRAVENOUS | Status: AC

## 2024-09-22 MED ORDER — ACETAMINOPHEN 325 MG PO TABS
650.0000 mg | ORAL_TABLET | Freq: Four times a day (QID) | ORAL | Status: DC | PRN
  Administered 2024-09-25: 650 mg via ORAL
  Filled 2024-09-22: qty 2

## 2024-09-22 MED ORDER — HYDRALAZINE HCL 20 MG/ML IJ SOLN
10.0000 mg | INTRAMUSCULAR | Status: DC | PRN
  Administered 2024-09-23 – 2024-09-26 (×3): 10 mg via INTRAVENOUS
  Filled 2024-09-22 (×3): qty 1

## 2024-09-22 MED ORDER — PIPERACILLIN-TAZOBACTAM 3.375 G IVPB 30 MIN
3.3750 g | Freq: Once | INTRAVENOUS | Status: AC
  Administered 2024-09-22: 3.375 g via INTRAVENOUS
  Filled 2024-09-22 (×2): qty 50

## 2024-09-22 MED ORDER — ONDANSETRON HCL 4 MG PO TABS: 4.0000 mg | ORAL_TABLET | Freq: Four times a day (QID) | ORAL | Status: DC | PRN

## 2024-09-22 MED ORDER — LISINOPRIL 10 MG PO TABS
40.0000 mg | ORAL_TABLET | Freq: Every day | ORAL | Status: DC
  Administered 2024-09-23 – 2024-09-26 (×4): 40 mg via ORAL
  Filled 2024-09-22 (×5): qty 4

## 2024-09-22 MED ORDER — HYDRALAZINE HCL 20 MG/ML IJ SOLN
5.0000 mg | Freq: Once | INTRAMUSCULAR | Status: AC
Start: 2024-09-22 — End: 2024-09-22
  Administered 2024-09-22: 5 mg via INTRAVENOUS
  Filled 2024-09-22: qty 1

## 2024-09-22 MED ORDER — ONDANSETRON HCL 4 MG/2ML IJ SOLN
4.0000 mg | Freq: Once | INTRAMUSCULAR | Status: AC
  Administered 2024-09-22: 4 mg via INTRAVENOUS
  Filled 2024-09-22: qty 2

## 2024-09-22 MED ORDER — MORPHINE SULFATE (PF) 2 MG/ML IV SOLN
2.0000 mg | INTRAVENOUS | Status: DC | PRN
  Administered 2024-09-22 – 2024-09-23 (×2): 2 mg via INTRAVENOUS
  Filled 2024-09-22 (×3): qty 1

## 2024-09-22 MED ORDER — HYDROMORPHONE HCL 1 MG/ML IJ SOLN
1.0000 mg | Freq: Once | INTRAMUSCULAR | Status: AC
  Administered 2024-09-22: 1 mg via INTRAVENOUS
  Filled 2024-09-22: qty 1

## 2024-09-22 MED ORDER — INSULIN ASPART 100 UNIT/ML IJ SOLN
0.0000 [IU] | Freq: Three times a day (TID) | INTRAMUSCULAR | Status: DC
  Administered 2024-09-23: 2 [IU] via SUBCUTANEOUS
  Administered 2024-09-23: 3 [IU] via SUBCUTANEOUS
  Administered 2024-09-23: 2 [IU] via SUBCUTANEOUS
  Administered 2024-09-24: 3 [IU] via SUBCUTANEOUS
  Filled 2024-09-22 (×4): qty 1

## 2024-09-22 MED ORDER — HYDROCODONE-ACETAMINOPHEN 5-325 MG PO TABS
1.0000 | ORAL_TABLET | ORAL | Status: DC | PRN
  Administered 2024-09-23 (×3): 2 via ORAL
  Administered 2024-09-23: 1 via ORAL
  Administered 2024-09-24 (×2): 2 via ORAL
  Administered 2024-09-24: 1 via ORAL
  Administered 2024-09-24 – 2024-09-25 (×3): 2 via ORAL
  Filled 2024-09-22 (×3): qty 2
  Filled 2024-09-22: qty 1
  Filled 2024-09-22 (×7): qty 2

## 2024-09-22 NOTE — ED Triage Notes (Signed)
 Arrives from home via [facility] C/O lower abd pain. VS wnl.

## 2024-09-22 NOTE — Assessment & Plan Note (Signed)
 Sliding scale insulin  coverage

## 2024-09-22 NOTE — Assessment & Plan Note (Signed)
 Complicating factor to overall prognosis and care

## 2024-09-22 NOTE — ED Provider Notes (Signed)
 [facility] Provider Note    Event Date/Time   First MD Initiated Contact with Patient 09/22/24 1641     (approximate)   History   Abdominal Pain   HPI  Benjamin Medina is a 73 y.o. male with a history of hypertension, diabetes, and hyperlipidemia who presents with abdominal pain, acute onset about 6 hours ago, mainly in left lower quadrant, associated with some lower chest/epigastric pain, nausea, and 1 episode of vomiting after coming to the ED.  The patient also had a loose bowel movement earlier today but has not had any other diarrhea.  He reports some pain going down the left arm.  He states that he was in his usual state of health until this morning, although had some heartburn over the last couple of days which is unusual for him.  I reviewed the past medical records.  The patient's most recent outpatient encounter was on 8/13 with Dr. Justus from internal medicine for follow-up of his chronic conditions.   Physical Exam   Triage Vital Signs: ED Triage Vitals  Encounter Vitals Group     BP 09/22/24 1506 (!) 143/84     Girls Systolic BP Percentile --      Girls Diastolic BP Percentile --      Boys Systolic BP Percentile --      Boys Diastolic BP Percentile --      Pulse Rate 09/22/24 1506 62     Resp 09/22/24 1506 17     Temp 09/22/24 1506 98 F (36.7 C)     Temp Source 09/22/24 1506 Oral     SpO2 09/22/24 1506 100 %     Weight 09/22/24 1507 (!) 310 lb (140.6 kg)     Height 09/22/24 1507 6' 4 (1.93 m)     Head Circumference --      Peak Flow --      Pain Score 09/22/24 1507 6     Pain Loc --      Pain Education --      Exclude from Growth Chart --     Most recent vital signs: Vitals:   09/22/24 2115 09/22/24 2130  BP:  (!) 208/109  Pulse: 66 78  Resp: 16   Temp:    SpO2: 99% 98%    General: Alert, uncomfortable appearing, no acute distress.  CV:  Good peripheral perfusion.  Resp:  Normal effort.  Lungs CTAB. Abd:  Soft  with mild left lower quadrant tenderness.  No distention.  Other:  No jaundice or scleral icterus.   ED Results / Procedures / Treatments   Labs (all labs ordered are listed, but only abnormal results are displayed) Labs Reviewed  BASIC METABOLIC PANEL WITH GFR - Abnormal; Notable for the following components:      Result Value   Glucose, Bld 200 (*)    All other components within normal limits  CBC - Abnormal; Notable for the following components:   WBC 16.6 (*)    All other components within normal limits  HEPATIC FUNCTION PANEL - Abnormal; Notable for the following components:   AST 301 (*)    ALT 182 (*)    Bilirubin, Direct 0.4 (*)    All other components within normal limits  LACTIC ACID, PLASMA - Abnormal; Notable for the following components:   Lactic Acid, Venous 2.9 (*)    All other components within normal limits  LACTIC ACID, PLASMA - Abnormal; Notable for the following components:   Lactic  Acid, Venous 3.0 (*)    All other components within normal limits  URINALYSIS, ROUTINE W REFLEX MICROSCOPIC - Abnormal; Notable for the following components:   Color, Urine YELLOW (*)    APPearance CLEAR (*)    Specific Gravity, Urine 1.043 (*)    Glucose, UA 150 (*)    Hgb urine dipstick SMALL (*)    Ketones, ur 5 (*)    Protein, ur 30 (*)    All other components within normal limits  CULTURE, BLOOD (ROUTINE X 2)  CULTURE, BLOOD (ROUTINE X 2)  LIPASE, BLOOD  COMPREHENSIVE METABOLIC PANEL WITH GFR  CBC  TROPONIN I (HIGH SENSITIVITY)  TROPONIN I (HIGH SENSITIVITY)     EKG  ED ECG REPORT I, Waylon Cassis, the attending physician, personally viewed and interpreted this ECG.  Date: 09/22/2024 EKG Time: 1514 Rate: 56 Rhythm: normal sinus rhythm QRS Axis: normal Intervals: normal ST/T Wave abnormalities: Nonspecific ST abnormality Narrative Interpretation: no evidence of acute ischemia    RADIOLOGY  Chest x-ray: I independently viewed and interpreted  images; there is no focal consolidation or edema  CT abdomen/pelvis:  IMPRESSION:  1. Postprocedural changes involving the prostate. Associated mild stranding,  possibly reflecting prostatitis.  2. Mildly thick-walled bladder, equivocal. Correlate for cystitis.  3. Distended gallbladder, without cholelithiasis or inflammatory changes.   US  abdomen RUQ:  IMPRESSION:  Fatty infiltration of the liver.    No acute findings.     PROCEDURES:  Critical Care performed: No  Procedures   MEDICATIONS ORDERED IN ED: Medications  piperacillin -tazobactam (ZOSYN ) IVPB 3.375 g (3.375 g Intravenous New Bag/Given 09/22/24 2241)  atorvastatin  (LIPITOR) tablet 10 mg (has no administration in time range)  lisinopril  (ZESTRIL ) tablet 40 mg (has no administration in time range)  enoxaparin  (LOVENOX ) injection 40 mg (has no administration in time range)  acetaminophen  (TYLENOL ) tablet 650 mg (has no administration in time range)    Or  acetaminophen  (TYLENOL ) suppository 650 mg (has no administration in time range)  ondansetron  (ZOFRAN ) tablet 4 mg (has no administration in time range)    Or  ondansetron  (ZOFRAN ) injection 4 mg (has no administration in time range)  insulin  aspart (novoLOG ) injection 0-15 Units (has no administration in time range)  insulin  aspart (novoLOG ) injection 0-5 Units (has no administration in time range)  0.9 %  sodium chloride  infusion (has no administration in time range)  HYDROcodone -acetaminophen  (NORCO/VICODIN) 5-325 MG per tablet 1-2 tablet (has no administration in time range)  morphine  (PF) 2 MG/ML injection 2 mg (has no administration in time range)  hydrALAZINE  (APRESOLINE ) injection 10 mg (has no administration in time range)  ondansetron  (ZOFRAN ) injection 4 mg (4 mg Intravenous Given 09/22/24 1700)  HYDROmorphone  (DILAUDID ) injection 1 mg (1 mg Intravenous Given 09/22/24 1713)  sodium chloride  0.9 % bolus 1,000 mL (0 mLs Intravenous Stopped 09/22/24 2048)   iohexol  (OMNIPAQUE ) 300 MG/ML solution 100 mL (100 mLs Intravenous Contrast Given 09/22/24 1834)  HYDROmorphone  (DILAUDID ) injection 1 mg (1 mg Intravenous Given 09/22/24 1925)  hydrALAZINE  (APRESOLINE ) injection 5 mg (5 mg Intravenous Given 09/22/24 2045)  sodium chloride  0.9 % bolus 1,000 mL (1,000 mLs Intravenous New Bag/Given 09/22/24 2241)  hydrALAZINE  (APRESOLINE ) injection 10 mg (10 mg Intravenous Given 09/22/24 2238)     IMPRESSION / MDM / ASSESSMENT AND PLAN / ED COURSE  I reviewed the triage vital signs and the nursing notes.  73 year old male with PMH as noted above presents with lower abdominal pain, some epigastric/chest pain, nausea and vomiting since the  late morning today.  On exam the patient is very uncomfortable appearing but vital signs are normal.  He has mild left lower quadrant tenderness.  Differential diagnosis includes, but is not limited to, diverticulitis, infectious or ischemic colitis, gastroenteritis, SBO, volvulus, less likely primary cardiac etiology.  I have a low suspicion for ruptured AAA or other vascular etiology.  We will obtain lab workup, CT abdomen/pelvis, give fluids, antiemetic, analgesia, and reassess.  Patient's presentation is most consistent with acute presentation with potential threat to life or bodily function.  The patient is on the cardiac monitor to evaluate for evidence of arrhythmia and/or significant heart rate changes.  ----------------------------------------- 9:02 PM on 09/22/2024 -----------------------------------------  CBC shows leukocytosis.  LFTs are slightly elevated.  Lactate is elevated.  CT shows possible prostatitis and possible cystitis, although the urinalysis is not really consistent with a urinary source.  The gallbladder is distended, so I have ordered an ultrasound to further evaluate.  The patient's pain is improved and he appears more comfortable.  Blood pressure has been elevated so I have ordered IV hydralazine .   There continues to be no evidence of a vascular etiology.  There is no AAA and no evidence of vessel occlusion or ischemic colitis on the CT.  ----------------------------------------- 10:41 PM on 09/22/2024 -----------------------------------------  Blood pressure still elevated so I have ordered an additional dose of hydralazine .  Gallbladder shows no acute findings on ultrasound.  Repeat lactate did not improve site for additional fluids.  I ordered Zosyn  to cover both for prostatitis and other possible intra-abdominal sources.  The patient continues to be relatively comfortable appearing.  He will need admission for further management.  I consulted Dr. Cleatus from the hospitalist service; based on our discussion she agrees to evaluate the patient for admission.   FINAL CLINICAL IMPRESSION(S) / ED DIAGNOSES   Final diagnoses:  Acute prostatitis  Lower abdominal pain  Hypertension, unspecified type     Rx / DC Orders   ED Discharge Orders     None        Note:  This document was prepared using Dragon voice recognition software and may include unintentional dictation errors.    Jacolyn Pae, MD 09/22/24 2243

## 2024-09-22 NOTE — Assessment & Plan Note (Signed)
 Continue home medication of lisinopril .  Will hold hydrochlorothiazide  Hydralazine  as needed for additional control

## 2024-09-22 NOTE — H&P (Signed)
 History and Physical    Patient: Benjamin Medina FMW:[id number] DOB: April 17, 1951 DOA: 09/22/2024 DOS: the patient was seen and examined on 09/22/2024 PCP: Justus Leita DEL, MD  Patient coming from: Home  Chief Complaint:  Chief Complaint  Patient presents with   Abdominal Pain    HPI: Benjamin Medina is a 73 y.o. male with medical history significant for DM, HTN, BPH, morbid obesity being admitted with abdominal pain of uncertain etiology, possible prostatitis.  He presented with sudden onset left lower quadrant pain starting around 11 AM on 09/22/2024 associated with nonbloody nonbilious vomiting.  He denies change in bowel habits or dysuria and has no cough fever or chills. In the ED he was hypertensive to as high as 209/113 with otherwise normal vitals. Labs concerning for Lactic acidnotable for leukocytosis of 16,000 with 2.9--3.  BMP unremarkable, lipase normal, hepatic function panel notable for AST 301 and ALT 182 with normal bilirubin.  Troponin 5.  Urinalysis with ketones but noninfectious. EKG showed sinus bradycardia at 56. Chest x-ray nonacute CT abdomen and pelvis with contrast notable for possible prostatitis/cystitis and distended gallbladder without cholelithiasis or inflammatory changes. Follow-up RUQ ultrasound showing fatty infiltration of the liver  Patient treated with Dilaudid , Zofran , NS bolus started on Zosyn  for possible urinary source and an NS bolus and also treated for elevated BP with hydralazine .  Admission requested.     Review of Systems: As mentioned in the history of present illness. All other systems reviewed and are negative.  Past Medical History:  Diagnosis Date   Acute DM type 2 causing complication (HCC)    Arthritis    Auditory impairment    BPH (benign prostatic hyperplasia)    BPH with urinary obstruction    Contracture of hand    PALMAR FASCIA   COVID-19 04/14/2024   Dyspnea    since having covid in april 2025   History of hepatitis  C    treated back in 2020   Hyperlipidemia    Hypertension    Psoriasis    Past Surgical History:  Procedure Laterality Date   BLEPHAROPLASTY Bilateral 12/30/2014   COLONOSCOPY WITH PROPOFOL  N/A 05/01/2016   Procedure: COLONOSCOPY WITH PROPOFOL ;  Surgeon: Reyes LELON Cota, MD;  Location: [facility];  Service: Endoscopy;  Laterality: N/A;   HOLEP-LASER ENUCLEATION OF THE PROSTATE WITH MORCELLATION N/A 06/11/2024   Procedure: ENUCLEATION, PROSTATE, USING LASER, WITH MORCELLATION;  Surgeon: Francisca Redell BROCKS, MD;  Location: [facility];  Service: Urology;  Laterality: N/A;   KNEE ARTHROSCOPY Left 01/14/2019   Procedure: ARTHROSCOPY KNEE WITH DEBRIDEMENT AND PARTIAL MEDIAL MENISCECTOMY;  Surgeon: Edie Norleen PARAS, MD;  Location: [facility];  Service: Orthopedics;  Laterality: Left;   KNEE ARTHROSCOPY WITH MEDIAL MENISECTOMY Right 11/27/2016   Procedure: KNEE ARTHROSCOPY WITH MEDIAL MENISECTOMY;  Surgeon: Norleen PARAS Edie, MD;  Location: [facility];  Service: Orthopedics;  Laterality: Right;   PROSTATE SURGERY  06/11/2024   SINUS SURGERY WITH INSTATRAK     TOTAL KNEE ARTHROPLASTY Right 11/28/2022   Procedure: TOTAL KNEE ARTHROPLASTY;  Surgeon: Edie Norleen PARAS, MD;  Location: [facility];  Service: Orthopedics;  Laterality: Right;   VASECTOMY     Social History:  reports that he quit smoking about 38 years ago. His smoking use included cigarettes. He has never used smokeless tobacco. He reports that he does not currently use alcohol after a past usage of about 4.0 standard drinks of alcohol per week. He reports that he does not use drugs.  No Known Allergies  Family History  Problem Relation Age of Onset   Colon cancer Mother 97   Lymphoma Father     Prior to Admission medications   Medication Sig Start Date End Date Taking? Authorizing Provider  atorvastatin  (LIPITOR) 10 MG tablet TAKE 1 TABLET(10 MG) BY MOUTH DAILY 08/11/24   Berglund, Laura H, MD  fluticasone (FLONASE) 50 MCG/ACT nasal  spray Place 1 spray into both nostrils daily as needed for allergies or rhinitis.    [provider]  hydrochlorothiazide  (MICROZIDE ) 12.5 MG capsule TAKE 1 CAPSULE(12.5 MG) BY MOUTH DAILY Patient not taking: Reported on 08/11/2024 03/01/24   Justus Leita DEL, MD  lisinopril  (ZESTRIL ) 40 MG tablet Take 1 tablet (40 mg total) by mouth daily. 08/11/24   Justus Leita DEL, MD  terazosin  (HYTRIN ) 1 MG capsule Take 1 mg by mouth at bedtime. Patient not taking: Reported on 08/11/2024    [provider]    Physical Exam: Vitals:   09/22/24 1930 09/22/24 2035 09/22/24 2115 09/22/24 2130  BP: (!) 201/93 (!) 209/113  (!) 208/109  Pulse: 84 82 66 78  Resp: (!) 22 20 16    Temp: 98.1 F (36.7 C)     TempSrc: Oral     SpO2: 96% 98% 99% 98%  Weight:      Height:       Physical Exam Vitals and nursing note reviewed.  Constitutional:      General: He is not in acute distress. HENT:     Head: Normocephalic and atraumatic.  Cardiovascular:     Rate and Rhythm: Normal rate and regular rhythm.     Heart sounds: Normal heart sounds.  Pulmonary:     Effort: Pulmonary effort is normal.     Breath sounds: Normal breath sounds.  Abdominal:     Palpations: Abdomen is soft.     Tenderness: There is abdominal tenderness in the suprapubic area.  Neurological:     Mental Status: Mental status is at baseline.     Labs on Admission: I have personally reviewed following labs and imaging studies  CBC: Recent Labs  Lab 09/22/24 1509  WBC 16.6*  HGB 14.7  HCT 45.0  MCV 93.4  PLT 243   Basic Metabolic Panel: Recent Labs  Lab 09/22/24 1509  NA 138  K 3.6  CL 102  CO2 24  GLUCOSE 200*  BUN 15  CREATININE 1.08  CALCIUM  9.4   GFR: Estimated Creatinine Clearance: 94.7 mL/min (by C-G formula based on SCr of 1.08 mg/dL). Liver Function Tests: Recent Labs  Lab 09/22/24 1816  AST 301*  ALT 182*  ALKPHOS 101  BILITOT 0.9  PROT 7.9  ALBUMIN 4.3   Recent Labs  Lab  09/22/24 1826  LIPASE 18   No results for input(s): AMMONIA in the last 168 hours. Coagulation Profile: No results for input(s): INR, PROTIME in the last 168 hours. Cardiac Enzymes: No results for input(s): CKTOTAL, CKMB, CKMBINDEX, TROPONINI in the last 168 hours. BNP (last 3 results) No results for input(s): PROBNP in the last 8760 hours. HbA1C: No results for input(s): HGBA1C in the last 72 hours. CBG: No results for input(s): GLUCAP in the last 168 hours. Lipid Profile: No results for input(s): CHOL, HDL, LDLCALC, TRIG, CHOLHDL, LDLDIRECT in the last 72 hours. Thyroid  Function Tests: No results for input(s): TSH, T4TOTAL, FREET4, T3FREE, THYROIDAB in the last 72 hours. Anemia Panel: No results for input(s): VITAMINB12, FOLATE, FERRITIN, TIBC, IRON, RETICCTPCT in the last 72 hours.  Urine analysis:    Component Value Date/Time   COLORURINE YELLOW (A) 09/22/2024 1816   APPEARANCEUR CLEAR (A) 09/22/2024 1816   APPEARANCEUR Clear 06/04/2024 1020   LABSPEC 1.043 (H) 09/22/2024 1816   LABSPEC 1.010 09/15/2012 1023   PHURINE 6.0 09/22/2024 1816   GLUCOSEU 150 (A) 09/22/2024 1816   GLUCOSEU NEGATIVE 09/15/2012 1023   HGBUR SMALL (A) 09/22/2024 1816   BILIRUBINUR NEGATIVE 09/22/2024 1816   BILIRUBINUR Negative 06/04/2024 1020   KETONESUR 5 (A) 09/22/2024 1816   PROTEINUR 30 (A) 09/22/2024 1816   UROBILINOGEN 0.2 03/28/2022 0904   NITRITE NEGATIVE 09/22/2024 1816   LEUKOCYTESUR NEGATIVE 09/22/2024 1816    Radiological Exams on Admission: US  Abdomen Limited RUQ (LIVER/GB) Result Date: 09/22/2024 CLINICAL DATA:  Elevated LFTs EXAM: ULTRASOUND ABDOMEN LIMITED RIGHT UPPER QUADRANT COMPARISON:  None Available. FINDINGS: Gallbladder: Dilated gallbladder. No wall thickening or sonographic Murphy sign. No visible stones. Common bile duct: Diameter: Normal caliber, 5 mm Liver: Increased echotexture compatible with fatty infiltration.  No focal abnormality or biliary ductal dilatation. Portal vein is patent on color Doppler imaging with normal direction of blood flow towards the liver. Other: None. IMPRESSION: Fatty infiltration of the liver. No acute findings. Electronically Signed   By: Franky Crease M.D.   On: 09/22/2024 21:52   CT ABDOMEN PELVIS W CONTRAST Result Date: 09/22/2024 EXAM: CT ABDOMEN AND PELVIS WITH CONTRAST 09/22/2024 06:59:29 PM TECHNIQUE: CT of the abdomen and pelvis was performed with the administration of 100 mL of iohexol  (OMNIPAQUE ) 300 MG/ML solution. Multiplanar reformatted images are provided for review. Automated exposure control, iterative reconstruction, and/or weight-based adjustment of the mA/kV was utilized to reduce the radiation dose to as low as reasonably achievable. COMPARISON: CT abdomen dated 02/13/2007. CLINICAL HISTORY: LLQ abdominal pain. Patient to ED via POV for left lower abd pain. Started this AM with nausea. States hot/cold chills and left arm pain. States he did have dizziness upon standing. Also states some central chest pain. Hx HTN. FINDINGS: LOWER CHEST: Mild scarring/ atelectasis at the lung bases. LIVER: The liver is unremarkable. GALLBLADDER AND BILE DUCTS: Distended gallbladder, without cholelithiasis or pericholecystic inflammatory changes. No biliary ductal dilatation. SPLEEN: No acute abnormality. PANCREAS: No acute abnormality. ADRENAL GLANDS: No acute abnormality. KIDNEYS, URETERS AND BLADDER: Small simple bilateral renal cysts, measuring up to 2.2 cm in the right lower kidney (image 46), benign (Bosniak 1). Additional 2.0 cm calcified cyst in the left upper kidney (image 38), benign (Bosniak 2). No follow up is recommended. No stones in the kidneys or ureters. No hydronephrosis. No perinephric or periureteral stranding. Mildly thick walled bladder with small right posterolateral bladder diverticulum (image 92) with very mild perivesical stranding (image 89) possibly reflecting  cystitis. GI AND BOWEL: Stomach demonstrates no acute abnormality. Normal appendix (image 71). Mild left colonic diverticulosis without evidence of diverticulitis. There is no bowel obstruction. PERITONEUM AND RETROPERITONEUM: No ascites. No free air. VASCULATURE: Aorta is normal in caliber. Atherosclerotic calcifications of the abdominal aorta and branch vessels, although patent. LYMPH NODES: No lymphadenopathy. REPRODUCTIVE ORGANS: Postprocedural changes involving the prostate with mild prostatic stranding (image 100), correlate for prostatitis. BONES AND SOFT TISSUES: Mild degenerative changes of the visualized thoracolumbar spine. Tiny fat containing bilateral inguinal hernias. No acute osseous abnormality. IMPRESSION: 1. Postprocedural changes involving the prostate. Associated mild stranding, possibly reflecting prostatitis. 2. Mildly thick-walled bladder, equivocal. Correlate for cystitis. 3. Distended gallbladder, without cholelithiasis or inflammatory changes. Electronically signed by: Pinkie Pebbles MD 09/22/2024 07:24 PM EDT RP Workstation: [id number]  DG Chest 2 View Result Date: 09/22/2024 CLINICAL DATA:  Chest pain EXAM: CHEST - 2 VIEW COMPARISON:  Chest x-ray 05/14/2024 FINDINGS: The heart size and mediastinal contours are within normal limits. Both lungs are clear. The visualized skeletal structures are unremarkable. IMPRESSION: No active cardiopulmonary disease. Electronically Signed   By: Greig Pique M.D.   On: 09/22/2024 15:33   Data Reviewed for HPI: Relevant notes from primary care and specialist visits, past discharge summaries as available in EHR, including Care Everywhere. Prior diagnostic testing as pertinent to current admission diagnoses Updated medications and problem lists for reconciliation ED course, including vitals, labs, imaging, treatment and response to treatment Triage notes, nursing and pharmacy notes and ED provider's notes Notable results as noted above in  HPI      Assessment and Plan: * Abdominal pain Possible prostatitis Elevated LFTs/fatty infiltration of the liver Patient presents with sudden onset abdominal pain and vomiting however workup mostly unrevealing Contrasted CT abdomen and pelvis without bowel abnormalities and RUQ ultrasound showing fatty infiltration of the liver Supportive care with IV pain medicine, IV antiemetics, IV hydration Will treat with Zosyn  and Flagyl  for possible genitourinary and GI source Follow cultures Can consider surgical consult if persistent symptoms  Lactic acidosis Not quite meeting sepsis criteria, likely related to GI etiology Monitor for  improvement with hydration Continue to trend  Elevated LFTs Fatty infiltration on RUQ ultrasound Monitor for improvement with hydration  Hypertensive urgency Continue home medication of lisinopril .  Will hold hydrochlorothiazide  Hydralazine  as needed for additional control  Obesity, morbid (HCC) Complicating factor to overall prognosis and care  DM type 2, controlled, with complication (HCC) Sliding scale insulin  coverage  BPH (benign prostatic hyperplasia) Continue home meds pending med rec     DVT prophylaxis: Lovenox   Consults: none  Advance Care Planning:   Code Status: Prior   Family Communication: none  Disposition Plan: Back to previous home environment  Severity of Illness: The appropriate patient status for this patient is OBSERVATION. Observation status is judged to be reasonable and necessary in order to provide the required intensity of service to ensure the patient's safety. The patient's presenting symptoms, physical exam findings, and initial radiographic and laboratory data in the context of their medical condition is felt to place them at decreased risk for further clinical deterioration. Furthermore, it is anticipated that the patient will be medically stable for discharge from the hospital within 2 midnights of  admission.   Author: Delayne LULLA Solian, MD 09/22/2024 10:39 PM  For on call review [email or web address].

## 2024-09-22 NOTE — Progress Notes (Signed)
 Pharmacy Antibiotic Note  Benjamin Medina is a 73 y.o. male admitted on 09/22/2024 with intra-abdominal.  Pharmacy has been consulted for Zosyn  dosing.  Plan: Zosyn  3.375g IV q8h (4 hour infusion).  Height: 6' 4 (193 cm) Weight: (!) 140.6 kg (310 lb) IBW/kg (Calculated) : 86.8  Temp (24hrs), Avg:98.1 F (36.7 C), Min:98 F (36.7 C), Max:98.1 F (36.7 C)  Recent Labs  Lab 09/22/24 1509 09/22/24 1816 09/22/24 2101  WBC 16.6*  --   --   CREATININE 1.08  --   --   LATICACIDVEN  --  2.9* 3.0*    Estimated Creatinine Clearance: 94.7 mL/min (by C-G formula based on SCr of 1.08 mg/dL).    No Known Allergies  Antimicrobials this admission:   >>    >>   Dose adjustments this admission:   Microbiology results:  BCx:   UCx:    Sputum:    MRSA PCR:   Thank you for allowing pharmacy to be a part of this patient's care.  Brandin Stetzer D 09/22/2024 11:02 PM

## 2024-09-22 NOTE — Assessment & Plan Note (Signed)
 Not quite meeting sepsis criteria, likely related to GI etiology Monitor for  improvement with hydration Continue to trend

## 2024-09-22 NOTE — ED Triage Notes (Signed)
 Patient to ED via POV for left lower abd pain. Started this AM with nausea. States hot/cold chills and left arm pain. States he did have dizziness upon standing. Also states some central chest pain. Hx HTN

## 2024-09-22 NOTE — Assessment & Plan Note (Signed)
 Fatty infiltration on RUQ ultrasound Monitor for improvement with hydration

## 2024-09-22 NOTE — Assessment & Plan Note (Addendum)
 Possible prostatitis Elevated LFTs/fatty infiltration of the liver Patient presents with sudden onset abdominal pain and vomiting however workup mostly unrevealing Contrasted CT abdomen and pelvis without bowel abnormalities and RUQ ultrasound showing fatty infiltration of the liver Supportive care with IV pain medicine, IV antiemetics, IV hydration Will treat with Zosyn  and Flagyl  for possible genitourinary and GI source Follow cultures Can consider surgical consult if persistent symptoms

## 2024-09-22 NOTE — Assessment & Plan Note (Signed)
Continue home meds pending med rec 

## 2024-09-22 NOTE — Progress Notes (Signed)
 Anticoagulation monitoring(Lovenox ):  73 yo male ordered Lovenox  40 mg Q24h    Filed Weights   09/22/24 1507  Weight: (!) 140.6 kg (310 lb)   BMI 37.7    Lab Results  Component Value Date   CREATININE 1.08 09/22/2024   CREATININE 0.93 07/11/2024   CREATININE 1.01 06/04/2024   Estimated Creatinine Clearance: 94.7 mL/min (by C-G formula based on SCr of 1.08 mg/dL). Hemoglobin & Hematocrit     Component Value Date/Time   HGB 14.7 09/22/2024 1509   HGB 15.3 03/31/2024 1022   HCT 45.0 09/22/2024 1509   HCT 46.2 03/31/2024 1022     Per Protocol for Patient with estCrcl > 30 ml/min and BMI > 30, will transition to Lovenox  70 mg Q24h.

## 2024-09-23 DIAGNOSIS — R1032 Left lower quadrant pain: Secondary | ICD-10-CM | POA: Diagnosis present

## 2024-09-23 DIAGNOSIS — R001 Bradycardia, unspecified: Secondary | ICD-10-CM | POA: Diagnosis present

## 2024-09-23 DIAGNOSIS — E785 Hyperlipidemia, unspecified: Secondary | ICD-10-CM | POA: Diagnosis present

## 2024-09-23 DIAGNOSIS — K760 Fatty (change of) liver, not elsewhere classified: Secondary | ICD-10-CM | POA: Diagnosis present

## 2024-09-23 DIAGNOSIS — E872 Acidosis, unspecified: Secondary | ICD-10-CM | POA: Diagnosis present

## 2024-09-23 DIAGNOSIS — R1084 Generalized abdominal pain: Secondary | ICD-10-CM | POA: Diagnosis not present

## 2024-09-23 DIAGNOSIS — E66812 Obesity, class 2: Secondary | ICD-10-CM | POA: Diagnosis present

## 2024-09-23 DIAGNOSIS — R7989 Other specified abnormal findings of blood chemistry: Secondary | ICD-10-CM | POA: Diagnosis present

## 2024-09-23 DIAGNOSIS — N410 Acute prostatitis: Secondary | ICD-10-CM | POA: Diagnosis present

## 2024-09-23 DIAGNOSIS — Z8616 Personal history of COVID-19: Secondary | ICD-10-CM | POA: Diagnosis not present

## 2024-09-23 DIAGNOSIS — Z96651 Presence of right artificial knee joint: Secondary | ICD-10-CM | POA: Diagnosis present

## 2024-09-23 DIAGNOSIS — Z79899 Other long term (current) drug therapy: Secondary | ICD-10-CM | POA: Diagnosis not present

## 2024-09-23 DIAGNOSIS — Z800 Family history of malignant neoplasm of digestive organs: Secondary | ICD-10-CM | POA: Diagnosis not present

## 2024-09-23 DIAGNOSIS — I160 Hypertensive urgency: Secondary | ICD-10-CM | POA: Diagnosis present

## 2024-09-23 DIAGNOSIS — Z807 Family history of other malignant neoplasms of lymphoid, hematopoietic and related tissues: Secondary | ICD-10-CM | POA: Diagnosis not present

## 2024-09-23 DIAGNOSIS — Z6837 Body mass index (BMI) 37.0-37.9, adult: Secondary | ICD-10-CM | POA: Diagnosis not present

## 2024-09-23 DIAGNOSIS — I10 Essential (primary) hypertension: Secondary | ICD-10-CM | POA: Diagnosis present

## 2024-09-23 DIAGNOSIS — N401 Enlarged prostate with lower urinary tract symptoms: Secondary | ICD-10-CM | POA: Diagnosis present

## 2024-09-23 DIAGNOSIS — K219 Gastro-esophageal reflux disease without esophagitis: Secondary | ICD-10-CM | POA: Diagnosis present

## 2024-09-23 DIAGNOSIS — Z87891 Personal history of nicotine dependence: Secondary | ICD-10-CM | POA: Diagnosis not present

## 2024-09-23 DIAGNOSIS — D72829 Elevated white blood cell count, unspecified: Secondary | ICD-10-CM | POA: Diagnosis present

## 2024-09-23 DIAGNOSIS — B1920 Unspecified viral hepatitis C without hepatic coma: Secondary | ICD-10-CM | POA: Diagnosis present

## 2024-09-23 DIAGNOSIS — E1165 Type 2 diabetes mellitus with hyperglycemia: Secondary | ICD-10-CM | POA: Diagnosis present

## 2024-09-23 LAB — CBC
HCT: 41.3 % (ref 39.0–52.0)
Hemoglobin: 14.1 g/dL (ref 13.0–17.0)
MCH: 31.3 pg (ref 26.0–34.0)
MCHC: 34.1 g/dL (ref 30.0–36.0)
MCV: 91.8 fL (ref 80.0–100.0)
Platelets: 237 K/uL (ref 150–400)
RBC: 4.5 MIL/uL (ref 4.22–5.81)
RDW: 13.1 % (ref 11.5–15.5)
WBC: 15.6 K/uL — ABNORMAL HIGH (ref 4.0–10.5)
nRBC: 0 % (ref 0.0–0.2)

## 2024-09-23 LAB — COMPREHENSIVE METABOLIC PANEL WITH GFR
ALT: 347 U/L — ABNORMAL HIGH (ref 0–44)
AST: 345 U/L — ABNORMAL HIGH (ref 15–41)
Albumin: 3.7 g/dL (ref 3.5–5.0)
Alkaline Phosphatase: 102 U/L (ref 38–126)
Anion gap: 16 — ABNORMAL HIGH (ref 5–15)
BUN: 13 mg/dL (ref 8–23)
CO2: 20 mmol/L — ABNORMAL LOW (ref 22–32)
Calcium: 8.7 mg/dL — ABNORMAL LOW (ref 8.9–10.3)
Chloride: 103 mmol/L (ref 98–111)
Creatinine, Ser: 1.01 mg/dL (ref 0.61–1.24)
GFR, Estimated: >60 mL/min (ref >60)
Glucose, Bld: 164 mg/dL — ABNORMAL HIGH (ref 70–99)
Potassium: 3.6 mmol/L (ref 3.5–5.1)
Sodium: 139 mmol/L (ref 135–145)
Total Bilirubin: 1.1 mg/dL (ref 0.0–1.2)
Total Protein: 7.2 g/dL (ref 6.5–8.1)

## 2024-09-23 LAB — GLUCOSE, CAPILLARY
Glucose-Capillary: 133 mg/dL — ABNORMAL HIGH (ref 70–99)
Glucose-Capillary: 169 mg/dL — ABNORMAL HIGH (ref 70–99)
Glucose-Capillary: 132 mg/dL — ABNORMAL HIGH (ref 70–99)
Glucose-Capillary: 162 mg/dL — ABNORMAL HIGH (ref 70–99)

## 2024-09-23 NOTE — TOC CM/SW Note (Signed)
 Transition of Care [facility]) - Inpatient Brief Assessment   Patient Details  Name: Benjamin Medina MRN: [id number] Date of Birth: [birth year]  Transition of Care [facility]) CM/SW Contact:    Alfonso Rummer, LCSW Phone Number: 09/23/2024, 8:55 AM   Clinical Narrative:  Benjamin Medina Rummer completed TOC chart review. No TOC needs identified please contact should needs arise.   Transition of Care Asessment:

## 2024-09-23 NOTE — Plan of Care (Signed)

## 2024-09-23 NOTE — Plan of Care (Signed)
  Problem: Coping: Goal: Ability to adjust to condition or change in health will improve Outcome: Progressing   Problem: Fluid Volume: Goal: Ability to maintain a balanced intake and output will improve Outcome: Progressing   Problem: Health Behavior/Discharge Planning: Goal: Ability to identify and utilize available resources and services will improve Outcome: Progressing Goal: Ability to manage health-related needs will improve Outcome: Progressing   Problem: Metabolic: Goal: Ability to maintain appropriate glucose levels will improve Outcome: Progressing   Problem: Nutritional: Goal: Maintenance of adequate nutrition will improve Outcome: Progressing Goal: Progress toward achieving an optimal weight will improve Outcome: Progressing   Problem: Skin Integrity: Goal: Risk for impaired skin integrity will decrease Outcome: Progressing   Problem: Tissue Perfusion: Goal: Adequacy of tissue perfusion will improve Outcome: Progressing   Problem: Education: Goal: Knowledge of General Education information will improve Description: Including pain rating scale, medication(s)/side effects and non-pharmacologic comfort measures Outcome: Progressing   Problem: Health Behavior/Discharge Planning: Goal: Ability to manage health-related needs will improve Outcome: Progressing   Problem: Clinical Measurements: Goal: Ability to maintain clinical measurements within normal limits will improve Outcome: Progressing Goal: Will remain free from infection Outcome: Progressing Goal: Diagnostic test results will improve Outcome: Progressing Goal: Respiratory complications will improve Outcome: Progressing Goal: Cardiovascular complication will be avoided Outcome: Progressing   Problem: Activity: Goal: Risk for activity intolerance will decrease Outcome: Progressing   Problem: Nutrition: Goal: Adequate nutrition will be maintained Outcome: Progressing   Problem: Coping: Goal:  Level of anxiety will decrease Outcome: Progressing   Problem: Elimination: Goal: Will not experience complications related to bowel motility Outcome: Progressing Goal: Will not experience complications related to urinary retention Outcome: Progressing   Problem: Safety: Goal: Ability to remain free from injury will improve Outcome: Progressing   Problem: Skin Integrity: Goal: Risk for impaired skin integrity will decrease Outcome: Progressing

## 2024-09-23 NOTE — Progress Notes (Signed)
 Progress Note    Benjamin Medina  FMW:[id number] DOB: 1951-06-26  DOA: 09/22/2024 PCP: Justus Leita DEL, MD      Brief Narrative:    Medical records reviewed and are as summarized below:  Benjamin Medina is a 73 y.o. male with medical history significant for type 2 diabetes mellitus, hypertension, BPH, class II obesity, who presented to the hospital with abdominal pain and vomiting. In the ED, BP was significantly elevated at 209/113.  Otherwise vital signs were unremarkable.  CMP was significant for elevated liver enzymes with AST of 345 and ALT of 347 but bilirubin was normal.  WBC was 15.6.  Lactic acid was 3.0. CT abdomen and pelvis was concerning for possible prostatitis and liver ultrasound showed fatty liver.       Assessment/Plan:   Principal Problem:   Abdominal pain Active Problems:   Lactic acidosis   Elevated LFTs   Hypertensive urgency   BPH (benign prostatic hyperplasia)   DM type 2, controlled, with complication (HCC)   Class II obesity    Body mass index is 37.73 kg/m.  (Class II obesity)   Abdominal pain and vomiting: Improved.  Etiology unclear.  Continue empiric IV Zosyn .  Follow-up blood cultures.   Lactic acidosis: Patient was given IV fluids for hydration.  Etiology of lactic acidosis is not clear.  Repeat lactic acid 2.4.   Possible prostatitis on CT abdomen and pelvis.  However, patient has no clinical features suggestive of prostatitis.  Urinalysis was unremarkable.   Hypertensive urgency: BP is better.  Continue lisinopril    Type II DM: Hold NovoLog  as needed for hyperglycemia   Elevated liver enzymes: Repeat liver enzymes tomorrow.  Unclear if it is related to fatty liver.  Check hepatitis panel.    Diet Order             Diet clear liquid Room service appropriate? Yes; Fluid consistency: Thin  Diet effective now                                   Consultants: None  Procedures: None    Medications:    atorvastatin   10 mg Oral Daily   enoxaparin  (LOVENOX ) injection  0.5 mg/kg Subcutaneous Q24H   insulin  aspart  0-15 Units Subcutaneous TID WC   insulin  aspart  0-5 Units Subcutaneous QHS   lisinopril   40 mg Oral Daily   Continuous Infusions:  piperacillin -tazobactam (ZOSYN )  IV 3.375 g (09/23/24 1301)     Anti-infectives (From admission, onward)    Start     Dose/Rate Route Frequency Ordered Stop   09/23/24 0430  piperacillin -tazobactam (ZOSYN ) IVPB 3.375 g        3.375 g 12.5 mL/hr over 240 Minutes Intravenous Every 8 hours 09/22/24 2302     09/22/24 2245  metroNIDAZOLE  (FLAGYL ) IVPB 500 mg  Status:  Discontinued        500 mg 100 mL/hr over 60 Minutes Intravenous Every 12 hours 09/22/24 2244 09/23/24 0017   09/22/24 2200  piperacillin -tazobactam (ZOSYN ) IVPB 3.375 g        3.375 g 100 mL/hr over 30 Minutes Intravenous  Once 09/22/24 2156 09/22/24 2335              Family Communication/Anticipated D/C date and plan/Code Status   DVT prophylaxis:      Code Status: Full Code  Family Communication: Plan discussed with his wife at the  bedside Disposition Plan: Plan to discharge home   Status is: Inpatient Remains inpatient appropriate because: Abdominal pain and vomiting       Subjective:   Interval events noted.  He feels better today.  Abdominal pain is better.  Wife at the bedside.  Objective:    Vitals:   09/23/24 0002 09/23/24 0351 09/23/24 0806 09/23/24 1537  BP: (!) 168/69 (!) 130/57 126/66 (!) 146/84  Pulse:  91 87 81  Resp:  16 16   Temp:  97.9 F (36.6 C) 98.4 F (36.9 C) 98.7 F (37.1 C)  TempSrc:  Oral  Oral  SpO2:  95% 95% 95%  Weight:      Height:       No data found.   Intake/Output Summary (Last 24 hours) at 09/23/2024 2001 Last data filed at 09/23/2024 1500 Gross per 24 hour  Intake 674.65 ml  Output --  Net 674.65 ml    Filed Weights   09/22/24 1507  Weight: (!) 140.6 kg    Exam:  GEN: NAD SKIN: Warm and dry EYES: No pallor or icterus ENT: MMM CV: RRR PULM: CTA B ABD: soft, obese, NT, +BS CNS: AAO x 3, non focal EXT: No edema or tenderness Rectal: No lesions noted on inspection.  Prostate felt normal on palpation.  No tenderness of the prostate appreciated.       Data Reviewed:   I have personally reviewed following labs and imaging studies:  Labs: Labs show the following:   Basic Metabolic Panel: Recent Labs  Lab 09/22/24 1509 09/23/24 0606  NA 138 139  K 3.6 3.6  CL 102 103  CO2 24 20*  GLUCOSE 200* 164*  BUN 15 13  CREATININE 1.08 1.01  CALCIUM  9.4 8.7*   GFR Estimated Creatinine Clearance: 101.3 mL/min (by C-G formula based on SCr of 1.01 mg/dL). Liver Function Tests: Recent Labs  Lab 09/22/24 1816 09/23/24 0606  AST 301* 345*  ALT 182* 347*  ALKPHOS 101 102  BILITOT 0.9 1.1  PROT 7.9 7.2  ALBUMIN 4.3 3.7   Recent Labs  Lab 09/22/24 1826  LIPASE 18   No results for input(s): AMMONIA in the last 168 hours. Coagulation profile No results for input(s): INR, PROTIME in the last 168 hours.  CBC: Recent Labs  Lab 09/22/24 1509 09/23/24 0606  WBC 16.6* 15.6*  HGB 14.7 14.1  HCT 45.0 41.3  MCV 93.4 91.8  PLT 243 237   Cardiac Enzymes: No results for input(s): CKTOTAL, CKMB, CKMBINDEX, TROPONINI in the last 168 hours. BNP (last 3 results) No results for input(s): PROBNP in the last 8760 hours. CBG: Recent Labs  Lab 09/22/24 2344 09/23/24 0812 09/23/24 1143 09/23/24 1537  GLUCAP 150* 133* 169* 132*   D-Dimer: No results for input(s): DDIMER in the last 72 hours. Hgb A1c: No results for input(s): HGBA1C in the last 72 hours. Lipid Profile: No results for input(s): CHOL, HDL, LDLCALC, TRIG, CHOLHDL, LDLDIRECT in the last 72 hours. Thyroid  function studies: No results for input(s): TSH, T4TOTAL, T3FREE,  THYROIDAB in the last 72 hours.  Invalid input(s): FREET3 Anemia work up: No results for input(s): VITAMINB12, FOLATE, FERRITIN, TIBC, IRON, RETICCTPCT in the last 72 hours. Sepsis Labs: Recent Labs  Lab 09/22/24 1509 09/22/24 1816 09/22/24 2101 09/23/24 0606  WBC 16.6*  --   --  15.6*  LATICACIDVEN  --  2.9* 3.0*  --     Microbiology Recent Results (from the past 240 hours)  Blood Culture (routine x  2)     Status: None (Preliminary result)   Collection Time: 09/22/24 10:36 PM   Specimen: BLOOD  Result Value Ref Range Status   Specimen Description BLOOD RIGHT ARM  Final   Special Requests   Final    BOTTLES DRAWN AEROBIC AND ANAEROBIC Blood Culture results may not be optimal due to an inadequate volume of blood received in culture bottles   Culture   Final    NO GROWTH < 12 HOURS Performed at [facility], [address] [place], [place] [address]    Report Status PENDING  Incomplete  Blood Culture (routine x 2)     Status: None (Preliminary result)   Collection Time: 09/22/24 10:36 PM   Specimen: BLOOD  Result Value Ref Range Status   Specimen Description BLOOD LEFT ARM  Final   Special Requests   Final    BOTTLES DRAWN AEROBIC AND ANAEROBIC Blood Culture results may not be optimal due to an inadequate volume of blood received in culture bottles   Culture   Final    NO GROWTH < 12 HOURS Performed at [facility], [address] [place], [place] [address]    Report Status PENDING  Incomplete    Procedures and diagnostic studies:  US  Abdomen Limited RUQ (LIVER/GB) Result Date: 09/22/2024 CLINICAL DATA:  Elevated LFTs EXAM: ULTRASOUND ABDOMEN LIMITED RIGHT UPPER QUADRANT COMPARISON:  None Available. FINDINGS: Gallbladder: Dilated gallbladder. No wall thickening or sonographic Murphy sign. No visible stones. Common bile duct: Diameter: Normal caliber, 5 mm Liver: Increased echotexture compatible with fatty infiltration. No focal  abnormality or biliary ductal dilatation. Portal vein is patent on color Doppler imaging with normal direction of blood flow towards the liver. Other: None. IMPRESSION: Fatty infiltration of the liver. No acute findings. Electronically Signed   By: Franky Crease M.D.   On: 09/22/2024 21:52   CT ABDOMEN PELVIS W CONTRAST Result Date: 09/22/2024 EXAM: CT ABDOMEN AND PELVIS WITH CONTRAST 09/22/2024 06:59:29 PM TECHNIQUE: CT of the abdomen and pelvis was performed with the administration of 100 mL of iohexol  (OMNIPAQUE ) 300 MG/ML solution. Multiplanar reformatted images are provided for review. Automated exposure control, iterative reconstruction, and/or weight-based adjustment of the mA/kV was utilized to reduce the radiation dose to as low as reasonably achievable. COMPARISON: CT abdomen dated 02/13/2007. CLINICAL HISTORY: LLQ abdominal pain. Patient to ED via POV for left lower abd pain. Started this AM with nausea. States hot/cold chills and left arm pain. States he did have dizziness upon standing. Also states some central chest pain. Hx HTN. FINDINGS: LOWER CHEST: Mild scarring/ atelectasis at the lung bases. LIVER: The liver is unremarkable. GALLBLADDER AND BILE DUCTS: Distended gallbladder, without cholelithiasis or pericholecystic inflammatory changes. No biliary ductal dilatation. SPLEEN: No acute abnormality. PANCREAS: No acute abnormality. ADRENAL GLANDS: No acute abnormality. KIDNEYS, URETERS AND BLADDER: Small simple bilateral renal cysts, measuring up to 2.2 cm in the right lower kidney (image 46), benign (Bosniak 1). Additional 2.0 cm calcified cyst in the left upper kidney (image 38), benign (Bosniak 2). No follow up is recommended. No stones in the kidneys or ureters. No hydronephrosis. No perinephric or periureteral stranding. Mildly thick walled bladder with small right posterolateral bladder diverticulum (image 92) with very mild perivesical stranding (image 89) possibly reflecting cystitis. GI  AND BOWEL: Stomach demonstrates no acute abnormality. Normal appendix (image 71). Mild left colonic diverticulosis without evidence of diverticulitis. There is no bowel obstruction. PERITONEUM AND RETROPERITONEUM: No ascites. No free air. VASCULATURE: Aorta is  normal in caliber. Atherosclerotic calcifications of the abdominal aorta and branch vessels, although patent. LYMPH NODES: No lymphadenopathy. REPRODUCTIVE ORGANS: Postprocedural changes involving the prostate with mild prostatic stranding (image 100), correlate for prostatitis. BONES AND SOFT TISSUES: Mild degenerative changes of the visualized thoracolumbar spine. Tiny fat containing bilateral inguinal hernias. No acute osseous abnormality. IMPRESSION: 1. Postprocedural changes involving the prostate. Associated mild stranding, possibly reflecting prostatitis. 2. Mildly thick-walled bladder, equivocal. Correlate for cystitis. 3. Distended gallbladder, without cholelithiasis or inflammatory changes. Electronically signed by: Pinkie Pebbles MD 09/22/2024 07:24 PM EDT RP Workstation: [id number]   DG Chest 2 View Result Date: 09/22/2024 CLINICAL DATA:  Chest pain EXAM: CHEST - 2 VIEW COMPARISON:  Chest x-ray 05/14/2024 FINDINGS: The heart size and mediastinal contours are within normal limits. Both lungs are clear. The visualized skeletal structures are unremarkable. IMPRESSION: No active cardiopulmonary disease. Electronically Signed   By: Greig Pique M.D.   On: 09/22/2024 15:33               LOS: 0 days   Lamir Racca  [facility]   Pager on [email or web address]. If 7PM-7AM, please contact night-coverage at [email or web address]     09/23/2024, 8:01 PM

## 2024-09-23 NOTE — Care Management Obs Status (Signed)
 MEDICARE OBSERVATION STATUS NOTIFICATION   Patient Details  Name: Benjamin Medina MRN: [id number] Date of Birth: 1951-11-02   Medicare Observation Status Notification Given:  Yes    [name] 09/23/2024, 1:12 PM

## 2024-09-24 DIAGNOSIS — R1084 Generalized abdominal pain: Secondary | ICD-10-CM | POA: Diagnosis not present

## 2024-09-24 LAB — COMPREHENSIVE METABOLIC PANEL WITH GFR
ALT: 367 U/L — ABNORMAL HIGH (ref 0–44)
AST: 166 U/L — ABNORMAL HIGH (ref 15–41)
Albumin: 3.8 g/dL (ref 3.5–5.0)
Alkaline Phosphatase: 130 U/L — ABNORMAL HIGH (ref 38–126)
Anion gap: 9 (ref 5–15)
BUN: 12 mg/dL (ref 8–23)
CO2: 23 mmol/L (ref 22–32)
Calcium: 8.6 mg/dL — ABNORMAL LOW (ref 8.9–10.3)
Chloride: 104 mmol/L (ref 98–111)
Creatinine, Ser: 0.93 mg/dL (ref 0.61–1.24)
GFR, Estimated: >60 mL/min (ref >60)
Glucose, Bld: 152 mg/dL — ABNORMAL HIGH (ref 70–99)
Potassium: 3.8 mmol/L (ref 3.5–5.1)
Sodium: 136 mmol/L (ref 135–145)
Total Bilirubin: 1.8 mg/dL — ABNORMAL HIGH (ref 0.0–1.2)
Total Protein: 7.2 g/dL (ref 6.5–8.1)

## 2024-09-24 LAB — GLUCOSE, CAPILLARY
Glucose-Capillary: 156 mg/dL — ABNORMAL HIGH (ref 70–99)
Glucose-Capillary: 119 mg/dL — ABNORMAL HIGH (ref 70–99)
Glucose-Capillary: 161 mg/dL — ABNORMAL HIGH (ref 70–99)
Glucose-Capillary: 148 mg/dL — ABNORMAL HIGH (ref 70–99)

## 2024-09-24 LAB — HEPATITIS PANEL, ACUTE
HCV Ab: REACTIVE — AB
Hep A IgM: NONREACTIVE
Hep B C IgM: NONREACTIVE
Hepatitis B Surface Ag: NONREACTIVE

## 2024-09-24 LAB — CBC
HCT: 41.7 % (ref 39.0–52.0)
Hemoglobin: 14.1 g/dL (ref 13.0–17.0)
MCH: 31.1 pg (ref 26.0–34.0)
MCHC: 33.8 g/dL (ref 30.0–36.0)
MCV: 92.1 fL (ref 80.0–100.0)
Platelets: 218 K/uL (ref 150–400)
RBC: 4.53 MIL/uL (ref 4.22–5.81)
RDW: 12.9 % (ref 11.5–15.5)
WBC: 15.7 K/uL — ABNORMAL HIGH (ref 4.0–10.5)
nRBC: 0 % (ref 0.0–0.2)

## 2024-09-24 MED ORDER — CALCIUM CARBONATE ANTACID 500 MG PO CHEW
400.0000 mg | CHEWABLE_TABLET | Freq: Three times a day (TID) | ORAL | Status: DC | PRN
  Administered 2024-09-24 – 2024-09-25 (×3): 400 mg via ORAL
  Filled 2024-09-24 (×4): qty 2

## 2024-09-24 MED ORDER — INSULIN ASPART 100 UNIT/ML IJ SOLN
0.0000 [IU] | Freq: Three times a day (TID) | INTRAMUSCULAR | Status: DC
  Administered 2024-09-25: 2 [IU] via SUBCUTANEOUS
  Administered 2024-09-25 – 2024-09-26 (×3): 1 [IU] via SUBCUTANEOUS
  Filled 2024-09-24 (×4): qty 1

## 2024-09-24 NOTE — Progress Notes (Signed)
 Progress Note    Benjamin Medina  FMW:[id number] DOB: Dec 13, 1951  DOA: 09/22/2024 PCP: Justus Leita DEL, MD      Brief Narrative:    Medical records reviewed and are as summarized below:  Benjamin Medina is a 73 y.o. male with medical history significant for type 2 diabetes mellitus, hypertension, BPH, class II obesity, who presented to the hospital with abdominal pain and vomiting. In the ED, BP was significantly elevated at 209/113.  Otherwise vital signs were unremarkable.  CMP was significant for elevated liver enzymes with AST of 345 and ALT of 347 but bilirubin was normal.  WBC was 15.6.  Lactic acid was 3.0. CT abdomen and pelvis was concerning for possible prostatitis and liver ultrasound showed fatty liver.       Assessment/Plan:   Principal Problem:   Abdominal pain Active Problems:   Lactic acidosis   Elevated LFTs   Hypertensive urgency   BPH (benign prostatic hyperplasia)   DM type 2, controlled, with complication (HCC)   Class II obesity    Body mass index is 37.73 kg/m.  (Class II obesity)   Abdominal pain and vomiting: Improved.  Etiology unclear.  Continue empiric IV Zosyn .  No growth on blood cultures thus far.   Lactic acidosis: Patient was given IV fluids for hydration.  Etiology of lactic acidosis is not clear.  Repeat lactic acid    Possible prostatitis on CT abdomen and pelvis.  However, patient has no clinical features suggestive of prostatitis.  Urinalysis was unremarkable. History of laser enucleation of prostate (for BPH) in June 2025.   Hypertensive urgency: BP is better.  Continue lisinopril  He said BP is usually well-controlled at home   Type II DM: Hemoglobin A1c was 6.1 on 08/11/2024. Use NovoLog  as needed for hyperglycemia.     Elevated liver enzymes: Liver enzymes are stable.  AST improved from 345-166.  However, ALT slightly worse from 347 367 and ALP up from 102-130.  Total bilirubin 1.8.  Repeat liver enzymes  tomorrow. Unclear if it is related to fatty liver.    Hepatitis C virus antibody was positive.  Of note, patient has a history of hepatitis C virus infection and completed 6 months of treatment. Repeat HCV RNA (in June 2021) after treatment with Epclusa was not detected. Outpatient follow-up with gastroenterologist recommended.     Diet Order             Diet Heart Fluid consistency: Thin  Diet effective now                                  Consultants: None  Procedures: None    Medications:    atorvastatin   10 mg Oral Daily   enoxaparin  (LOVENOX ) injection  0.5 mg/kg Subcutaneous Q24H   insulin  aspart  0-9 Units Subcutaneous TID WC   lisinopril   40 mg Oral Daily   Continuous Infusions:  piperacillin -tazobactam (ZOSYN )  IV 3.375 g (09/24/24 0340)     Anti-infectives (From admission, onward)    Start     Dose/Rate Route Frequency Ordered Stop   09/23/24 0430  piperacillin -tazobactam (ZOSYN ) IVPB 3.375 g        3.375 g 12.5 mL/hr over 240 Minutes Intravenous Every 8 hours 09/22/24 2302     09/22/24 2245  metroNIDAZOLE  (FLAGYL ) IVPB 500 mg  Status:  Discontinued        500 mg 100 mL/hr  over 60 Minutes Intravenous Every 12 hours 09/22/24 2244 09/23/24 0017   09/22/24 2200  piperacillin -tazobactam (ZOSYN ) IVPB 3.375 g        3.375 g 100 mL/hr over 30 Minutes Intravenous  Once 09/22/24 2156 09/22/24 2335              Family Communication/Anticipated D/C date and plan/Code Status   DVT prophylaxis:      Code Status: Full Code  Family Communication: Plan discussed with his wife at the bedside Disposition Plan: Plan to discharge home   Status is: Inpatient Remains inpatient appropriate because: Abdominal pain and vomiting       Subjective:   Interval events noted.  No complaints.  No vomiting or abdominal pain.  He is able to walk to the bathroom.  His wife was at the bedside.  Julietta, RN, was at the bedside.  Objective:     Vitals:   09/23/24 1537 09/23/24 2007 09/23/24 2146 09/24/24 0316  BP: (!) 146/84 (!) 197/94 (!) 150/81 (!) 157/88  Pulse: 81 91 (!) 108 99  Resp:  16  20  Temp: 98.7 F (37.1 C) 98.9 F (37.2 C)  99.2 F (37.3 C)  TempSrc: Oral Oral  Oral  SpO2: 95% 97%  91%  Weight:      Height:       No data found.   Intake/Output Summary (Last 24 hours) at 09/24/2024 1213 Last data filed at 09/24/2024 0900 Gross per 24 hour  Intake 914.65 ml  Output --  Net 914.65 ml   Filed Weights   09/22/24 1507  Weight: (!) 140.6 kg    Exam:  GEN: NAD SKIN: Warm and dry EYES: No pallor or icterus ENT: MMM CV: RRR PULM: CTA B ABD: soft, obese, NT, +BS CNS: AAO x 3, non focal EXT: No edema or tenderness       Data Reviewed:   I have personally reviewed following labs and imaging studies:  Labs: Labs show the following:   Basic Metabolic Panel: Recent Labs  Lab 09/22/24 1509 09/23/24 0606 09/24/24 0626  NA 138 139 136  K 3.6 3.6 3.8  CL 102 103 104  CO2 24 20* 23  GLUCOSE 200* 164* 152*  BUN 15 13 12   CREATININE 1.08 1.01 0.93  CALCIUM  9.4 8.7* 8.6*   GFR Estimated Creatinine Clearance: 110 mL/min (by C-G formula based on SCr of 0.93 mg/dL). Liver Function Tests: Recent Labs  Lab 09/22/24 1816 09/23/24 0606 09/24/24 0626  AST 301* 345* 166*  ALT 182* 347* 367*  ALKPHOS 101 102 130*  BILITOT 0.9 1.1 1.8*  PROT 7.9 7.2 7.2  ALBUMIN 4.3 3.7 3.8   Recent Labs  Lab 09/22/24 1826  LIPASE 18   No results for input(s): AMMONIA in the last 168 hours. Coagulation profile No results for input(s): INR, PROTIME in the last 168 hours.  CBC: Recent Labs  Lab 09/22/24 1509 09/23/24 0606 09/24/24 0626  WBC 16.6* 15.6* 15.7*  HGB 14.7 14.1 14.1  HCT 45.0 41.3 41.7  MCV 93.4 91.8 92.1  PLT 243 237 218   Cardiac Enzymes: No results for input(s): CKTOTAL, CKMB, CKMBINDEX, TROPONINI in the last 168 hours. BNP (last 3 results) No results for  input(s): PROBNP in the last 8760 hours. CBG: Recent Labs  Lab 09/23/24 1143 09/23/24 1537 09/23/24 2144 09/24/24 0812 09/24/24 1136  GLUCAP 169* 132* 162* 156* 119*   D-Dimer: No results for input(s): DDIMER in the last 72 hours. Hgb A1c: No results  for input(s): HGBA1C in the last 72 hours. Lipid Profile: No results for input(s): CHOL, HDL, LDLCALC, TRIG, CHOLHDL, LDLDIRECT in the last 72 hours. Thyroid  function studies: No results for input(s): TSH, T4TOTAL, T3FREE, THYROIDAB in the last 72 hours.  Invalid input(s): FREET3 Anemia work up: No results for input(s): VITAMINB12, FOLATE, FERRITIN, TIBC, IRON, RETICCTPCT in the last 72 hours. Sepsis Labs: Recent Labs  Lab 09/22/24 1509 09/22/24 1816 09/22/24 2101 09/23/24 0606 09/24/24 0626  WBC 16.6*  --   --  15.6* 15.7*  LATICACIDVEN  --  2.9* 3.0*  --   --     Microbiology Recent Results (from the past 240 hours)  Blood Culture (routine x 2)     Status: None (Preliminary result)   Collection Time: 09/22/24 10:36 PM   Specimen: BLOOD  Result Value Ref Range Status   Specimen Description BLOOD RIGHT ARM  Final   Special Requests   Final    BOTTLES DRAWN AEROBIC AND ANAEROBIC Blood Culture results may not be optimal due to an inadequate volume of blood received in culture bottles   Culture   Final    NO GROWTH 2 DAYS Performed at [facility], [address] [place], [place] [address]    Report Status PENDING  Incomplete  Blood Culture (routine x 2)     Status: None (Preliminary result)   Collection Time: 09/22/24 10:36 PM   Specimen: BLOOD  Result Value Ref Range Status   Specimen Description BLOOD LEFT ARM  Final   Special Requests   Final    BOTTLES DRAWN AEROBIC AND ANAEROBIC Blood Culture results may not be optimal due to an inadequate volume of blood received in culture bottles   Culture   Final    NO GROWTH 2 DAYS Performed at [facility], [address] [place], [place] [address]    Report Status PENDING  Incomplete    Procedures and diagnostic studies:  US  Abdomen Limited RUQ (LIVER/GB) Result Date: 09/22/2024 CLINICAL DATA:  Elevated LFTs EXAM: ULTRASOUND ABDOMEN LIMITED RIGHT UPPER QUADRANT COMPARISON:  None Available. FINDINGS: Gallbladder: Dilated gallbladder. No wall thickening or sonographic Murphy sign. No visible stones. Common bile duct: Diameter: Normal caliber, 5 mm Liver: Increased echotexture compatible with fatty infiltration. No focal abnormality or biliary ductal dilatation. Portal vein is patent on color Doppler imaging with normal direction of blood flow towards the liver. Other: None. IMPRESSION: Fatty infiltration of the liver. No acute findings. Electronically Signed   By: Franky Crease M.D.   On: 09/22/2024 21:52   CT ABDOMEN PELVIS W CONTRAST Result Date: 09/22/2024 EXAM: CT ABDOMEN AND PELVIS WITH CONTRAST 09/22/2024 06:59:29 PM TECHNIQUE: CT of the abdomen and pelvis was performed with the administration of 100 mL of iohexol  (OMNIPAQUE ) 300 MG/ML solution. Multiplanar reformatted images are provided for review. Automated exposure control, iterative reconstruction, and/or weight-based adjustment of the mA/kV was utilized to reduce the radiation dose to as low as reasonably achievable. COMPARISON: CT abdomen dated 02/13/2007. CLINICAL HISTORY: LLQ abdominal pain. Patient to ED via POV for left lower abd pain. Started this AM with nausea. States hot/cold chills and left arm pain. States he did have dizziness upon standing. Also states some central chest pain. Hx HTN. FINDINGS: LOWER CHEST: Mild scarring/ atelectasis at the lung bases. LIVER: The liver is unremarkable. GALLBLADDER AND BILE DUCTS: Distended gallbladder, without cholelithiasis or pericholecystic inflammatory changes. No biliary ductal dilatation. SPLEEN: No acute abnormality. PANCREAS: No acute abnormality. ADRENAL GLANDS: No acute abnormality.  KIDNEYS,  URETERS AND BLADDER: Small simple bilateral renal cysts, measuring up to 2.2 cm in the right lower kidney (image 46), benign (Bosniak 1). Additional 2.0 cm calcified cyst in the left upper kidney (image 38), benign (Bosniak 2). No follow up is recommended. No stones in the kidneys or ureters. No hydronephrosis. No perinephric or periureteral stranding. Mildly thick walled bladder with small right posterolateral bladder diverticulum (image 92) with very mild perivesical stranding (image 89) possibly reflecting cystitis. GI AND BOWEL: Stomach demonstrates no acute abnormality. Normal appendix (image 71). Mild left colonic diverticulosis without evidence of diverticulitis. There is no bowel obstruction. PERITONEUM AND RETROPERITONEUM: No ascites. No free air. VASCULATURE: Aorta is normal in caliber. Atherosclerotic calcifications of the abdominal aorta and branch vessels, although patent. LYMPH NODES: No lymphadenopathy. REPRODUCTIVE ORGANS: Postprocedural changes involving the prostate with mild prostatic stranding (image 100), correlate for prostatitis. BONES AND SOFT TISSUES: Mild degenerative changes of the visualized thoracolumbar spine. Tiny fat containing bilateral inguinal hernias. No acute osseous abnormality. IMPRESSION: 1. Postprocedural changes involving the prostate. Associated mild stranding, possibly reflecting prostatitis. 2. Mildly thick-walled bladder, equivocal. Correlate for cystitis. 3. Distended gallbladder, without cholelithiasis or inflammatory changes. Electronically signed by: Pinkie Pebbles MD 09/22/2024 07:24 PM EDT RP Workstation: [id number]   DG Chest 2 View Result Date: 09/22/2024 CLINICAL DATA:  Chest pain EXAM: CHEST - 2 VIEW COMPARISON:  Chest x-ray 05/14/2024 FINDINGS: The heart size and mediastinal contours are within normal limits. Both lungs are clear. The visualized skeletal structures are unremarkable. IMPRESSION: No active cardiopulmonary disease. Electronically Signed    By: Greig Pique M.D.   On: 09/22/2024 15:33               LOS: 1 day   Tyrese Ficek  [facility]   Pager on [email or web address]. If 7PM-7AM, please contact night-coverage at [email or web address]     09/24/2024, 12:13 PM

## 2024-09-24 NOTE — Care Management Important Message (Signed)
 Important Message  Patient Details  Name: Benjamin Medina MRN: [id number] Date of Birth: 10-20-1951   Important Message Given:  Yes - Medicare IM     [name] 09/24/2024, 3:38 PM

## 2024-09-24 NOTE — TOC CM/SW Note (Signed)
 Transition of Care [facility]) - Inpatient Brief Assessment   Patient Details  Name: Benjamin Medina MRN: [id number] Date of Birth: [birth year]  Transition of Care [facility]) CM/SW Contact:    Alfonso Rummer, LCSW Phone Number: 09/24/2024, 10:41 AM   Clinical Narrative: Benjamin Medina Rummer completed TOC chart review. No TOC needs identified please contact should need arise.    Transition of Care Asessment:

## 2024-09-24 NOTE — Plan of Care (Signed)

## 2024-09-25 DIAGNOSIS — R1084 Generalized abdominal pain: Secondary | ICD-10-CM | POA: Diagnosis not present

## 2024-09-25 LAB — GLUCOSE, CAPILLARY
Glucose-Capillary: 140 mg/dL — ABNORMAL HIGH (ref 70–99)
Glucose-Capillary: 124 mg/dL — ABNORMAL HIGH (ref 70–99)
Glucose-Capillary: 189 mg/dL — ABNORMAL HIGH (ref 70–99)
Glucose-Capillary: 167 mg/dL — ABNORMAL HIGH (ref 70–99)

## 2024-09-25 LAB — CBC
HCT: 40.1 % (ref 39.0–52.0)
Hemoglobin: 13.8 g/dL (ref 13.0–17.0)
MCH: 31.2 pg (ref 26.0–34.0)
MCHC: 34.4 g/dL (ref 30.0–36.0)
MCV: 90.7 fL (ref 80.0–100.0)
Platelets: 244 K/uL (ref 150–400)
RBC: 4.42 MIL/uL (ref 4.22–5.81)
RDW: 12.9 % (ref 11.5–15.5)
WBC: 12.1 K/uL — ABNORMAL HIGH (ref 4.0–10.5)
nRBC: 0 % (ref 0.0–0.2)

## 2024-09-25 LAB — COMPREHENSIVE METABOLIC PANEL WITH GFR
ALT: 236 U/L — ABNORMAL HIGH (ref 0–44)
AST: 74 U/L — ABNORMAL HIGH (ref 15–41)
Albumin: 3.5 g/dL (ref 3.5–5.0)
Alkaline Phosphatase: 169 U/L — ABNORMAL HIGH (ref 38–126)
Anion gap: 8 (ref 5–15)
BUN: 15 mg/dL (ref 8–23)
CO2: 23 mmol/L (ref 22–32)
Calcium: 8.7 mg/dL — ABNORMAL LOW (ref 8.9–10.3)
Chloride: 104 mmol/L (ref 98–111)
Creatinine, Ser: 0.9 mg/dL (ref 0.61–1.24)
GFR, Estimated: >60 mL/min (ref >60)
Glucose, Bld: 132 mg/dL — ABNORMAL HIGH (ref 70–99)
Potassium: 3.6 mmol/L (ref 3.5–5.1)
Sodium: 135 mmol/L (ref 135–145)
Total Bilirubin: 1.5 mg/dL — ABNORMAL HIGH (ref 0.0–1.2)
Total Protein: 7.6 g/dL (ref 6.5–8.1)

## 2024-09-25 MED ORDER — ALUM & MAG HYDROXIDE-SIMETH 200-200-20 MG/5ML PO SUSP
30.0000 mL | Freq: Four times a day (QID) | ORAL | Status: DC | PRN
  Administered 2024-09-25: 30 mL via ORAL
  Filled 2024-09-25: qty 30

## 2024-09-25 MED ORDER — PANTOPRAZOLE SODIUM 40 MG PO TBEC
40.0000 mg | DELAYED_RELEASE_TABLET | Freq: Every day | ORAL | Status: DC
  Administered 2024-09-25 – 2024-09-26 (×2): 40 mg via ORAL
  Filled 2024-09-25 (×2): qty 1

## 2024-09-25 NOTE — Progress Notes (Signed)
 Progress Note    Benjamin Medina  FMW:[id number] DOB: April 28, 1951  DOA: 09/22/2024 PCP: Justus Leita DEL, MD      Brief Narrative:    Medical records reviewed and are as summarized below:  Benjamin Medina is a 73 y.o. male with medical history significant for type 2 diabetes mellitus, hypertension, BPH, class II obesity, who presented to the hospital with abdominal pain and vomiting. In the ED, BP was significantly elevated at 209/113.  Otherwise vital signs were unremarkable.  CMP was significant for elevated liver enzymes with AST of 345 and ALT of 347 but bilirubin was normal.  WBC was 15.6.  Lactic acid was 3.0. CT abdomen and pelvis was concerning for possible prostatitis and liver ultrasound showed fatty liver.       Assessment/Plan:   Principal Problem:   Abdominal pain Active Problems:   Lactic acidosis   Elevated LFTs   Hypertensive urgency   BPH (benign prostatic hyperplasia)   DM type 2, controlled, with complication (HCC)   Class II obesity    Body mass index is 37.73 kg/m.  (Class II obesity)   Abdominal pain and vomiting: Abdominal pain had improved.  Later this afternoon, complained of nausea and heartburn.   Protonix and Maalox as needed has been ordered.  Discussed possibility of repeating CT abdomen pelvis if he does not improve.  He is not interested in repeat CT at this time. Leukocytosis is improving.  Continue IV Zosyn . No growth on blood cultures.    Lactic acidosis: Patient was given IV fluids for hydration.  Etiology of lactic acidosis is not clear.  Repeat lactic acid    Possible prostatitis on CT abdomen and pelvis.  However, patient has no clinical features suggestive of prostatitis.  Urinalysis was unremarkable. History of laser enucleation of prostate (for BPH) in June 2025.   Hypertensive urgency: BP was elevated earlier this morning.  Of note, patient said that his BP is usually normal at home.  He used to take HCTZ but he  stopped taking this because of frequent urination.  He stopped just before his prostate surgery. Continue lisinopril . Advised to follow-up with PCP for ongoing BP management and consider adding a second agent if BP remains uncontrolled.    Type II DM: Hemoglobin A1c was 6.1 on 08/11/2024. Use NovoLog  as needed for hyperglycemia.     Elevated liver enzymes: Liver enzymes are improving. AST improved from [phone].  ALT down from 367-236, bilirubin down from 1.8-1.5.  Hepatitis C virus antibody was positive.  Of note, patient has a history of hepatitis C virus infection and completed 6 months of treatment. Repeat HCV RNA (in June 2021) after treatment with Epclusa was not detected. Outpatient follow-up with gastroenterologist recommended.     Diet Order             Diet Heart Fluid consistency: Thin  Diet effective now                                  Consultants: None  Procedures: None    Medications:    enoxaparin  (LOVENOX ) injection  0.5 mg/kg Subcutaneous Q24H   insulin  aspart  0-9 Units Subcutaneous TID WC   lisinopril   40 mg Oral Daily   pantoprazole  40 mg Oral Daily   Continuous Infusions:  piperacillin -tazobactam (ZOSYN )  IV 3.375 g (09/25/24 1303)     Anti-infectives (From admission, onward)  Start     Dose/Rate Route Frequency Ordered Stop   09/23/24 0430  piperacillin -tazobactam (ZOSYN ) IVPB 3.375 g        3.375 g 12.5 mL/hr over 240 Minutes Intravenous Every 8 hours 09/22/24 2302     09/22/24 2245  metroNIDAZOLE  (FLAGYL ) IVPB 500 mg  Status:  Discontinued        500 mg 100 mL/hr over 60 Minutes Intravenous Every 12 hours 09/22/24 2244 09/23/24 0017   09/22/24 2200  piperacillin -tazobactam (ZOSYN ) IVPB 3.375 g        3.375 g 100 mL/hr over 30 Minutes Intravenous  Once 09/22/24 2156 09/22/24 2335              Family Communication/Anticipated D/C date and plan/Code Status   DVT prophylaxis:      Code Status: Full  Code  Family Communication: Plan discussed with his wife at the bedside Disposition Plan: Plan to discharge home   Status is: Inpatient Remains inpatient appropriate because: Abdominal pain and vomiting       Subjective:   Interval events noted.  He had no complaints.  No abdominal pain or vomiting.  He said he felt better and was ready to go home.  However, later in the afternoon, he complained of nausea and heartburn.  Objective:    Vitals:   09/25/24 0341 09/25/24 0848 09/25/24 1127 09/25/24 1304  BP: (!) 150/60 (!) 179/102 (!) 171/72 (!) 167/83  Pulse: 91 94 82 86  Resp: 18 18  18   Temp: 98.7 F (37.1 C) 98.2 F (36.8 C)  99 F (37.2 C)  TempSrc: Oral Axillary  Oral  SpO2: 95% 96%    Weight:      Height:       No data found.   Intake/Output Summary (Last 24 hours) at 09/25/2024 1443 Last data filed at 09/24/2024 1918 Gross per 24 hour  Intake 0 ml  Output --  Net 0 ml   Filed Weights   09/22/24 1507  Weight: (!) 140.6 kg    Exam:  GEN: NAD SKIN: Warm and dry EYES: No pallor or icterus ENT: MMM CV: RRR PULM: CTA B ABD: soft, obese, NT, +BS CNS: AAO x 3, non focal EXT: No edema or tenderness      Data Reviewed:   I have personally reviewed following labs and imaging studies:  Labs: Labs show the following:   Basic Metabolic Panel: Recent Labs  Lab 09/22/24 1509 09/23/24 0606 09/24/24 0626 09/25/24 0547  NA 138 139 136 135  K 3.6 3.6 3.8 3.6  CL 102 103 104 104  CO2 24 20* 23 23  GLUCOSE 200* 164* 152* 132*  BUN 15 13 12 15   CREATININE 1.08 1.01 0.93 0.90  CALCIUM  9.4 8.7* 8.6* 8.7*   GFR Estimated Creatinine Clearance: 113.6 mL/min (by C-G formula based on SCr of 0.9 mg/dL). Liver Function Tests: Recent Labs  Lab 09/22/24 1816 09/23/24 0606 09/24/24 0626 09/25/24 0547  AST 301* 345* 166* 74*  ALT 182* 347* 367* 236*  ALKPHOS 101 102 130* 169*  BILITOT 0.9 1.1 1.8* 1.5*  PROT 7.9 7.2 7.2 7.6  ALBUMIN 4.3 3.7 3.8 3.5    Recent Labs  Lab 09/22/24 1826  LIPASE 18   No results for input(s): AMMONIA in the last 168 hours. Coagulation profile No results for input(s): INR, PROTIME in the last 168 hours.  CBC: Recent Labs  Lab 09/22/24 1509 09/23/24 0606 09/24/24 0626 09/25/24 0547  WBC 16.6* 15.6* 15.7* 12.1*  HGB 14.7 14.1 14.1 13.8  HCT 45.0 41.3 41.7 40.1  MCV 93.4 91.8 92.1 90.7  PLT 243 237 218 244   Cardiac Enzymes: No results for input(s): CKTOTAL, CKMB, CKMBINDEX, TROPONINI in the last 168 hours. BNP (last 3 results) No results for input(s): PROBNP in the last 8760 hours. CBG: Recent Labs  Lab 09/24/24 1136 09/24/24 1601 09/24/24 2048 09/25/24 0852 09/25/24 1159  GLUCAP 119* 161* 148* 140* 124*   D-Dimer: No results for input(s): DDIMER in the last 72 hours. Hgb A1c: No results for input(s): HGBA1C in the last 72 hours. Lipid Profile: No results for input(s): CHOL, HDL, LDLCALC, TRIG, CHOLHDL, LDLDIRECT in the last 72 hours. Thyroid  function studies: No results for input(s): TSH, T4TOTAL, T3FREE, THYROIDAB in the last 72 hours.  Invalid input(s): FREET3 Anemia work up: No results for input(s): VITAMINB12, FOLATE, FERRITIN, TIBC, IRON, RETICCTPCT in the last 72 hours. Sepsis Labs: Recent Labs  Lab 09/22/24 1509 09/22/24 1816 09/22/24 2101 09/23/24 0606 09/24/24 0626 09/25/24 0547  WBC 16.6*  --   --  15.6* 15.7* 12.1*  LATICACIDVEN  --  2.9* 3.0*  --   --   --     Microbiology Recent Results (from the past 240 hours)  Blood Culture (routine x 2)     Status: None (Preliminary result)   Collection Time: 09/22/24 10:36 PM   Specimen: BLOOD  Result Value Ref Range Status   Specimen Description BLOOD RIGHT ARM  Final   Special Requests   Final    BOTTLES DRAWN AEROBIC AND ANAEROBIC Blood Culture results may not be optimal due to an inadequate volume of blood received in culture bottles   Culture   Final    NO  GROWTH 3 DAYS Performed at [facility], [address] [place], [place] [address]    Report Status PENDING  Incomplete  Blood Culture (routine x 2)     Status: None (Preliminary result)   Collection Time: 09/22/24 10:36 PM   Specimen: BLOOD  Result Value Ref Range Status   Specimen Description BLOOD LEFT ARM  Final   Special Requests   Final    BOTTLES DRAWN AEROBIC AND ANAEROBIC Blood Culture results may not be optimal due to an inadequate volume of blood received in culture bottles   Culture   Final    NO GROWTH 3 DAYS Performed at [facility], [address], [place], [place] [address]    Report Status PENDING  Incomplete    Procedures and diagnostic studies:  No results found.              LOS: 2 days   Rachella Basden  [facility]   Pager on [email or web address]. If 7PM-7AM, please contact night-coverage at [email or web address]     09/25/2024, 2:43 PM

## 2024-09-25 NOTE — Plan of Care (Signed)
  Problem: Clinical Measurements: Goal: Ability to maintain clinical measurements within normal limits will improve Outcome: Progressing   Problem: Activity: Goal: Risk for activity intolerance will decrease Outcome: Progressing   Problem: Pain Managment: Goal: General experience of comfort will improve and/or be controlled Outcome: Progressing   Problem: Safety: Goal: Ability to remain free from injury will improve Outcome: Progressing

## 2024-09-25 NOTE — Plan of Care (Signed)
  Problem: Coping: Goal: Ability to adjust to condition or change in health will improve Outcome: Progressing   Problem: Nutritional: Goal: Maintenance of adequate nutrition will improve Outcome: Progressing   Problem: Tissue Perfusion: Goal: Adequacy of tissue perfusion will improve Outcome: Progressing   Problem: Health Behavior/Discharge Planning: Goal: Ability to manage health-related needs will improve Outcome: Progressing   Problem: Clinical Measurements: Goal: Ability to maintain clinical measurements within normal limits will improve Outcome: Progressing Goal: Will remain free from infection Outcome: Progressing

## 2024-09-26 DIAGNOSIS — R1084 Generalized abdominal pain: Secondary | ICD-10-CM | POA: Diagnosis not present

## 2024-09-26 LAB — COMPREHENSIVE METABOLIC PANEL WITH GFR
ALT: 226 U/L — ABNORMAL HIGH (ref 0–44)
AST: 119 U/L — ABNORMAL HIGH (ref 15–41)
Albumin: 3.5 g/dL (ref 3.5–5.0)
Alkaline Phosphatase: 255 U/L — ABNORMAL HIGH (ref 38–126)
Anion gap: 11 (ref 5–15)
BUN: 16 mg/dL (ref 8–23)
CO2: 21 mmol/L — ABNORMAL LOW (ref 22–32)
Calcium: 8.8 mg/dL — ABNORMAL LOW (ref 8.9–10.3)
Chloride: 102 mmol/L (ref 98–111)
Creatinine, Ser: 0.83 mg/dL (ref 0.61–1.24)
GFR, Estimated: >60 mL/min (ref >60)
Glucose, Bld: 153 mg/dL — ABNORMAL HIGH (ref 70–99)
Potassium: 3.3 mmol/L — ABNORMAL LOW (ref 3.5–5.1)
Sodium: 134 mmol/L — ABNORMAL LOW (ref 135–145)
Total Bilirubin: 3.9 mg/dL — ABNORMAL HIGH (ref 0.0–1.2)
Total Protein: 7.8 g/dL (ref 6.5–8.1)

## 2024-09-26 LAB — CBC
HCT: 43.1 % (ref 39.0–52.0)
Hemoglobin: 14.7 g/dL (ref 13.0–17.0)
MCH: 31 pg (ref 26.0–34.0)
MCHC: 34.1 g/dL (ref 30.0–36.0)
MCV: 90.9 fL (ref 80.0–100.0)
Platelets: 283 K/uL (ref 150–400)
RBC: 4.74 MIL/uL (ref 4.22–5.81)
RDW: 12.5 % (ref 11.5–15.5)
WBC: 13.2 K/uL — ABNORMAL HIGH (ref 4.0–10.5)
nRBC: 0 % (ref 0.0–0.2)

## 2024-09-26 LAB — GLUCOSE, CAPILLARY: Glucose-Capillary: 142 mg/dL — ABNORMAL HIGH (ref 70–99)

## 2024-09-26 LAB — LACTIC ACID, PLASMA: Lactic Acid, Venous: 1 mmol/L (ref 0.5–1.9)

## 2024-09-26 NOTE — Discharge Summary (Signed)
 Physician Discharge Summary   Patient: Benjamin Medina MRN: [id number] DOB: 11/27/1951  Admit date:     09/22/2024  Discharge date: 09/26/24  Discharge Physician: AIDA CHO   PCP: Justus Leita DEL, MD   Recommendations at discharge:   Follow-up with PCP in 1 week  Discharge Diagnoses: Principal Problem:   Abdominal pain Active Problems:   Lactic acidosis   Elevated LFTs   Hypertensive urgency   BPH (benign prostatic hyperplasia)   DM type 2, controlled, with complication (HCC)   Class II obesity  Resolved Problems:   * No resolved hospital problems. *  Hospital Course:  Benjamin Medina is a 73 y.o. male with medical history significant for type 2 diabetes mellitus, hypertension, BPH, class II obesity, who presented to the hospital with abdominal pain and vomiting. In the ED, BP was significantly elevated at 209/113.  Otherwise vital signs were unremarkable.   CMP was significant for elevated liver enzymes with AST of 345 and ALT of 347 but bilirubin was normal.  WBC was 15.6.  Lactic acid was 3.0. CT abdomen and pelvis was concerning for possible prostatitis and liver ultrasound showed fatty liver.    Assessment and Plan:   Abdominal pain and vomiting: Abdominal pain and vomiting have resolved. Heartburn, GERD: Improved.  Patient started he takes Tums as needed at home for GERD. Leukocytosis is improving.  No growth on blood cultures. S/p treatment with IV Zosyn .  No additional antibiotics at discharge.    Lactic acidosis: Resolved.  Patient was given IV fluids for hydration.  Etiology of lactic acidosis is not clear.       Possible prostatitis on CT abdomen and pelvis.  However, patient has no clinical features suggestive of prostatitis.  Urinalysis was unremarkable. History of laser enucleation of prostate (for BPH) in June 2025.     Hypertensive urgency: BP has improved.  Continue lisinopril  at discharge.  Of note, patient said that his BP is usually  normal at home.  He used to take HCTZ but he stopped taking this because of frequent urination.  He stopped just before his prostate surgery. He also used to take amlodipine  in the past but he is no longer on amlodipine . Advised to follow-up with PCP for ongoing BP management and consider adding a second agent if BP remains uncontrolled.     Type II DM: Hemoglobin A1c was 6.1 on 08/11/2024. Use dietary measures for glucose control     Elevated liver enzymes: Liver enzymes are improving. AST improved from [phone].  ALT down from [phone], bilirubin down from 1.8-1.5 that went up to 3.9. Of note, patient said he used to drink alcohol heavily but has cut down on his drinking in the past year or so.  He said he only drinks occasionally.  He has been advised to avoid alcohol given liver enzyme derangement.  Follow-up with PCP recommended for monitoring of liver enzymes.    Hepatitis C virus antibody was positive.  Of note, patient has a history of hepatitis C virus infection and completed 6 months of treatment. Repeat HCV RNA (in June 2021) after treatment with Epclusa was not detected. Outpatient follow-up with gastroenterologist recommended.     His condition has improved.  He feels well enough to go home today and wants to be discharged as soon as possible.  Discussed discharge plans with the patient with his wife via speaker phone.  [place], RN, was at the bedside during this encounter.  Consultants: None Procedures performed: None Disposition: Home Diet recommendation:  Discharge Diet Orders (From admission, onward)     Start     Ordered   09/26/24 0000  Diet - low sodium heart healthy        09/26/24 0918           Cardiac and Carb modified diet DISCHARGE MEDICATION: Allergies as of 09/26/2024   No Known Allergies      Medication List     STOP taking these medications    atorvastatin  10 MG tablet Commonly known as: LIPITOR   fluticasone 50  MCG/ACT nasal spray Commonly known as: FLONASE   hydrochlorothiazide  12.5 MG capsule Commonly known as: MICROZIDE    terazosin  1 MG capsule Commonly known as: HYTRIN        TAKE these medications    lisinopril  40 MG tablet Commonly known as: ZESTRIL  Take 1 tablet (40 mg total) by mouth daily.        Discharge Exam: Filed Weights   09/22/24 1507  Weight: (!) 140.6 kg   GEN: NAD SKIN: Warm and dry EYES: No pallor or icterus ENT: MMM CV: RRR PULM: CTA B ABD: soft, obese, NT, +BS CNS: AAO x 3, non focal EXT: No edema or tenderness   Condition at discharge: good  The results of significant diagnostics from this hospitalization (including imaging, microbiology, ancillary and laboratory) are listed below for reference.   Imaging Studies: US  Abdomen Limited RUQ (LIVER/GB) Result Date: 09/22/2024 CLINICAL DATA:  Elevated LFTs EXAM: ULTRASOUND ABDOMEN LIMITED RIGHT UPPER QUADRANT COMPARISON:  None Available. FINDINGS: Gallbladder: Dilated gallbladder. No wall thickening or sonographic Murphy sign. No visible stones. Common bile duct: Diameter: Normal caliber, 5 mm Liver: Increased echotexture compatible with fatty infiltration. No focal abnormality or biliary ductal dilatation. Portal vein is patent on color Doppler imaging with normal direction of blood flow towards the liver. Other: None. IMPRESSION: Fatty infiltration of the liver. No acute findings. Electronically Signed   By: Franky Crease M.D.   On: 09/22/2024 21:52   CT ABDOMEN PELVIS W CONTRAST Result Date: 09/22/2024 EXAM: CT ABDOMEN AND PELVIS WITH CONTRAST 09/22/2024 06:59:29 PM TECHNIQUE: CT of the abdomen and pelvis was performed with the administration of 100 mL of iohexol  (OMNIPAQUE ) 300 MG/ML solution. Multiplanar reformatted images are provided for review. Automated exposure control, iterative reconstruction, and/or weight-based adjustment of the mA/kV was utilized to reduce the radiation dose to as low as  reasonably achievable. COMPARISON: CT abdomen dated 02/13/2007. CLINICAL HISTORY: LLQ abdominal pain. Patient to ED via POV for left lower abd pain. Started this AM with nausea. States hot/cold chills and left arm pain. States he did have dizziness upon standing. Also states some central chest pain. Hx HTN. FINDINGS: LOWER CHEST: Mild scarring/ atelectasis at the lung bases. LIVER: The liver is unremarkable. GALLBLADDER AND BILE DUCTS: Distended gallbladder, without cholelithiasis or pericholecystic inflammatory changes. No biliary ductal dilatation. SPLEEN: No acute abnormality. PANCREAS: No acute abnormality. ADRENAL GLANDS: No acute abnormality. KIDNEYS, URETERS AND BLADDER: Small simple bilateral renal cysts, measuring up to 2.2 cm in the right lower kidney (image 46), benign (Bosniak 1). Additional 2.0 cm calcified cyst in the left upper kidney (image 38), benign (Bosniak 2). No follow up is recommended. No stones in the kidneys or ureters. No hydronephrosis. No perinephric or periureteral stranding. Mildly thick walled bladder with small right posterolateral bladder diverticulum (image 92) with very mild perivesical stranding (image 89) possibly reflecting cystitis. GI AND BOWEL: Stomach demonstrates no acute abnormality. Normal  appendix (image 71). Mild left colonic diverticulosis without evidence of diverticulitis. There is no bowel obstruction. PERITONEUM AND RETROPERITONEUM: No ascites. No free air. VASCULATURE: Aorta is normal in caliber. Atherosclerotic calcifications of the abdominal aorta and branch vessels, although patent. LYMPH NODES: No lymphadenopathy. REPRODUCTIVE ORGANS: Postprocedural changes involving the prostate with mild prostatic stranding (image 100), correlate for prostatitis. BONES AND SOFT TISSUES: Mild degenerative changes of the visualized thoracolumbar spine. Tiny fat containing bilateral inguinal hernias. No acute osseous abnormality. IMPRESSION: 1. Postprocedural changes involving  the prostate. Associated mild stranding, possibly reflecting prostatitis. 2. Mildly thick-walled bladder, equivocal. Correlate for cystitis. 3. Distended gallbladder, without cholelithiasis or inflammatory changes. Electronically signed by: Pinkie Pebbles MD 09/22/2024 07:24 PM EDT RP Workstation: [id number]   DG Chest 2 View Result Date: 09/22/2024 CLINICAL DATA:  Chest pain EXAM: CHEST - 2 VIEW COMPARISON:  Chest x-ray 05/14/2024 FINDINGS: The heart size and mediastinal contours are within normal limits. Both lungs are clear. The visualized skeletal structures are unremarkable. IMPRESSION: No active cardiopulmonary disease. Electronically Signed   By: Greig Pique M.D.   On: 09/22/2024 15:33    Microbiology: Results for orders placed or performed during the hospital encounter of 09/22/24  Blood Culture (routine x 2)     Status: None (Preliminary result)   Collection Time: 09/22/24 10:36 PM   Specimen: BLOOD  Result Value Ref Range Status   Specimen Description BLOOD RIGHT ARM  Final   Special Requests   Final    BOTTLES DRAWN AEROBIC AND ANAEROBIC Blood Culture results may not be optimal due to an inadequate volume of blood received in culture bottles   Culture   Final    NO GROWTH 4 DAYS Performed at [facility], [address] [place], [place] [address]    Report Status PENDING  Incomplete  Blood Culture (routine x 2)     Status: None (Preliminary result)   Collection Time: 09/22/24 10:36 PM   Specimen: BLOOD  Result Value Ref Range Status   Specimen Description BLOOD LEFT ARM  Final   Special Requests   Final    BOTTLES DRAWN AEROBIC AND ANAEROBIC Blood Culture results may not be optimal due to an inadequate volume of blood received in culture bottles   Culture   Final    NO GROWTH 4 DAYS Performed at [facility], [address] [place], [place] [address]    Report Status PENDING  Incomplete    Labs: CBC: Recent Labs  Lab 09/22/24 1509  09/23/24 0606 09/24/24 0626 09/25/24 0547 09/26/24 0457  WBC 16.6* 15.6* 15.7* 12.1* 13.2*  HGB 14.7 14.1 14.1 13.8 14.7  HCT 45.0 41.3 41.7 40.1 43.1  MCV 93.4 91.8 92.1 90.7 90.9  PLT 243 237 218 244 283   Basic Metabolic Panel: Recent Labs  Lab 09/22/24 1509 09/23/24 0606 09/24/24 0626 09/25/24 0547 09/26/24 0457  NA 138 139 136 135 134*  K 3.6 3.6 3.8 3.6 3.3*  CL 102 103 104 104 102  CO2 24 20* 23 23 21*  GLUCOSE 200* 164* 152* 132* 153*  BUN 15 13 12 15 16   CREATININE 1.08 1.01 0.93 0.90 0.83  CALCIUM  9.4 8.7* 8.6* 8.7* 8.8*   Liver Function Tests: Recent Labs  Lab 09/22/24 1816 09/23/24 0606 09/24/24 0626 09/25/24 0547 09/26/24 0457  AST 301* 345* 166* 74* 119*  ALT 182* 347* 367* 236* 226*  ALKPHOS 101 102 130* 169* 255*  BILITOT 0.9 1.1 1.8* 1.5* 3.9*  PROT 7.9 7.2 7.2  7.6 7.8  ALBUMIN 4.3 3.7 3.8 3.5 3.5   CBG: Recent Labs  Lab 09/25/24 0852 09/25/24 1159 09/25/24 1711 09/25/24 2113 09/26/24 0823  GLUCAP 140* 124* 189* 167* 142*    Discharge time spent: greater than 30 minutes.  Signed: AIDA CHO, MD [facility] 09/26/2024

## 2024-09-26 NOTE — Plan of Care (Signed)
  Problem: Clinical Measurements: Goal: Ability to maintain clinical measurements within normal limits will improve Outcome: Progressing   Problem: Clinical Measurements: Goal: Respiratory complications will improve Outcome: Progressing   Problem: Clinical Measurements: Goal: Cardiovascular complication will be avoided Outcome: Progressing   

## 2024-09-27 ENCOUNTER — Telehealth: Payer: Self-pay | Admitting: *Deleted

## 2024-09-27 LAB — CULTURE, BLOOD (ROUTINE X 2)
Culture: NO GROWTH
Culture: NO GROWTH

## 2024-09-27 NOTE — Transitions of Care (Post Inpatient/ED Visit) (Signed)
   09/27/2024  Name: Benjamin Medina MRN: [id number] DOB: Sep 03, 1951  Today's TOC FU Call Status: Today's TOC FU Call Status:: Unsuccessful Call (1st Attempt) Unsuccessful Call (1st Attempt) Date: 09/27/24  Attempted to reach the patient regarding the most recent Inpatient/ED visit.  Follow Up Plan: Additional outreach attempts will be made to reach the patient to complete the Transitions of Care (Post Inpatient/ED visit) call.   Mliss Creed [facility], BSN RN Care Manager/ Transition of Care [place]/ [facility] [phone]

## 2024-09-28 ENCOUNTER — Telehealth: Payer: Self-pay

## 2024-09-28 NOTE — Transitions of Care (Post Inpatient/ED Visit) (Signed)
   09/28/2024  Name: Benjamin Medina MRN: [id number] DOB: February 29, 1951  Today's TOC FU Call Status: Today's TOC FU Call Status:: Unsuccessful Call (2nd Attempt) Unsuccessful Call (2nd Attempt) Date: 09/28/24  Attempted to reach the patient regarding the most recent Inpatient/ED visit.  Follow Up Plan: Additional outreach attempts will be made to reach the patient to complete the Transitions of Care (Post Inpatient/ED visit) call.   Shona Prow RN, CCM [place]  [facility] RN Care Manager [phone]

## 2024-09-29 ENCOUNTER — Telehealth: Payer: Self-pay

## 2024-09-29 DIAGNOSIS — Z1330 Encounter for screening examination for mental health and behavioral disorders, unspecified: Secondary | ICD-10-CM | POA: Diagnosis not present

## 2024-09-29 DIAGNOSIS — Z87438 Personal history of other diseases of male genital organs: Secondary | ICD-10-CM | POA: Diagnosis not present

## 2024-09-29 DIAGNOSIS — Z1331 Encounter for screening for depression: Secondary | ICD-10-CM | POA: Diagnosis not present

## 2024-09-29 DIAGNOSIS — R7989 Other specified abnormal findings of blood chemistry: Secondary | ICD-10-CM | POA: Diagnosis not present

## 2024-09-29 DIAGNOSIS — E119 Type 2 diabetes mellitus without complications: Secondary | ICD-10-CM | POA: Diagnosis not present

## 2024-09-29 NOTE — Transitions of Care (Post Inpatient/ED Visit) (Signed)
   09/29/2024  Name: Benjamin Medina MRN: [id number] DOB: [birth year]  Today's TOC FU Call Status: Today's TOC FU Call Status:: Unsuccessful Call (3rd Attempt) Unsuccessful Call (3rd Attempt) Date: 09/29/24  Attempted to reach the patient regarding the most recent Inpatient/ED visit.  Follow Up Plan: No further outreach attempts will be made at this time. We have been unable to contact the patient.  Emir Nack J. Ernestine Rohman RN, MSN [facility], [facility] RN Care Manager Direct Dial: [phone]  Fax: [phone] Website: [email or web address]

## 2024-10-04 ENCOUNTER — Telehealth: Payer: Self-pay

## 2024-10-04 NOTE — Telephone Encounter (Signed)
 Pt's wife called the triage line stating they will need to reschedule next week appt since they will be out of town. Pt's wife asked if they could be seen this week due to pt's increased urinary frequency, pt schedule with [facility] this Thursday pt's wife voiced understanding

## 2024-10-05 ENCOUNTER — Telehealth: Payer: Self-pay

## 2024-10-05 NOTE — Telephone Encounter (Signed)
 Pt's wife calls triage line and questions if PSA and UA need to be completed prior to patient's upcoming visit. Advised with that PSA at this time could result in false elevation if the patient is having signs of a UTI. Advised that UA would be collected at the time of visit if provider deems that appropriate. Wife voiced understanding.

## 2024-10-06 NOTE — Progress Notes (Unsigned)
 10/07/2024 2:06 PM   Benjamin Medina [birth year] [id number]  Referring provider: Justus Leita DEL, MD [address] [place],  [place] [address]  Urological history: 1. Prostate cancer - PSA (03/2024) 1.6  - low risk prostate cancer found on HoLEP  2. BPH with LU TS - HoLEP (05/2024)   3. High risk hematuria  - former smoker - contrast CT (08/2024) Bosniak I and II cysts, small right bladder diverticulum  - cysto (03/2024) massive median lobe   Chief Complaint  Patient presents with   Urinary Frequency    HPI: Benjamin Medina is a 73 y.o. man who presents today for increased urinary frequency with his wife, Verneita.   Previous records reviewed.  The wife also contributes to history of present illness.  She expresses her frustration with his recent hospitalization as they did not find a distinct cause for his symptoms.  She also was seen by a interim PCP and she felt she did not get any further information after seeing them as well.  She does have a follow-up scheduled for her husband next month with their new PCP.  She is wanting his blood work repeated today to ensure that it is continuing a downward trend.  He was recently hospitalized for abdominal pain.  I PSS 7/4  He states that since his HoLEP in June, he has been having urinary urgency and urge incontinence.  He is also having daytime urinary frequency and nocturia x 3-4.  He was recently admitted to the hospital for hypertension and abdominal pain.  He was found to have lactic acidosis, elevated LFT and hypertensive urgency.  He was given IV antibiotics in the hospital.  There was no cause found for his symptoms.    He continues to experience heart burn every evening.  His wife states the heartburns started about a week or 2 before the admission.  Patient denies any modifying or aggravating factors.  Patient denies any recent UTI's, gross hematuria, dysuria or suprapubic/flank pain.  Patient denies any  fevers, chills, nausea or vomiting.    UA bland   PVR 15 mL   Serum creatinine (09/2024) 1.1, eGFR 71  Hemoglobin A1c (03/2024) 6.3   Diuretics: hydrochlorothiazide    He states that he has been reducing his fluids at night and has not found that helpful in treating his nocturia.  He also denies any pedal edema.  He states he has been tested for sleep apnea twice and it has been negative.   PMH: Past Medical History:  Diagnosis Date   Acute DM type 2 causing complication (HCC)    Arthritis    Auditory impairment    BPH (benign prostatic hyperplasia)    BPH with urinary obstruction    Contracture of hand    PALMAR FASCIA   COVID-19 04/14/2024   Dyspnea    since having covid in april 2025   History of hepatitis C    treated back in 2020   Hyperlipidemia    Hypertension    Psoriasis     Surgical History: Past Surgical History:  Procedure Laterality Date   BLEPHAROPLASTY Bilateral 12/30/2014   COLONOSCOPY WITH PROPOFOL  N/A 05/01/2016   Procedure: COLONOSCOPY WITH PROPOFOL ;  Surgeon: Reyes LELON Cota, MD;  Location: [facility];  Service: Endoscopy;  Laterality: N/A;   HOLEP-LASER ENUCLEATION OF THE PROSTATE WITH MORCELLATION N/A 06/11/2024   Procedure: ENUCLEATION, PROSTATE, USING LASER, WITH MORCELLATION;  Surgeon: Francisca Redell BROCKS, MD;  Location: [facility];  Service:  Urology;  Laterality: N/A;   KNEE ARTHROSCOPY Left 01/14/2019   Procedure: ARTHROSCOPY KNEE WITH DEBRIDEMENT AND PARTIAL MEDIAL MENISCECTOMY;  Surgeon: Edie Norleen PARAS, MD;  Location: [facility];  Service: Orthopedics;  Laterality: Left;   KNEE ARTHROSCOPY WITH MEDIAL MENISECTOMY Right 11/27/2016   Procedure: KNEE ARTHROSCOPY WITH MEDIAL MENISECTOMY;  Surgeon: Norleen PARAS Edie, MD;  Location: [facility];  Service: Orthopedics;  Laterality: Right;   PROSTATE SURGERY  06/11/2024   SINUS SURGERY WITH INSTATRAK     TOTAL KNEE ARTHROPLASTY Right 11/28/2022   Procedure: TOTAL KNEE ARTHROPLASTY;  Surgeon:  Edie Norleen PARAS, MD;  Location: [facility];  Service: Orthopedics;  Laterality: Right;   VASECTOMY      Home Medications:  Allergies as of 10/07/2024   No Known Allergies      Medication List        Accurate as of October 07, 2024  2:06 PM. If you have any questions, ask your nurse or doctor.          lisinopril  40 MG tablet Commonly known as: ZESTRIL  Take 1 tablet (40 mg total) by mouth daily.        Allergies: No Known Allergies  Family History: Family History  Problem Relation Age of Onset   Colon cancer Mother 48   Lymphoma Father     Social History:  reports that he quit smoking about 38 years ago. His smoking use included cigarettes. He has never used smokeless tobacco. He reports that he does not currently use alcohol after a past usage of about 4.0 standard drinks of alcohol per week. He reports that he does not use drugs.  ROS: Pertinent ROS in HPI  Physical Exam: BP 110/69   Pulse 71   Ht 6' 3 (1.905 m)   Wt (!) 310 lb (140.6 kg)   BMI 38.75 kg/m   Constitutional:  Well nourished. Alert and oriented, No acute distress. HEENT:  AT, moist mucus membranes.  Trachea midline Cardiovascular: No clubbing, cyanosis, or edema. Respiratory: Normal respiratory effort, no increased work of breathing. Neurologic: Grossly intact, no focal deficits, moving all 4 extremities. Psychiatric: Normal mood and affect.  Laboratory Data: See HPI and Epic I have reviewed the labs.   Pertinent Imaging:  10/07/24 13:13  Scan Result 15ml    Assessment & Plan:    1. Urinary frequency - UA bland - urine culture pending - PVR demonstrates adequate emptying - start Gemtesa 75 mg at bedtime, # 7 samples given   2. Prostate cancer - will obtain a PSA once acute symptoms are resolved   3. Elevated LFT's/heartburn - Encouraged her to reach out to her PCP to see if she can have her husband take any OTC heartburn medication - I will go ahead and recheck a CMP and CBC  today at wife's behest  Return for Follow up pending labs.  These notes generated with voice recognition software. I apologize for typographical errors.  CLOTILDA HELON RIGGERS  [facility] [address]  [address] [place], [place] [address] [phone]

## 2024-10-07 ENCOUNTER — Ambulatory Visit (INDEPENDENT_AMBULATORY_CARE_PROVIDER_SITE_OTHER): Admitting: Urology

## 2024-10-07 ENCOUNTER — Encounter: Payer: Self-pay | Admitting: Urology

## 2024-10-07 VITALS — BP 110/69 | HR 71 | Ht 75.0 in | Wt 310.0 lb

## 2024-10-07 DIAGNOSIS — R350 Frequency of micturition: Secondary | ICD-10-CM

## 2024-10-07 DIAGNOSIS — R7989 Other specified abnormal findings of blood chemistry: Secondary | ICD-10-CM | POA: Diagnosis not present

## 2024-10-07 DIAGNOSIS — C61 Malignant neoplasm of prostate: Secondary | ICD-10-CM | POA: Diagnosis not present

## 2024-10-07 DIAGNOSIS — R1084 Generalized abdominal pain: Secondary | ICD-10-CM | POA: Diagnosis not present

## 2024-10-07 LAB — MICROSCOPIC EXAMINATION

## 2024-10-07 LAB — URINALYSIS, COMPLETE
Bilirubin, UA: NEGATIVE
Glucose, UA: NEGATIVE
Ketones, UA: NEGATIVE
Leukocytes,UA: NEGATIVE
Nitrite, UA: NEGATIVE
Protein,UA: NEGATIVE
RBC, UA: NEGATIVE
Specific Gravity, UA: 1.02 (ref 1.005–1.030)
Urobilinogen, Ur: 0.2 mg/dL (ref 0.2–1.0)
pH, UA: 6 (ref 5.0–7.5)

## 2024-10-07 LAB — BLADDER SCAN AMB NON-IMAGING

## 2024-10-07 NOTE — Patient Instructions (Addendum)
 Ask your PCP if you can take omeprazole for your nightly symptoms of heartburn and pain into your left arm

## 2024-10-08 LAB — COMPREHENSIVE METABOLIC PANEL WITH GFR
ALT: 43 IU/L (ref 0–44)
AST: 26 IU/L (ref 0–40)
Albumin: 4.7 g/dL (ref 3.8–4.8)
Alkaline Phosphatase: 154 IU/L — ABNORMAL HIGH (ref 47–123)
BUN/Creatinine Ratio: 16 (ref 10–24)
BUN: 19 mg/dL (ref 8–27)
Bilirubin Total: 0.3 mg/dL (ref 0.0–1.2)
CO2: 20 mmol/L (ref 20–29)
Calcium: 10.4 mg/dL — ABNORMAL HIGH (ref 8.6–10.2)
Chloride: 98 mmol/L (ref 96–106)
Creatinine, Ser: 1.22 mg/dL (ref 0.76–1.27)
Globulin, Total: 3 g/dL (ref 1.5–4.5)
Glucose: 100 mg/dL — ABNORMAL HIGH (ref 70–99)
Potassium: 5.2 mmol/L (ref 3.5–5.2)
Sodium: 138 mmol/L (ref 134–144)
Total Protein: 7.7 g/dL (ref 6.0–8.5)
eGFR: 63 mL/min/1.73 (ref >59)

## 2024-10-08 LAB — CBC WITH DIFFERENTIAL/PLATELET
Basophils Absolute: 0.1 x10E3/uL (ref 0.0–0.2)
Basos: 1 %
EOS (ABSOLUTE): 0.3 x10E3/uL (ref 0.0–0.4)
Eos: 3 %
Hematocrit: 45.2 % (ref 37.5–51.0)
Hemoglobin: 14.6 g/dL (ref 13.0–17.7)
Immature Grans (Abs): 0 x10E3/uL (ref 0.0–0.1)
Immature Granulocytes: 0 %
Lymphocytes Absolute: 2.2 x10E3/uL (ref 0.7–3.1)
Lymphs: 24 %
MCH: 30.7 pg (ref 26.6–33.0)
MCHC: 32.3 g/dL (ref 31.5–35.7)
MCV: 95 fL (ref 79–97)
Monocytes Absolute: 0.8 x10E3/uL (ref 0.1–0.9)
Monocytes: 9 %
Neutrophils Absolute: 5.9 x10E3/uL (ref 1.4–7.0)
Neutrophils: 63 %
Platelets: 501 x10E3/uL — ABNORMAL HIGH (ref 150–450)
RBC: 4.75 x10E6/uL (ref 4.14–5.80)
RDW: 12.6 % (ref 11.6–15.4)
WBC: 9.3 x10E3/uL (ref 3.4–10.8)

## 2024-10-10 ENCOUNTER — Ambulatory Visit: Payer: Self-pay | Admitting: Urology

## 2024-10-12 ENCOUNTER — Ambulatory Visit: Admitting: Urology

## 2024-10-13 LAB — CULTURE, URINE COMPREHENSIVE

## 2024-10-15 ENCOUNTER — Telehealth: Payer: Self-pay | Admitting: Urology

## 2024-10-15 NOTE — Telephone Encounter (Signed)
 Patient would like his PSA drawn at a [facility] draw station in [place].  Will you get the order sent, so he can get this done.

## 2024-10-19 ENCOUNTER — Other Ambulatory Visit: Payer: Self-pay

## 2024-10-19 DIAGNOSIS — N138 Other obstructive and reflux uropathy: Secondary | ICD-10-CM

## 2024-10-20 ENCOUNTER — Other Ambulatory Visit: Payer: Self-pay | Admitting: Urology

## 2024-10-20 DIAGNOSIS — N401 Enlarged prostate with lower urinary tract symptoms: Secondary | ICD-10-CM | POA: Diagnosis not present

## 2024-10-20 DIAGNOSIS — N138 Other obstructive and reflux uropathy: Secondary | ICD-10-CM | POA: Diagnosis not present

## 2024-10-21 LAB — PSA: Prostate Specific Ag, Serum: 0.2 ng/mL (ref 0.0–4.0)

## 2024-10-22 ENCOUNTER — Ambulatory Visit: Payer: Self-pay | Admitting: Urology

## 2024-10-26 DIAGNOSIS — R918 Other nonspecific abnormal finding of lung field: Secondary | ICD-10-CM | POA: Diagnosis not present

## 2024-10-26 DIAGNOSIS — R9431 Abnormal electrocardiogram [ECG] [EKG]: Secondary | ICD-10-CM | POA: Diagnosis not present

## 2024-10-26 DIAGNOSIS — Z87891 Personal history of nicotine dependence: Secondary | ICD-10-CM | POA: Diagnosis not present

## 2024-10-26 DIAGNOSIS — I10 Essential (primary) hypertension: Secondary | ICD-10-CM | POA: Diagnosis not present

## 2024-10-26 DIAGNOSIS — Z79899 Other long term (current) drug therapy: Secondary | ICD-10-CM | POA: Diagnosis not present

## 2024-10-26 DIAGNOSIS — E119 Type 2 diabetes mellitus without complications: Secondary | ICD-10-CM | POA: Diagnosis not present

## 2024-10-26 DIAGNOSIS — R7303 Prediabetes: Secondary | ICD-10-CM | POA: Diagnosis not present

## 2024-10-26 DIAGNOSIS — R0789 Other chest pain: Secondary | ICD-10-CM | POA: Diagnosis not present

## 2024-10-26 DIAGNOSIS — R079 Chest pain, unspecified: Secondary | ICD-10-CM | POA: Diagnosis not present

## 2024-10-26 DIAGNOSIS — Z96651 Presence of right artificial knee joint: Secondary | ICD-10-CM | POA: Diagnosis not present

## 2024-10-26 DIAGNOSIS — R911 Solitary pulmonary nodule: Secondary | ICD-10-CM | POA: Diagnosis not present

## 2024-10-26 DIAGNOSIS — R0600 Dyspnea, unspecified: Secondary | ICD-10-CM | POA: Diagnosis not present

## 2024-10-26 DIAGNOSIS — I209 Angina pectoris, unspecified: Secondary | ICD-10-CM | POA: Diagnosis not present

## 2024-10-26 DIAGNOSIS — R9439 Abnormal result of other cardiovascular function study: Secondary | ICD-10-CM | POA: Diagnosis not present

## 2024-10-26 DIAGNOSIS — I2089 Other forms of angina pectoris: Secondary | ICD-10-CM | POA: Diagnosis not present

## 2024-10-26 DIAGNOSIS — K219 Gastro-esophageal reflux disease without esophagitis: Secondary | ICD-10-CM | POA: Diagnosis not present

## 2024-10-26 DIAGNOSIS — E785 Hyperlipidemia, unspecified: Secondary | ICD-10-CM | POA: Diagnosis not present

## 2024-10-26 DIAGNOSIS — I517 Cardiomegaly: Secondary | ICD-10-CM | POA: Diagnosis not present

## 2024-10-29 ENCOUNTER — Telehealth: Payer: Self-pay | Admitting: *Deleted

## 2024-10-29 NOTE — Transitions of Care (Post Inpatient/ED Visit) (Signed)
   10/29/2024  Name: Benjamin Medina MRN: [id number] DOB: 1951/08/11  Today's TOC FU Call Status: Today's TOC FU Call Status:: Successful TOC FU Call Completed TOC FU Call Complete Date: 10/29/24 Patient's Name and Date of Birth confirmed.  Transition Care Management Follow-up Telephone Call Date of Discharge: 10/28/24 Discharge Facility: Other ([facility]) Name of Other ([facility]) Discharge Facility: [facility] Type of Discharge: Inpatient Admission Primary Inpatient Discharge Diagnosis:: Chest pain: MI ruled out: GERD How have you been since you were released from the hospital?: Better (No longer with  for any of my healthcare- have been very dissatisfied, especially after that hospital visit at [facility].  I have found [facility] to be far superior, and our whole family has switched to [facility] services for all of our health care) Any questions or concerns?: No  Patient reports has switched PCP to [facility]: Updated PCP in EHR to reflect patient's report-- Full TOC call/ assessments deferred accordingly  Items Reviewed:    Medications Reviewed Today: Medications Reviewed Today     Reviewed by Khyli Swaim M, RN (Registered Nurse) on 10/29/24 at [phone]  Med List Status: <None>   Medication Order Taking? Sig Documenting Provider Last Dose Status Informant  lisinopril  (ZESTRIL ) 40 MG tablet [id number]  Take 1 tablet (40 mg total) by mouth daily. Justus Leita DEL, MD  Active Pharmacy Records            Home Care and Equipment/Supplies:    Functional Questionnaire:    Follow up appointments reviewed:     Pls call/ message for questions,  Brigg [place] Mckinney Danelia Snodgrass, RN, BSN, CCRN Alumnus RN Care Manager  Transitions of Care  [facility] [phone]: direct office

## 2024-11-01 DIAGNOSIS — R9439 Abnormal result of other cardiovascular function study: Secondary | ICD-10-CM | POA: Diagnosis not present

## 2024-11-01 DIAGNOSIS — E119 Type 2 diabetes mellitus without complications: Secondary | ICD-10-CM | POA: Diagnosis not present

## 2024-11-01 DIAGNOSIS — I200 Unstable angina: Secondary | ICD-10-CM | POA: Diagnosis not present

## 2024-11-01 DIAGNOSIS — Z87891 Personal history of nicotine dependence: Secondary | ICD-10-CM | POA: Diagnosis not present

## 2024-11-01 DIAGNOSIS — I25118 Atherosclerotic heart disease of native coronary artery with other forms of angina pectoris: Secondary | ICD-10-CM | POA: Diagnosis not present

## 2024-11-01 DIAGNOSIS — I10 Essential (primary) hypertension: Secondary | ICD-10-CM | POA: Diagnosis not present

## 2024-11-01 DIAGNOSIS — R911 Solitary pulmonary nodule: Secondary | ICD-10-CM | POA: Diagnosis not present

## 2024-11-01 DIAGNOSIS — R7303 Prediabetes: Secondary | ICD-10-CM | POA: Diagnosis not present

## 2024-11-01 DIAGNOSIS — I25110 Atherosclerotic heart disease of native coronary artery with unstable angina pectoris: Secondary | ICD-10-CM | POA: Diagnosis not present

## 2024-11-01 DIAGNOSIS — Z8619 Personal history of other infectious and parasitic diseases: Secondary | ICD-10-CM | POA: Diagnosis not present

## 2024-11-01 DIAGNOSIS — Z7982 Long term (current) use of aspirin: Secondary | ICD-10-CM | POA: Diagnosis not present

## 2024-11-01 DIAGNOSIS — Z7902 Long term (current) use of antithrombotics/antiplatelets: Secondary | ICD-10-CM | POA: Diagnosis not present

## 2024-11-01 DIAGNOSIS — Z7984 Long term (current) use of oral hypoglycemic drugs: Secondary | ICD-10-CM | POA: Diagnosis not present

## 2024-11-02 DIAGNOSIS — I200 Unstable angina: Secondary | ICD-10-CM | POA: Diagnosis not present

## 2024-11-02 DIAGNOSIS — Z7984 Long term (current) use of oral hypoglycemic drugs: Secondary | ICD-10-CM | POA: Diagnosis not present

## 2024-11-02 DIAGNOSIS — E119 Type 2 diabetes mellitus without complications: Secondary | ICD-10-CM | POA: Diagnosis not present

## 2024-11-02 DIAGNOSIS — Z7982 Long term (current) use of aspirin: Secondary | ICD-10-CM | POA: Diagnosis not present

## 2024-11-02 DIAGNOSIS — I10 Essential (primary) hypertension: Secondary | ICD-10-CM | POA: Diagnosis not present

## 2024-11-02 DIAGNOSIS — Z8619 Personal history of other infectious and parasitic diseases: Secondary | ICD-10-CM | POA: Diagnosis not present

## 2024-11-02 DIAGNOSIS — Z87891 Personal history of nicotine dependence: Secondary | ICD-10-CM | POA: Diagnosis not present

## 2024-11-02 DIAGNOSIS — R9439 Abnormal result of other cardiovascular function study: Secondary | ICD-10-CM | POA: Diagnosis not present

## 2024-11-02 DIAGNOSIS — R911 Solitary pulmonary nodule: Secondary | ICD-10-CM | POA: Diagnosis not present

## 2024-11-02 DIAGNOSIS — Z7902 Long term (current) use of antithrombotics/antiplatelets: Secondary | ICD-10-CM | POA: Diagnosis not present

## 2024-11-02 DIAGNOSIS — I25110 Atherosclerotic heart disease of native coronary artery with unstable angina pectoris: Secondary | ICD-10-CM | POA: Diagnosis not present

## 2024-11-11 DIAGNOSIS — Z0189 Encounter for other specified special examinations: Secondary | ICD-10-CM | POA: Diagnosis not present

## 2024-11-11 DIAGNOSIS — K219 Gastro-esophageal reflux disease without esophagitis: Secondary | ICD-10-CM | POA: Diagnosis not present

## 2024-11-11 DIAGNOSIS — Z95820 Peripheral vascular angioplasty status with implants and grafts: Secondary | ICD-10-CM | POA: Diagnosis not present

## 2024-11-11 DIAGNOSIS — E119 Type 2 diabetes mellitus without complications: Secondary | ICD-10-CM | POA: Diagnosis not present

## 2024-11-11 DIAGNOSIS — I77810 Thoracic aortic ectasia: Secondary | ICD-10-CM | POA: Diagnosis not present

## 2024-12-13 DIAGNOSIS — Z95820 Peripheral vascular angioplasty status with implants and grafts: Secondary | ICD-10-CM | POA: Diagnosis not present

## 2024-12-16 ENCOUNTER — Ambulatory Visit: Admitting: Internal Medicine

## 2025-03-30 ENCOUNTER — Encounter

## 2025-04-20 ENCOUNTER — Other Ambulatory Visit

## 2025-04-27 ENCOUNTER — Ambulatory Visit: Admitting: Urology
